# Patient Record
Sex: Female | Born: 1975 | Race: White | Hispanic: No | Smoking: Former smoker
Health system: Southern US, Community
[De-identification: ages and names within clinical notes are randomized; demographics above are authoritative.]

## PROBLEM LIST (undated history)

## (undated) VITALS — BP 92/54 | HR 70 | Temp 98.4°F | Resp 12 | Wt 153.0 lb

## (undated) DIAGNOSIS — F909 Attention-deficit hyperactivity disorder, unspecified type: Secondary | ICD-10-CM

## (undated) DIAGNOSIS — F419 Anxiety disorder, unspecified: Secondary | ICD-10-CM

## (undated) HISTORY — DX: Attention-deficit hyperactivity disorder, unspecified type: F90.9

## (undated) HISTORY — PX: EYE SURGERY: SHX253

## (undated) HISTORY — PX: NO PAST SURGERIES: SHX2092

## (undated) HISTORY — DX: Anxiety disorder, unspecified: F41.9

## (undated) MED ORDER — DEXMETHYLPHENIDATE HCL ER 35 MG OR CP24
EXTENDED_RELEASE_CAPSULE | ORAL | 0 refills | Status: AC
Start: 2020-07-23 — End: ?

## (undated) MED ORDER — AMPHETAMINE-DEXTROAMPHETAMINE 5 MG OR TABS
ORAL_TABLET | ORAL | 0 refills | Status: AC
Start: 2019-05-21 — End: ?

## (undated) MED ORDER — VYVANSE 70 MG OR CAPS
ORAL_CAPSULE | ORAL | 0 refills | Status: AC
Start: 2019-05-21 — End: ?

## (undated) MED ORDER — AMPHETAMINE-DEXTROAMPHETAMINE 5 MG OR TABS
ORAL_TABLET | ORAL | 0 refills | Status: AC
Start: 2020-07-23 — End: ?

## (undated) MED ORDER — PRENATAL RX OR TABS
ORAL_TABLET | ORAL | Status: DC
Start: 2000-06-07 — End: 2001-06-07

## (undated) MED ORDER — CHANTIX STARTING MONTH PAK 0.5 MG X 11 & 1 MG X 42 OR TBPK
ORAL_TABLET | ORAL | Status: AC
Start: 2012-04-08 — End: ?

## (undated) MED ORDER — AMPHETAMINE-DEXTROAMPHETAMINE 5 MG OR TABS
5.0000 mg | ORAL_TABLET | Freq: Every day | ORAL | 0 refills | Status: AC
Start: 2019-11-05 — End: ?

---

## 2000-06-07 ENCOUNTER — Encounter (INDEPENDENT_AMBULATORY_CARE_PROVIDER_SITE_OTHER): Payer: Self-pay | Admitting: Family Medicine

## 2000-06-07 ENCOUNTER — Ambulatory Visit (INDEPENDENT_AMBULATORY_CARE_PROVIDER_SITE_OTHER): Payer: BLUE CROSS/BLUE SHIELD | Admitting: Family Medicine

## 2000-06-07 LAB — CBC, DIFF,PLT
Absolute Eosinophil Count: 0.29 10*3/uL (ref 0–0.50)
Absolute Lymphocyte Count: 1.94 10*3/uL (ref 1.00–4.80)
Basophils: 0.11 10*3/uL (ref 0–0.20)
Hematocrit: 38 % (ref 36–45)
Hemoglobin: 13 g/dL (ref 11.5–15.5)
MCH: 30.9 pg (ref 27.3–33.6)
MCHC: 34.2 g/dL (ref 32.3–35.7)
MCV: 91 fL (ref 81–98)
Monocytes: 0.46 10*3/uL (ref 0–0.80)
Neutrophils: 2.92 10*3/uL (ref 1.80–7.00)
Platelet Count: 220 10*3/uL (ref 150–400)
Platelet Morphology: NORMAL
RBC: 4.19 mil/uL (ref 3.80–5.00)
WBC: 5.72 10*3/uL (ref 4.3–10.0)

## 2000-06-07 LAB — CHOLESTEROL (TOTAL & HDL)
Cholesterol/HDL Ratio: 3
HDL Cholesterol: 51 mg/dL (ref >40)
Total Cholesterol: 152 mg/dL (ref <200)

## 2000-06-07 LAB — PROTHROMBIN & PTT
Partial Thromboplastin Time: 36 s — ABNORMAL HIGH (ref 22–35)
Partial Thromboplastin X Mean: 1.2
Prothrombin INR: 1.2
Prothrombin Time Patient: 14.7 s (ref 10.7–15.6)

## 2000-06-07 NOTE — Progress Notes (Signed)
SUBJECTIVE:  Lisa Flynn is an 25 year old woman who presents for annual gyn exam.     Current concerns: Trying to get pregnant for 6 yrs  frequent bruising on LE's    GYN HISTORY:  Current contraception: Natural family planning  DES exposure: UNKNOWN  History of abnormal Pap smear: NO  Menstrual history: every 24 days  Family history of uterine or ovarian cancer: NO  Regular self breast exam: N/A  History of abnormal mammogram: NO  Family history of breast cancer: NO  History of abnormal lipids: NO  OB HISTORY: Obstetric History   The patient has not been asked about pregnancy.      There is no problem list on file for this patient.    Review of patient's past medical history indicates:       Comment: negative    Review of patient's past surgical history indicates:      Comment: negative    Review of patient's family history indicates:   Cancer NEG    Diabetes NEG    Heart NEG     Hypertension NEG       No current prescriptions on file.        Review of patient's allergies indicates no known allergies.    Social History   Marital Status: Married Spouse Name: Pavel    Years of Education: Number of children: 0     Social History Main Topics   Tobacco Use: Yes Packs/Day: .5 Years:     Alcohol Use: Yes    Comment: twice per week   Drug Use: Not Asked    Sexually Active: Yes Partners with: Female    Other Topics Concern   None on file    Social History Narrative   None on file        Review Of Systems  Ears/Nose/Throat: negative  Respiratory: negative  Cardiovascular: negative  Gastrointestinal: negative  Genitourinary: negative    OBJECTIVE:  Blood pressure 102/58, pulse 60, temperature 98.6, resp. rate 16, height 5\' 5"  (1.638m), weight 125 lbs (56.700 kg), last menstrual period 05/25/2000.  General appearance: healthy, alert, no distress  Skin: Skin color, texture, turgor normal. No rashes or concerning lesions.  Ears: External ears normal. Canals clear. TM's normal.  Nose/Sinuses: Nares normal. Septum midline.  Mucosa normal. No drainage or sinus tenderness.  Oropharynx: Lips, mucosa, and tongue normal. Teeth and gums normal.  Neck: Neck supple. No adenopathy. Thyroid symmetric, normal size, without nodules  Lungs:clear to auscultation  Heart: normal rate, regular rhythm and no murmurs, clicks, or gallops  Breasts: deferred  Abdomen: Abdomen soft, non-tender. BS normal. No masses or organomegaly  Pelvic: External genitalia normal, Vagina normal without discharge, Urethra without abnormality or discharge, cervix normal in appearance, no CMT, no bladder tenderness, uterus normal size, shape, and consistency, no adnexal masses or tenderness  Ext: Normal, without deformities, edema, or skin discoloration, radial and DP pulses 2+ bilaterally    ASSESSMENT:  Satisfactory annual gyn exam    ON:GEXBMWU 25 yr old     PLAN:  Pap smear: YES  Chlamydia screen: YES   Screening for other STD's including HIV: NO  Total nonfasting cholesterol: YES  Mammography: NO  TSH for hypothyroidism: NO  Colon Ca screening over 50y: Hemoccults: NO  Flexible Sigmoidoscopy NO    Immunizations:  Td: NO  Hepatitis B NO  MMR NO    Routine counseling ("yes" indicates topic was discussed):   Contraceptive needs: YES   Mentioned availability of  emergency contraception: NO   STD prevention: NO   MVI with folate if any chance will get pregnant: YES   Importance of regular dental care: NO   Cessation of tobacco use:YES   Low-fat, high-fiber diet: YES   Adequate Ca intake (1200-1500mg /day at this age): YES   Proper exercise: YES   Breast Self Exam teaching: NO   HRT discussion if perimenopausal: NO     Rx:  PNV    Return to clinic in one year for HCM exam.

## 2000-06-08 LAB — CHLAMYDIA BY LCR: Chlamydia Trachomatis DNA: NEGATIVE

## 2000-06-10 LAB — RUBELLA IMMUNE STATUS BY EIA
Rubella Antibody Level: >20 IU
Rubella Immune Status Result: POSITIVE

## 2000-06-13 LAB — CERVICAL CANCER SCREENING: Cytologic Impression: NEGATIVE

## 2001-05-01 ENCOUNTER — Ambulatory Visit (INDEPENDENT_AMBULATORY_CARE_PROVIDER_SITE_OTHER): Payer: PRIVATE HEALTH INSURANCE | Admitting: Family Medicine

## 2001-05-01 MED ORDER — NAPROSYN 500 MG OR TABS
ORAL_TABLET | ORAL | Status: DC
Start: 2001-05-01 — End: 2001-12-30

## 2001-05-01 MED ORDER — FLEXERIL 10 MG OR TABS
ORAL_TABLET | ORAL | Status: DC
Start: 2001-05-01 — End: 2001-12-30

## 2001-05-01 NOTE — Progress Notes (Signed)
SUBJECTIVE:  Three days of headache.  Constant.  From back of head, over the top.  Some posterior neck pain.   began wtih period three days ago.  Does not normally get headaches.      Denies nausea, vomiting, photophobia, parestheisa or weakness.  Does bend her head over a lot at work, working on Scientist, water quality at [employer].      No fever, chills, runn nose, cough, st.  No h/o migraine.  No FH of migraine.    There is no problem list on file for this patient.  Review of patient's past medical history indicates:                                                                    Comment: negative  OBJECTIVE:  Blood pressure 108/58, pulse 88, temperature 96.8, resp. rate 16, weight 129 lbs (58.514 kg), last menstrual period 04/27/2001.  Gen:NAD  HEENT: TMs clear, op clear, nasal mucosa pink and moist, conj. clear, perrl, eomi  neck: supple w/o adenopathy, she dose have some tight muscles posteriorly, FROM, no spinous process tenderness to palpation  card: RRR nomrg  lungs: CTA bilaterally  CN II-XII intact    ASSESSMENT:  Headache muscle tension    PLAN:  Trial of naprosyn, flexeril, stretching  fu one week if not improving.

## 2001-06-07 ENCOUNTER — Encounter (INDEPENDENT_AMBULATORY_CARE_PROVIDER_SITE_OTHER): Payer: Self-pay | Admitting: Family Medicine

## 2001-06-07 ENCOUNTER — Ambulatory Visit (INDEPENDENT_AMBULATORY_CARE_PROVIDER_SITE_OTHER): Payer: PRIVATE HEALTH INSURANCE | Admitting: Family Medicine

## 2001-06-07 MED ORDER — PRENATAL RX OR TABS
ORAL_TABLET | ORAL | Status: DC
Start: 2001-06-07 — End: 2007-06-14

## 2001-06-07 NOTE — Progress Notes (Signed)
SUBJECTIVE:  Lisa Flynn is an 26 year old woman who presents for annual gyn exam.     Current concerns: fertility referral    GYN HISTORY:  Current contraception: not needed  DES exposure: NO  History of abnormal Pap smear: NO  Menstrual history: monthly  Family history of uterine or ovarian cancer: NO  Regular self breast exam: NO  History of abnormal mammogram: NO  Family history of breast cancer: NO  History of abnormal lipids: NO  OB HISTORY: Obstetric History   The patient has not been asked about pregnancy.      There is no problem list on file for this patient.    Review of patient's past medical history indicates:     Comment: negative    Review of patient's past surgical history indicates:     Comment: negative    Review of patient's family history indicates:   Cancer NEG    Diabetes NEG    Heart NEG    Hypertension NEG       Current prescriptions:  NAPROSYN 500 MG OR TABS Take 1 tablet by mouth 2 times per day as needed for pain  FLEXERIL 10 MG OR TABS Take 1 tablet by mouth 3 times per day as needed for muscle pain  PRENATAL RX OR TABS 1 TABLET DAILY        Review of patient's allergies indicates not on file.    Social History   Marital Status: Married Spouse Name: Pavel    Years of Education: Number of children: 0     Social History Main Topics   Tobacco Use: Yes Packs/Day: .5 Years:    Alcohol Use: Yes    Comment: twice per week   Drug Use: Not Asked    Sexually Active: Yes Partners with: Female    Other Topics Concern   None on file    Social History Narrative   None on file        Review Of Systems  Ears/Nose/Throat: epistaxis  Respiratory: negative  Cardiovascular: negative  Gastrointestinal: negative  Genitourinary: negative    OBJECTIVE:  Blood pressure 117/60, pulse 86, temperature 99.8, temperature source Oral, resp. rate 20, weight 127 lbs (57.607 kg), last menstrual period 05/22/2001.  General appearance: healthy, alert, no distress  Skin: Skin color, texture, turgor normal. No rashes or  concerning lesions.  Ears: External ears normal. Canals clear. TM's normal.  Nose/Sinuses: Nares normal. Septum midline. Mucosa normal. No drainage or sinus tenderness.  Oropharynx: Lips, mucosa, and tongue normal. Teeth and gums normal.  Neck: Neck supple. No adenopathy. Thyroid symmetric, normal size, without nodules  Lungs:clear to auscultation  Heart: normal rate, regular rhythm and no murmurs, clicks, or gallops  Breasts: deferred  Abdomen: Abdomen soft, non-tender. BS normal. 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    PLAN:  Pap smear: YES  Chlamydia screen: NO   Screening for other STD's including HIV: NO  Total nonfasting cholesterol: NO  Mammography: NO  TSH for hypothyroidism: NO  Colon Ca screening over 50y: Hemoccults: NO  Flexible Sigmoidoscopy NO    Immunizations:  Td: NO  Hepatitis B NO  MMR NO    Routine counseling ("yes" indicates topic was discussed):   Contraceptive needs: YES   Mentioned availability of emergency contraception: NO   STD prevention: N/A   MVI with folate if any chance will get pregnant: YES   Importance of regular dental care: NO   Cessation of tobacco use:NO   Low-fat, high-fiber diet: YES   Adequate Ca intake (1200-1500mg /day at this age): YES   Proper exercise: YES   Breast Self Exam teaching: NO   HRT discussion if perimenopausal: NO     Rx:  pnv  referral to infertility    Return to clinic in one year for HCM exam.

## 2001-06-12 LAB — CERVICAL CANCER SCREENING: Cytologic Impression: NEGATIVE

## 2001-11-06 ENCOUNTER — Ambulatory Visit (INDEPENDENT_AMBULATORY_CARE_PROVIDER_SITE_OTHER): Payer: PRIVATE HEALTH INSURANCE | Admitting: Family Medicine

## 2001-11-06 VITALS — BP 108/64 | HR 80 | Temp 99.8°F | Resp 16 | Wt 122.0 lb

## 2001-11-06 MED ORDER — PHENERGAN 25 MG OR TABS
ORAL_TABLET | ORAL | Status: DC
Start: 2001-11-06 — End: 2001-12-30

## 2001-11-06 NOTE — Progress Notes (Signed)
SUBJECTIVE:  Patient woke up early this am having to vomit. She has vomited twice so far. Brown material. No frank blood. She feels mildly feverish, nauseated and dizzy. No vertigo. Diarrhea x one. No melena. She denies abdominal pain, st, cough, cp.     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  Review of patient's past surgical history indicates:      Comment: negative  Review of patient's family history indicates:   Cancer NEG    Diabetes NEG    Heart NEG    Hypertension NEG    Stroke Maternal Grandfather    Comment: stroke at 63    Social History   Marital Status: Married Spouse Name: Physiological scientist    Years of Education: Number of children: 0     Social History Main Topics   Tobacco Use: Yes Packs/Day: .5 Years:    Alcohol Use: Yes    Comment: twice per week   Drug Use: Not Asked    Sexually Active: Yes Partners with: Female    Other Topics Concern   None on file    Social History Narrative   None on file      OBJECTIVE:  Blood pressure 108/64, pulse 80, temperature 99.8, resp. rate 16, weight 122 lbs (55.339 kg), last menstrual period 10/30/2001. see extended vitals for orthostatics.  Gen:NAD  HEENT: TMS clear, op clear, conj.clear, op clear  neck: supple w/o adenopathy  card; RRR no mrg  lungs: CTA bilaterally  abd: soft, nontnder, no hsm  skin: no rash  rectal: heme negative    ASSESSMENT:  Probable viral gastroenteritis vs. food poisoning. Brown vomitus concerning for presence of blood but not orthostatic and otherwise hemodynamically stable    PLAN:  Phenergan for nausea  expect resolution of vomiting by tommorrow  call if worsening.  Patient left before we could get a cbc.

## 2001-12-30 ENCOUNTER — Ambulatory Visit (INDEPENDENT_AMBULATORY_CARE_PROVIDER_SITE_OTHER): Payer: PRIVATE HEALTH INSURANCE | Admitting: Family Medicine

## 2001-12-30 LAB — CBC, DIFF
% Basophils: 2 %
% Eosinophils: 3 %
% Lymphocytes: 25 %
% Monocytes: 9 %
% Neutrophils: 61 %
Absolute Eosinophil Count: 0.16 10*3/uL (ref 0–0.50)
Absolute Lymphocyte Count: 1.25 10*3/uL (ref 1.00–4.80)
Basophils: 0.08 10*3/uL (ref 0–0.20)
Hematocrit: 36 % (ref 36–45)
Hemoglobin: 12.1 g/dL (ref 11.5–15.5)
MCH: 31 pg (ref 27.3–33.6)
MCHC: 33.7 g/dL (ref 32.3–35.7)
MCV: 92 fL (ref 81–98)
Monocytes: 0.46 10*3/uL (ref 0–0.80)
Neutrophils: 3.14 10*3/uL (ref 1.80–7.00)
Platelet Count: 205 10*3/uL (ref 150–400)
RBC: 3.91 mil/uL (ref 3.80–5.00)
WBC: 5.09 10*3/uL (ref 4.3–10.0)

## 2001-12-30 LAB — THYROID SCREEN WITH TSH
T Uptake: 44 % (ref 28–54)
Thyroid Stimulating Hormone: 0.41 u[IU]/mL (ref 0.4–5.0)
Thyroxine (T4): 8.6 ug/dL (ref 4.8–10.8)

## 2001-12-30 MED ORDER — NAPROSYN 500 MG OR TABS
ORAL_TABLET | ORAL | Status: DC
Start: 2001-12-30 — End: 2003-01-17

## 2001-12-30 MED ORDER — FLEXERIL 10 MG OR TABS
ORAL_TABLET | ORAL | Status: DC
Start: 2001-12-30 — End: 2002-01-08

## 2001-12-30 MED ORDER — OMEPRAZOLE CPCR 20 MG OR
EXTENDED_RELEASE_CAPSULE | ORAL | Status: DC
Start: 2001-12-30 — End: 2006-02-02

## 2001-12-30 NOTE — Progress Notes (Signed)
Addended by: Jobe Igo B on: 12/30/2001 11:53:37 AM     Modules accepted: Orders    SUBJECTIVE:  Patient c/o the feeling like there is something in her throat x 2 weeks. She has difficulty swallowing liquids. Feels like they are going around a mass at the level of the adams apple. No difficulty with solids. NO vomiting, regurgitation, diarrhea.     No cough, or sob.   She does have a stuffy nose. NO sneezing.  She c/o intermittent heart burn.  She still gets one sided tension headaches few times per week but is still not interested in a daily medication.   She has lost 4 pounds in past three weeks.    She smokes 1 and a half packs in two days  Her mom had a thyroid condition    There is no previous medical history on file.    OBJECTIVE:  Blood pressure 102/60, pulse 60, temperature 97.8, resp. rate 16, weight 122 lbs (55.339 kg), last menstrual period 12/23/2001.  gen:NAD  HEENT: TMs cleawr, nasal mucosa pink and moist and slightly swollen on the left side, conj. clear, perrl, no proptosis, op clear  neck: supple, there is a mild fullness over the thyroid which feels slightly diffusely enlarged, no adenopathy    ASSESSMENT:  Dysphagia for liquids with possible thyroid enlargement. Need to r/o mass. Also cannot r/o GERD given intermittent heart burn symtpoms    PLAN:  Check below labs  schedule [place] thryoid  trial of omeprazole  f/u after scan.

## 2002-01-02 ENCOUNTER — Telehealth (INDEPENDENT_AMBULATORY_CARE_PROVIDER_SITE_OTHER): Payer: Self-pay | Admitting: Family Medicine

## 2002-01-02 NOTE — Telephone Encounter (Signed)
>>   Lisa SONNENBURG Tue Jan 02, 2002 5:19 PM  Informed pt of appt time. Pt is unable to go at that time--rescheduled pt to get U/S on 10.03.03 at 3:15pm.    >> [name] Tue Jan 02, 2002 4:41 PM  Pt states that she is returning Lisa's call and can be reached at [phone] and requests a call back. TSR tried Lisa's ext, no answer.    >> [facility] Tue Jan 02, 2002 2:35 PM  Left message for pt to call clinic. The latest appt I could schedule for her was at 4:00pm with a 3:45 check in time--[facility]--09.29.03    >> KIM F NESBITT Tue Jan 02, 2002 8:49 AM  Vevelyn Pat pt    GENERAL MESSAGE  ------------------  Main reason for the call: Pt states she's returning a call to the clinic regarding an appt for an [place] at [facility]. Pls call pt to advise.  Preferred phone number: [phone]  Call back expected: YES  Okay to leave a VM or msg with someone? YES

## 2002-01-10 ENCOUNTER — Encounter (INDEPENDENT_AMBULATORY_CARE_PROVIDER_SITE_OTHER): Payer: Self-pay | Admitting: Family Medicine

## 2002-01-10 NOTE — Telephone Encounter (Signed)
>>   TINA RUDDELL Wed Jan 10, 2002 11:44 AM  R/C and provided insurance info.      >> SIU LEE Wed Jan 10, 2002 11:12 AM  Left message for Maxine Glenn to call back.    >> Jen[place] Corner Wed Jan 10, 2002 10:50 AM  Vevelyn Pat pt    GENERAL MESSAGE  ------------------  Main reason for the call: Monica from [place] states she does not have pt current insurance information and is requesting a call back with that info.  Preferred phone number: [phone]  Call back expected: YES  Okay to leave a VM or msg with someone? YES

## 2002-01-15 ENCOUNTER — Telehealth (INDEPENDENT_AMBULATORY_CARE_PROVIDER_SITE_OTHER): Payer: Self-pay | Admitting: Family Medicine

## 2002-01-15 NOTE — Telephone Encounter (Signed)
>>   [name] [facility] Mon Jan 15, 2002 3:06 PM  Left detailed message regarding below on pt's cell phone.    >> Oda Kilts ZOX Jan 15, 2002 12:58 PM  Please let patient know that her [place] results are back. Shows just a mildly enlarged thyroid. Please have her schedule a f/u appt.

## 2002-01-22 ENCOUNTER — Ambulatory Visit (INDEPENDENT_AMBULATORY_CARE_PROVIDER_SITE_OTHER): Payer: PRIVATE HEALTH INSURANCE | Admitting: Family Medicine

## 2002-01-22 VITALS — BP 108/70 | HR 76 | Temp 98.8°F | Resp 16 | Wt 122.0 lb

## 2002-01-22 NOTE — Progress Notes (Signed)
Addended by: Jobe Igo B on: 01/23/2002 10:35:05 AM     Modules accepted: Orders    SUBJECTIVE:  Patient returns today for f/u of feeling of a lump in her throat. She states that when she swallows saliva, she feels like it is temporarily stuck and takes a little longer to go down. She does not feel this when drinking liquids or swallowing solids. NO GERD symptoms. NO regurgitation.    No fever, chills, weight loss,palpitations, dizziness    she has not taken the omeprazole    OBJECTIVE:  Blood pressure 108/70, pulse 76, temperature 98.8, resp. rate 16, weight 122 lbs (55.339 kg), last menstrual period 01/15/2002.  Gen:NAD  Thyroid gland still appears diffusely enlarged  [place] confirms    ASSESSMENT:  Goiter but question whether responsible for feeling in throat    PLAN:  Check free t4 and free t3  she will try the prilosec for two weeks and return then to see if there is any subclinical reflux.

## 2002-01-23 LAB — T4, FREE: Thyroxine (Free): 1.2 ng/dL (ref 0.8–1.8)

## 2002-01-25 LAB — T3 (FREE): T3 (Free): 3.6 pg/mL (ref 2.2–4.0)

## 2002-01-26 ENCOUNTER — Telehealth (INDEPENDENT_AMBULATORY_CARE_PROVIDER_SITE_OTHER): Payer: Self-pay | Admitting: Family Medicine

## 2002-02-07 ENCOUNTER — Ambulatory Visit (INDEPENDENT_AMBULATORY_CARE_PROVIDER_SITE_OTHER): Payer: PRIVATE HEALTH INSURANCE | Admitting: Family Medicine

## 2002-02-07 VITALS — BP 114/62 | HR 72 | Temp 98.0°F | Resp 16 | Wt 122.0 lb

## 2002-02-07 LAB — PR U/A AUTO W/MICRO, ONSITE
Bilirubin (Indirect): NEGATIVE
Casts, URN: NEGATIVE /LPF
Crystals, URN: NEGATIVE /LPF
Epithelial Cells, URN: NEGATIVE /LPF
Glucose: NEGATIVE mg/dL
Ketones, URN: NEGATIVE mg/dL
Nitrite, URN: NEGATIVE
Protein: NEGATIVE mg/dL
Specific Gravity, URN: <1.005 (ref 1.005–1.030)
Urobilinogen, URN: NORMAL E.U./dL
pH, Whole Blood: 5 (ref 5.0–8.0)

## 2002-02-07 LAB — PR URINE PREGNANCY TEST HCG, ONSITE: Prelim. Pregnancy (HCG), SRM: NEGATIVE

## 2002-02-07 MED ORDER — PYRIDIUM 200 MG OR TABS
ORAL_TABLET | ORAL | Status: DC
Start: 2002-02-07 — End: 2002-02-12

## 2002-02-07 MED ORDER — MACROBID 100 MG OR CAPS
ORAL_CAPSULE | ORAL | Status: DC
Start: 2002-02-07 — End: 2002-02-13

## 2002-02-07 NOTE — Progress Notes (Signed)
This 26 year old female presents for evaluation of pain and blood in urine at the end of urination. No frqequency. She is late with her period and also says her one boyfriend is "not trust worthy".  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  outpatient prescriptions as of 02/07/02:  NAPROSYN 500 MG OR TABS Take 1 tablet by mouth 2 times per day as needed for pain,,,  OMEPRAZOLE CPCR 20 MG OR Take 1 capsule by mouth every day,,,  PRENATAL RX OR TABS 1 TABLET DAILY,,,        Review of patient's allergies indicates no known allergies.  History sections of chart reviewed and updated today: YES    Recent antibiotics: No   Previous cystitis: No  Previous pyelonephritis: Yes: a few times  Other bladder/kidney problems: No  Diabetes: No  Date of last STD check: Wants testing today       OBJECTIVE:  Appearance:no acute distress  CVAT: No  Abdomen: exam deferred  Pelvic exam: deferred    Labs: Stat UA ordered. Gc/Chlam at patient request.    Patient education: UTI symptoms/etiology/treatment discussed.    Risks and side effects of medication(s) were discussed. Pt advised to read written information provided by the pharmacist:YES

## 2002-02-09 LAB — URINE C/S

## 2002-02-09 LAB — GC&CHLAM NUCLEIC ACID DETECTN

## 2002-02-12 ENCOUNTER — Telehealth (INDEPENDENT_AMBULATORY_CARE_PROVIDER_SITE_OTHER): Payer: PRIVATE HEALTH INSURANCE | Admitting: Family Medicine

## 2002-02-12 LAB — PR U/A MICRO ONLY, ONSITE
Bacteria: NEGATIVE /HPF
Casts, URN: NEGATIVE /LPF
Crystals, URN: NEGATIVE /LPF
Other: NEGATIVE

## 2002-02-12 MED ORDER — PYRIDIUM 200 MG OR TABS
ORAL_TABLET | ORAL | Status: DC
Start: 2002-02-12 — End: 2002-02-14

## 2002-02-12 MED ORDER — CIPROFLOXACIN TABS 500 MG OR
ORAL_TABLET | ORAL | Status: DC
Start: 2002-02-12 — End: 2002-02-25

## 2002-02-12 NOTE — Telephone Encounter (Signed)
>>   Lisa Flynn Mon Feb 12, 2002 1:32 PM  still having full stream dysuria. NO frequency, urgency back pain, nausea, fever. Change to cipro, refill macrobid, cx urine. f/u in 3 days if not improving.    >> [name] [facility] Mon Feb 12, 2002 1:24 PM  Pt complaining of pain with urination. Wanting refill of Pyridium. Per Dr. Vevelyn Pat need ua on pt. Pt left urine.

## 2002-02-14 LAB — URINE C/S

## 2003-01-17 ENCOUNTER — Ambulatory Visit (INDEPENDENT_AMBULATORY_CARE_PROVIDER_SITE_OTHER): Payer: PRIVATE HEALTH INSURANCE | Admitting: Family Practice

## 2003-01-17 VITALS — BP 112/60 | HR 72 | Temp 99.8°F | Resp 16 | Wt 117.0 lb

## 2003-01-17 MED ORDER — NAPROSYN 500 MG OR TABS
ORAL_TABLET | ORAL | Status: DC
Start: 2003-01-17 — End: 2003-06-12

## 2003-01-17 NOTE — Progress Notes (Signed)
Addended by: Eustace Pen on: 01/21/2003 9:00:31 AM     Modules accepted: Orders    Lisa Flynn is a 28 yo female here for evaluation of nose bleeds.     S: Lisa Flynn has a two year hx of frequent nose bleeds. Had been occurring less frequently until this past month when they began to occur nearly daily again. Yesterday it happened four times and today two. Each bleed occurs spontaneously without any known precipitant. Usually bleeds for about 10-20 minutes at a time. She puts pressure on the nares and leans forward to staunch the bleeding. This has always worked eventually. She denies congestion, cough, rhinorrhea, sore throat or ear pain. Has been feeling rather fatigued lately. Denies fever, chills, night sweats, weight loss, nausea.     Requests refill of Naprosyn which she takes for intermittent HA's.     There is no previous medical history on file.  Review of patient's past surgical history indicates:      Comment: negative    Review of patient's family history indicates:   Cancer NEG    Diabetes NEG    Heart NEG    Hypertension NEG    Stroke Maternal Grandfather    Comment: stroke at 62      O: General: Alert, NAD   Blood pressure 112/60, pulse 72, temperature 99.8, temperature source Oral, resp. rate 16, weight 117 lbs (53.071 kg).   HEENT: Pupils equal, reactive to light and accomodation. Sclerae and conjunctivae clear. Fundi benign. External ears and canals clear. TM's normal bilaterally. Nose with slight inflamation of nasal mucosa, no visible blood or crusts seen. Gums and dentition normal. No sinus tenderness. Oropharynx normal. Neck supple without palpable adenopathy. No thyroid enlargemant or discrete nodule.   LUNGS: The lungs are clear to auscultation.   CV: Regular rate and rhythm. S1 and S2 normal, no murmurs, clicks, gallops or rubs.    A: Recurrent epistaxis, no visible source upon exam today.    p: Refer to ENT. Check CBC to r/o anemia. Refilled Naprosyn.

## 2003-01-18 LAB — CBC, DIFF
Absolute Eosinophil Count: 0.06 10*3/uL (ref 0–0.50)
Absolute Lymphocyte Count: 2.88 10*3/uL (ref 1.00–4.80)
Hematocrit: 38 % (ref 36–45)
Hemoglobin: 12.8 g/dL (ref 11.5–15.5)
MCH: 29.9 pg (ref 27.3–33.6)
MCHC: 33.6 g/dL (ref 32.3–35.7)
MCV: 89 fL (ref 81–98)
Monocytes: 0.38 10*3/uL (ref 0–0.80)
Neutrophils: 2.94 10*3/uL (ref 1.80–7.00)
Platelet Count: 210 10*3/uL (ref 150–400)
RBC: 4.28 mil/uL (ref 3.80–5.00)
WBC: 6.26 10*3/uL (ref 4.3–10.0)

## 2003-06-12 ENCOUNTER — Telehealth (INDEPENDENT_AMBULATORY_CARE_PROVIDER_SITE_OTHER): Payer: Self-pay | Admitting: Family Medicine

## 2003-06-12 ENCOUNTER — Ambulatory Visit (INDEPENDENT_AMBULATORY_CARE_PROVIDER_SITE_OTHER): Payer: PRIVATE HEALTH INSURANCE | Admitting: Family Medicine

## 2003-06-12 MED ORDER — FLEXERIL 10 MG OR TABS
ORAL_TABLET | ORAL | Status: AC
Start: 2003-06-12 — End: 2003-06-21

## 2003-06-12 MED ORDER — NAPROSYN 500 MG OR TABS
ORAL_TABLET | ORAL | Status: DC
Start: 2003-06-12 — End: 2006-02-02

## 2003-06-12 NOTE — Progress Notes (Signed)
SUBJECTIVE:  Patient comes in requesting refill of naprosyn and flexeril for tension HAs. She uses the flexeril sparingly. She is stressed at home. Her husband in addicted to methamphetamine and refusing help. She is contemplating divorce.     OBJECTIVE:  BP 114/62   Pulse 74   Resp 16   Wt 120 lbs (54.432 kg)   LMP 05/18/2003  Gen:NAD  Neck: supple      ASSESSMENT:  Tension HA    PLAN:  Ok to continue naprosyn and nightly flexeril  f/u prn or if pattern worsening.

## 2003-06-12 NOTE — Telephone Encounter (Signed)
>>   DANIEL SULLIVAN Wed Jun 12, 2003 4:42 PM  Pt scheduled appt for today.    >> Oda Kilts Wed Jun 12, 2003 2:06 PM  should be seen    >> Marianne Sofia Wed Jun 12, 2003 2:01 PM  Please see below and advise.  Appt needed ?  Thank You.      >> Webb Laws Wed Jun 12, 2003 1:01 PM  Pt of Oda Kilts, MD.    REFILL CALL  PCP: Oda Kilts, MD  Prescribing Provider: Oda Kilts, MD  Medication(s) requested: Naprosyn (500 MG) and muscle relaxer   Dose taking now: see above   Reason for request: patient is out   Written prescription needed? NO  To Pharmacy Name: [facility]  Pharmacy location:[place] - across [address]  Pharmacy phone #:unknown by caller  Preferred phone #: [phone]  Okay leave VM or msg with someone?YES    TSR - Please inform patient that refills typically take 1-2 business days and to call their pharmacy to verify status. y

## 2003-06-28 ENCOUNTER — Ambulatory Visit (INDEPENDENT_AMBULATORY_CARE_PROVIDER_SITE_OTHER): Payer: PRIVATE HEALTH INSURANCE | Admitting: Family Medicine

## 2003-06-28 VITALS — BP 116/66 | HR 74 | Temp 98.8°F | Resp 16 | Wt 120.0 lb

## 2003-06-28 LAB — CBC, DIFF
% Basophils: 2 %
% Eosinophils: 6 %
% Lymphocytes: 22 %
% Monocytes: 7 %
% Neutrophils: 63 %
Absolute Eosinophil Count: 0.33 10*3/uL (ref 0–0.50)
Absolute Lymphocyte Count: 1.26 10*3/uL (ref 1.00–4.80)
Basophils: 0.13 10*3/uL (ref 0–0.20)
Hematocrit: 38 % (ref 36–45)
Hemoglobin: 12.7 g/dL (ref 11.5–15.5)
MCH: 30.2 pg (ref 27.3–33.6)
MCHC: 33.8 g/dL (ref 32.3–35.7)
MCV: 89 fL (ref 81–98)
Monocytes: 0.41 10*3/uL (ref 0–0.80)
Neutrophils: 3.65 10*3/uL (ref 1.80–7.00)
Platelet Count: 204 10*3/uL (ref 150–400)
RBC: 4.22 mil/uL (ref 3.80–5.00)
WBC: 5.78 10*3/uL (ref 4.3–10.0)

## 2003-06-28 MED ORDER — ALLEGRA 180 MG OR TABS
ORAL_TABLET | ORAL | Status: DC
Start: 2003-06-28 — End: 2006-02-02

## 2003-06-28 NOTE — Progress Notes (Signed)
S:Pt reports fatigue and dizziness for last week. Has been sleeping 12 hours/night, wakes up refreshed but is tired again early in the day. Dizziness is occurring multiple times/day with some double vision, this lasts a few seconds each time. With episode yesterday she felt faint, checked her blood sugar with mother's glucometer, value was 68. She has frontal HA X2 days for which pain meds have not helped. Reports decreased concentration, has found herself repeating statements to a co-worker. Has felt unco-ordinated also, with some falls. States mood, appetite good, denies stress at work or home. Reports some nasal congestion. NO fever, chills, SOB, CP, sore throat, itchy eyes,numbness, localized weakness.  O:  BP 116/66   Pulse 74   Temp (Src) 98.8 (Oral)   Resp 16   Wt 120 lbs (54.4kg)   LMP 06/18/2003  Gen: well-developed female, alert, interactive, NAD  HEENT: TMs clear,PERRLA, EOMI, nasal congestion with clear discharge, posterior oropharynx mildly erythematous, no maxillary or ethmoid sinus tenderness to palpation.  Neck: supple, no lymphadenopathy  CV: rrr, no mrg  Pulm: CTA bilaterally  Neuro: speech fluid, thought process logical and linear   CN II-XII grossly intact   sensation: intact to light touch throughout   Strength: 5/5 in all muscle groups   Gait normal, FTN normal  A: 1. viral illness vs anemia vs thyroid abnormality vs depression vs ?   2. allergic rhinitis  P: 1. if viral, this should abate within another week, labs drawn today, see below, return visit for lab results, further evaluation   2. Allegra 180mg  po qd during allergy season    Seen and evaluated with Rennis Harding MSIII

## 2003-06-29 LAB — COMPREHENSIVE METABOLIC PANEL
ALT (GPT): 15 U/L (ref 8–49)
AST (GOT): 20 U/L (ref 10–44)
Albumin: 4.4 g/dL (ref 3.5–5.2)
Alkaline Phosphatase (Total): 51 U/L (ref 25–100)
Anion Gap: 8 (ref 5–15)
Bilirubin (Total): 0.8 mg/dL (ref 0.2–1.3)
Calcium: 9.5 mg/dL (ref 8.9–10.2)
Carbon Dioxide, Total: 28 mEq/L (ref 22–32)
Chloride: 106 mEq/L (ref 98–108)
Creatinine: 0.8 mg/dL (ref 0.3–1.2)
Glucose: 82 mg/dL (ref 62–125)
Potassium: 4.2 mEq/L (ref 3.7–5.2)
Protein (Total): 7.2 g/dL (ref 6.0–8.2)
Sodium: 142 mEq/L (ref 136–145)
Urea Nitrogen: 9 mg/dL (ref 8–21)

## 2003-06-29 LAB — THYROID STIMULATING HORMONE: Thyroid Stimulating Hormone: 0.47 u[IU]/mL (ref 0.4–5.0)

## 2003-07-08 ENCOUNTER — Ambulatory Visit (INDEPENDENT_AMBULATORY_CARE_PROVIDER_SITE_OTHER): Payer: Self-pay | Admitting: Family Medicine

## 2004-06-01 ENCOUNTER — Ambulatory Visit (INDEPENDENT_AMBULATORY_CARE_PROVIDER_SITE_OTHER): Payer: PRIVATE HEALTH INSURANCE | Admitting: Family Medicine

## 2004-06-01 ENCOUNTER — Encounter (INDEPENDENT_AMBULATORY_CARE_PROVIDER_SITE_OTHER): Payer: Self-pay | Admitting: Family Medicine

## 2004-06-01 VITALS — BP 118/78 | HR 84 | Temp 98.2°F | Resp 16 | Ht 64.5 in | Wt 128.0 lb

## 2004-06-01 LAB — PR URINE PREGNANCY TEST HCG, ONSITE: Prelim. Pregnancy (HCG), SRM: NEGATIVE

## 2004-06-01 MED ORDER — DEPO-PROVERA 150 MG/ML IM SUSP
INTRAMUSCULAR | Status: DC
Start: 2004-06-01 — End: 2006-01-05

## 2004-06-01 NOTE — Progress Notes (Signed)
Addended by: Ovidio Hanger on: 06/02/2004 11:44:23 AM     Modules accepted: Orders, Level of Service      SUBJECTIVE:  Lisa Flynn is an 29 year old woman who presents for annual gyn exam.     Current concerns: birth control.    GYN HISTORY:  Current contraception: none  DES exposure: NO  History of abnormal Pap smear: NO  Menstrual history: 24 day cycle, 3 days of bleeding  Family history of uterine or ovarian cancer: NO  Regular self breast exam: N/A  History of abnormal mammogram: NO  Family history of breast cancer: NO  History of abnormal lipids: NO  OB HISTORY: Obstetric History   The patient has not been asked about pregnancy.      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    Review of patient's past surgical history indicates:     Comment: negative    Review of patient's family history indicates:   Cancer No Hx Of    Diabetes No Hx Of    Heart No Hx Of    Hypertension No Hx Of    Stroke Maternal Grandfather    Comment: stroke at 59      Current outpatient prescriptions:   ALLEGRA 180 MG OR TABS, Take 1 tablet by mouth daily for allergies, Disp: 30, Rfl: 3  NAPROSYN 500 MG OR TABS, Take 1 tablet by mouth 2 times per day as needed for pain, Disp: 60, Rfl: 5  OMEPRAZOLE CPCR 20 MG OR, Take 1 capsule by mouth every day, Disp: 30, Rfl: 0  PRENATAL RX OR TABS, 1 TABLET DAILY, Disp: 30, Rfl: 12        Review of patient's allergies indicates:    No Known Allergies.    Social History   Marital Status: Married Spouse Name: Pavel    Years of Education: Number of children: 0     Occupational History  Occupation Curator     Social History Main Topics   Tobacco Use: Yes Packs/Day: .5 Years:    Alcohol Use: Yes    Comment: twice per week   Drug Use: Not on file    Sexual Activity: Yes Partners with: Female    Other Topics Concern   None on file    Social History Narrative   None on file        Review Of Systems  Ears/Nose/Throat:  negative  Respiratory: negative  Cardiovascular: negative  Gastrointestinal: negative  Genitourinary: negative    OBJECTIVE:  Blood pressure 118/78, pulse 84, temperature 98.2, temperature source Oral, resp. rate 16, height 5' 4.5" (1.64 m), weight 128 lbs (58.1 kg), last menstrual period 05/22/2004.  General appearance: healthy, alert, no distress  Skin: Skin color, texture, turgor normal. No rashes or concerning lesions.  Ears: External ears normal. Canals clear. TM's normal.  Nose/Sinuses: Nares normal. Septum midline. Mucosa normal. No drainage or sinus tenderness.  Oropharynx: Lips, mucosa, and tongue normal. Teeth and gums normal., posterior pharynx without erythema or drainage  Neck: Neck supple. No adenopathy. Thyroid symmetric, normal size, without nodules  Lungs:clear to auscultation  Heart: normal rate, regular rhythm and no murmurs, clicks, or gallops  Breasts: deferred  Abdomen: Abdomen soft, non-tender. BS normal. No masses or organomegaly  Pelvic: External genitalia normal, Vagina normal without discharge, Urethra without abnormality or discharge, cervix normal in appearance, no CMT, no bladder tenderness,  uterus normal size, shape, and consistency, no adnexal masses or tenderness  Ext: Normal, without deformities, edema, or skin discoloration, radial and DP pulses 2+ bilaterally    ASSESSMENT:  Satisfactory annual gyn exam    IO:[id number] management    PLAN:  Pap smear: YES  Chlamydia screen: YES   Screening for other STD's including HIV: NO  Total nonfasting cholesterol: YES  Mammography: NO  TSH for hypothyroidism: NO  Colon Ca screening over 50y: Hemoccults: NO  Flexible Sigmoidoscopy NO    Immunizations:  Td: NO  Hepatitis B NO  MMR NO    Routine counseling ("yes" indicates topic was discussed):   Contraceptive needs: YES   Mentioned availability of emergency contraception: N/A   STD prevention: YES   MVI with folate if any chance will get pregnant: N/A   Importance of regular dental care:  NO   Cessation of tobacco use:N/A   Low-fat, high-fiber diet: YES   Adequate Ca intake (1200-1500mg /day at this age): YES   Proper exercise: YES   Breast Self Exam teaching: NO   HRT discussion if perimenopausal: NO     Rx:  depo    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

## 2004-06-02 LAB — BASIC METABOLIC PANEL
Anion Gap: 9 (ref 5–15)
Calcium: 9.6 mg/dL (ref 8.9–10.2)
Carbon Dioxide, Total: 27 mEq/L (ref 22–32)
Chloride: 109 mEq/L — ABNORMAL HIGH (ref 98–108)
Creatinine: 0.9 mg/dL (ref 0.3–1.2)
Glucose: 92 mg/dL (ref 62–125)
Potassium: 4.3 mEq/L (ref 3.7–5.2)
Sodium: 145 mEq/L (ref 136–145)
Urea Nitrogen: 8 mg/dL (ref 8–21)

## 2004-06-02 LAB — CHOLESTEROL (TOTAL & HDL)
Cholesterol/HDL Ratio: 2.1
HDL Cholesterol: 69 mg/dL (ref >40)
Total Cholesterol: 147 mg/dL (ref <200)

## 2004-06-02 LAB — GC&CHLAM NUCLEIC ACID DETECTN

## 2004-06-09 LAB — CERVICAL CANCER SCREENING: Cytologic Impression: NEGATIVE

## 2005-02-01 ENCOUNTER — Ambulatory Visit (INDEPENDENT_AMBULATORY_CARE_PROVIDER_SITE_OTHER): Payer: PRIVATE HEALTH INSURANCE | Admitting: Family Medicine

## 2005-02-01 VITALS — BP 118/68 | HR 76 | Resp 16 | Wt 146.0 lb

## 2005-02-01 LAB — PR URINE PREGNANCY TEST HCG, ONSITE: Prelim. Pregnancy (HCG), SRM: NEGATIVE

## 2005-02-01 NOTE — Progress Notes (Signed)
SUBJECTIVE:  Two weeks late on her period. Usually 24 day cycles. Does get quite a lot of pain with menstruation. Sisters have endometriosis. Trying to conceive. Has been trying for about three months. Her current partner has conceived with his old partner who subsequently had miscarriages. Lisa Flynn has never used birth control consistently. She did take on injectio of depo over a year ago.     She denies breast tenderness, nausea, cramping.    She took one preg test at home which was positive but followed with two others which were negative.    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 Continued efforts to conceive. She has been doing basal body temps and is getting midcycle spikes. Consider fertility w/u in three months if not pregnant.

## 2005-02-02 LAB — THYROID STIMULATING HORMONE: Thyroid Stimulating Hormone: 1.969 u[IU]/mL (ref 0.400–5.00)

## 2005-08-09 ENCOUNTER — Ambulatory Visit (INDEPENDENT_AMBULATORY_CARE_PROVIDER_SITE_OTHER): Payer: PRIVATE HEALTH INSURANCE | Admitting: Physician Assistant

## 2005-08-09 VITALS — BP 114/68 | HR 60 | Temp 97.6°F | Resp 12 | Wt 151.4 lb

## 2005-08-09 MED ORDER — ALPRAZOLAM 0.5 MG OR TABS
ORAL_TABLET | ORAL | Status: DC
Start: 2005-08-09 — End: 2006-02-02

## 2005-08-09 NOTE — Progress Notes (Signed)
SUBJECTIVE:    Lisa Flynn is a 30 year old year old female who presents to the clinic for the following:    1. Concerned for something like panic attack x 2 weeksn that occurs twice a week. Usually when she thinks about something work related. Gets a funny feeling in chest w/ difficulty breathing. Bad one happened while she was driving recently. Denies hx of similar episode. Pt sits down and tries to think about something else and breathes deeply. Pt states she feels like heart is skipping a beat when this happens. Etoh once a week. Pt is a smoker.       No past medical history on file.  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    Medication list is reviewed & updated.      Allergies: none    OBJECTIVE:    VS BP 114/68   Pulse 60   Temp (Src) 97.6 F (36.4 C) (Oral)   Resp 12   Wt 151 lbs 6.4 oz (68.675 kg)   LMP 08/06/2005  General appearance: healthy, alert, no distress, well groomed, neatly dressed. Normal tone, vol., velocity of speech. No tangential thinking.  Skin: Good color  HEENT: Eyes: lids appear normal, no swelling or erythema, PERRLA, EOM's intact and sclerae white. Oropharynx: normal. Neck: supple and no adenopathy  Lungs: clear to auscultation  CVS: regular rate and rhythm, no murmurs, gallops or rubs    ASSESSMENT:  1. Stress react, emotional (primary encounter diagnosis)     PLAN:  1. D/w pt behavioral modification coping mechanisms, ie deep breathing, imaging. Also d/w pt med tx for acute episodes w/ benzodiazepines and how they are not to be taken regularly. Rx for Xanax and explicitly reminded pt that if she begins to need these regularly, ie rf before 2 months, she should rtc and we would consider something more appropriate for long term, ie ssri's or propranolol. The patient indicates understanding of these issues and agrees with the plan.

## 2005-12-01 ENCOUNTER — Encounter (INDEPENDENT_AMBULATORY_CARE_PROVIDER_SITE_OTHER): Payer: Self-pay | Admitting: Internal Medicine

## 2005-12-01 ENCOUNTER — Ambulatory Visit (INDEPENDENT_AMBULATORY_CARE_PROVIDER_SITE_OTHER): Payer: PRIVATE HEALTH INSURANCE | Admitting: Internal Medicine

## 2005-12-01 VITALS — BP 100/60 | HR 84 | Temp 98.8°F | Resp 12 | Wt 143.0 lb

## 2005-12-01 LAB — PR URINE PREGNANCY TEST HCG, ONSITE: Prelim. Pregnancy (HCG), SRM: POSITIVE

## 2005-12-01 NOTE — Progress Notes (Signed)
CC: Positive home pregnancy test  HPI: Lisa Flynn is a 30 year old female here to confirm pregnancy and ask what is next. Has been trying to get pregant for many years.  LMP 10/26/22 and normal periods are every 24 days.  Did home pregnancy test which was positive x 2 on 2 separate days. Here for confirmation.  Having some cramping - not excessive.  Not sure if this is normal.  No spotting. No nausea/vomiting.    Taking no medications except prenatal multivitamin.   No Past medical history.  NKDA    Physical Exam:  BP 100/60  Pulse 84  Temp (Src) 98.8 F (37.1 C) (Oral)  Resp 12  Wt 143 lbs (64.864 kg)  LMP 10/25/2005  General: alert, NAD  HEENT: AT/NC  PERRL  MMM    Neck supple, no LAD  Lungs: clear to auscultation bilaterally  CV: regular rate and rhythm no murmurs rubs or gallops   Abd: soft, ND, +BS.  Mildly tender in suprapubic region. No rebound/guarding.  Ext: no edema    A/P:  1. Postive pregnancy test: Patient advised to schedule first OB appointment.  Stated she wishes to use provider at this clinic - Dr. Farris Has and Dr. Benny Lennert names provided. Advised that first appointment is usually at 10 weeks, she may make sooner appointment if she has other concerns.

## 2005-12-22 ENCOUNTER — Encounter (INDEPENDENT_AMBULATORY_CARE_PROVIDER_SITE_OTHER): Payer: Self-pay | Admitting: Family Medicine

## 2006-01-05 ENCOUNTER — Encounter (INDEPENDENT_AMBULATORY_CARE_PROVIDER_SITE_OTHER): Payer: Self-pay | Admitting: Family Medicine

## 2006-01-05 ENCOUNTER — Ambulatory Visit (INDEPENDENT_AMBULATORY_CARE_PROVIDER_SITE_OTHER): Payer: PRIVATE HEALTH INSURANCE | Admitting: Family Medicine

## 2006-01-05 VITALS — BP 102/58 | HR 84 | Temp 98.8°F | Resp 20 | Wt 152.1 lb

## 2006-01-05 LAB — PR U/A OB (PROT/GLU DIPSTICK), ONSITE
Glucose: NEGATIVE mg/dL
Protein: NEGATIVE mg/dL

## 2006-01-05 MED ORDER — COLACE 100 MG OR CAPS
ORAL_CAPSULE | ORAL | Status: DC
Start: 2006-01-05 — End: 2008-05-15

## 2006-01-05 NOTE — Progress Notes (Signed)
INITIAL OB HISTORY/PE    Have you received prenatal care during this pregnancy at another clinic? NO    Planned pregnancy? YES  Attitude towards pregnancy: happy  relationship of FOB to [place]: significant other of about 2 years, living together.  Very supportive  See OB Smart Forms and Specialty/OB Hx for additional information.    Initially had some depressed mood and nausea, but both have improved signficantly in the last few weeks.    Pertinent ROS:   CONST: Denies, daytime somnolence, unexpected weight loss, unexpected weight gain, fever, chills, .  Does have fatigue   GI: Has had some nausea and shooting abdominal pain with certain foods  GU: Denies, menstrual problems including irregular, heavy or painful menses, prior abnormal pap smear, dysuria, hematuria, urinary incontinence, urinary urgency, vaginal discharge, abnormal vaginal bleeding, pelvic pain     PHYSICAL EXAM:  BP 102/58  Pulse 84  Temp (Src) 98.8 F (37.1 C) (Oral)  Resp 20  Wt 152 lbs 2.0 oz (69.003 kg)  LMP OB (08/01/06)  General: healthy, alert, no distress  Skin: Skin color, texture, turgor normal. No rashes or lesions.  Head: Normocephalic. No masses, lesions, tenderness or abnormalities  Eyes: Lids/periorbital skin normal., Conjunctivae/corneas clear. PERRL, EOM's intact.  Ears: External ears normal. Canals clear. TM's normal.  Nose: normal  Oropharynx: normal  Dentition: normal dentition for age  Neck: Neck supple. No adenopathy. Thyroid symmetric, normal size, without nodules  Lungs: clear to auscultation  Heart: no murmurs, clicks, or gallops and regular rate and rhythm  Breasts: No obvious deformity or mass to inspection, nipples everted bilaterally, no skin lesion or nipple discharge, no mass palpated, no axillary lymphadenopathy, fibrocystic change without dominant masses  Abd: Abdomen soft, non-tender. BS normal. No masses or organomegaly.  FHT 168  Vulva: no visible lesions  Vagina: no abnormal discharge, normal color  Cervix:  nulliparous, PAP obtained (endo and ectocervical sampling), GC & chlamydia sample collected  Uterus-size: enlarged, c/w 10 week size  Adnexa: normal   Bony pelvis adequate? YES  Hemorrhoids? NO  Spine: Back symmetric, no deformity; ROM normal; No CVA tenderness.  Ext: Normal, without deformities, edema, or skin discoloration  Neuro: Cranial nerves 2-12 intact, strength intact all four extremities, sensation intact to soft touch all four extremities, gait normal    ASSESSMENT/PLAN:   Diagnosis   Dx Name Primary Encounter Dx   . SUPERVIS NORMAL 1ST PREG Yes     Discussed Integrated Screening: NO --   Discussed Cystic Fibrosis Screening: no  Discussed Quad Screen: not discussed  Starting Colace for constipation.  Obtaining u/s for confirmation of dates.  Prenatal labs obtained and packed given.  F/u in 4 weeks.   ----------------------------  [id number]

## 2006-01-06 ENCOUNTER — Encounter (INDEPENDENT_AMBULATORY_CARE_PROVIDER_SITE_OTHER): Payer: Self-pay | Admitting: Family Medicine

## 2006-01-06 LAB — GC&CHLAM NUCLEIC ACID DETECTN
Chlam Trachomatis Nucleic Acid: NEGATIVE
N.Gonorrhoeae(GC) Nucleic Acid: NEGATIVE

## 2006-01-06 LAB — HIV 1&2 AB SCREEN

## 2006-01-07 LAB — PRENATAL PANEL _ UWPN CLINICS
Hematocrit: 37 % (ref 36–45)
Hepatitis B Surface Antigen w/Reflex: NEGATIVE
Indir. Antiglobulin Rslt (Sendout): NEGATIVE
RPR: NONREACTIVE
Rubella Antibody Level: >20 IU
Rubella Immune Status Result: POSITIVE

## 2006-01-11 ENCOUNTER — Telehealth (INDEPENDENT_AMBULATORY_CARE_PROVIDER_SITE_OTHER): Payer: Self-pay | Admitting: Internal Medicine

## 2006-01-11 LAB — CERVICAL CANCER SCREENING: Cytologic Impression: NEGATIVE

## 2006-01-11 NOTE — Telephone Encounter (Signed)
Apap up to 4grams in 24 hr period.  Usually 500mg  q 6 hrs prn.  Otc cough preps may be used "sparingly".  Short term periods- 1-2 days okay.

## 2006-01-11 NOTE — Telephone Encounter (Signed)
Please advise on type of medication and how much.

## 2006-01-11 NOTE — Telephone Encounter (Signed)
TRIAGE/ADVICE  -----------  PCP: Marlon Pel, MD  Main reason for calling is: Pt sts she is pregnant and she  has a cold and also has a fever of 99.2.  She'd like to know what kind of medication she can take?  How long has this [phone] lasted: 3 days    Daytime call back number: [phone] After 5: [phone] Saturday: [phone]   Okay to leave detailed VM or msg with someone at any of these numbers: YES

## 2006-01-11 NOTE — Telephone Encounter (Signed)
Pt was informed and will make a f/u appt if she is not feeling any better in two days.  Closing TE

## 2006-02-02 ENCOUNTER — Encounter (INDEPENDENT_AMBULATORY_CARE_PROVIDER_SITE_OTHER): Payer: Self-pay | Admitting: Family Medicine

## 2006-02-02 ENCOUNTER — Ambulatory Visit (INDEPENDENT_AMBULATORY_CARE_PROVIDER_SITE_OTHER): Payer: PRIVATE HEALTH INSURANCE | Admitting: Family Medicine

## 2006-02-02 VITALS — BP 106/58 | HR 80 | Temp 98.6°F | Resp 16 | Wt 152.0 lb

## 2006-02-02 LAB — PR U/A OB (PROT/GLU DIPSTICK), ONSITE
Glucose: NEGATIVE mg/dL
Protein: NEGATIVE mg/dL

## 2006-02-02 NOTE — Progress Notes (Signed)
S:  Lisa Flynn is a 30 year old female G1P0 at  58 4/7 wks who presents for routine OB f/u.  Concerns today include: had spotting after intercourse, had u/s that was normal.  Has known cervical polyp from last exam.  Pt mild abdominal cramping with the spotting, resolved.  No further bleeding except after intercourse,  leak of fluid.      Patient Active Problem List   Diagnoses Code   . 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 SUPERVIS NORMAL 1ST PREG V22.0   . RH POSITIVE-SPECIAL OB CODE 1101       O:  BP 106/58  Pulse 80  Temp (Src) 98.6 F (37 C) (Oral)  Resp 16  Wt 152 lbs (68.947 kg)  LMP OB (07/30/06)  Last 3 Encounter Wt Readings:   Date Wt   02/02/2006 152 lbs (68.947 kg)   01/05/2006 152 lbs 2.0 oz (69.003 kg)   12/01/2005 143 lbs (64.864 kg)       Gen:  Well developed, appearing stated age and in no acute distress  see above    A/P:  1.  Diagnosis   Dx Name Primary Encounter Dx   . SUPERVIS NORMAL 1ST PREG Yes     Spotting only after intercourse without other warning signs with known endocervical polyp, most c/w with polyp.  Advised to f/u if heavy bleeding, bleeding outside of intercourse, pain.  Information on QUAD screen given.  Follow up in 3 weeks.  [id number]

## 2006-02-02 NOTE — Progress Notes (Signed)
SUBJECTIVE:  Lisa Flynn is a 30 year old female who presents for OB visit but with the following concerns:  URI symptoms for 5 days starting with congestion and coryza and progressing to congestion, coryza, wheezing, cough and headache.  Overall symptoms are stable.  Pt denies abdominal pain, nausea, vomiting, fever. Taking food and fluids well.  Tried Robitussin and Tylenol with some relief in symptoms.  No history of chronic sinusitis, allergies or asthma.    OBJECTIVE:  BP 106/58  Pulse 80  Temp (Src) 98.6 F (37 C) (Oral)  Resp 16  Wt 152 lbs (68.947 kg)  LMP OB (07/30/06)  General appearance: healthy, alert, no distress  Skin:  No rash  HEENT:  Eyes: normal, lids appear normal, no swelling or erythema, PERRLA and EOM's intact  Ears: R TM - normal, L TM - normal  Nose: mildly congested  No tenderness to percussion of frontal or maxillary sinuses  Oropharynx: normal  Neck: shotty anterior and posterior nodes bilaterally  Lungs: clear to auscultation  Heart: normal rate, regular rhythm,no murmurs, clicks, or gallops      ASSESSMENT:  . ACUTE NASOPHARYNGITIS   Discussed viral vs. bacterial etiologies.   Symptomatic treatment with fluids, rest, humidification, acetaminophen, decongestants.  Call for consideration of  antibiotics if symptoms persist > 5-7 days, worsen, or new symptoms develop.  If still not improved in 1 week, needs to return to clinic for evaluation.

## 2006-02-23 ENCOUNTER — Ambulatory Visit (INDEPENDENT_AMBULATORY_CARE_PROVIDER_SITE_OTHER): Payer: PRIVATE HEALTH INSURANCE | Admitting: Family Medicine

## 2006-02-23 VITALS — BP 108/54 | HR 76 | Temp 97.8°F | Resp 16 | Wt 156.0 lb

## 2006-02-23 LAB — PR U/A OB (PROT/GLU DIPSTICK), ONSITE
Glucose: NEGATIVE mg/dL
Protein: NEGATIVE mg/dL

## 2006-02-23 NOTE — Progress Notes (Signed)
S:  Lisa Flynn is a 30 year old female G1P0 at  38 4/7wks who presents for routine OB f/u.  Concerns today include: headaches are recuring daily, similar to prior to her pregnancy, using Tylenol  Pt denies abdominal cramping, bleeding, leak of fluid.  Pt reports no FM yet    Patient Active Problem List   Diagnoses Code   . 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 SUPERVIS NORMAL 1ST PREG V22.0   . RH POSITIVE-SPECIAL OB CODE 1101         O:  BP 108/54  Pulse 76  Temp (Src) 97.8 F (36.6 C) (Oral)  Resp 16  Wt 156 lbs (70.761 kg)  LMP OB (07/30/06)  Last 3 Encounter Wt Readings:   Date Wt   02/23/2006 156 lbs (70.761 kg)   02/02/2006 152 lbs (68.947 kg)   01/05/2006 152 lbs 2.0 oz (69.003 kg)         Gen:  Well developed, appearing stated age and in no acute distress  see above    A/P:  1.  Diagnosis   Dx Name Primary Encounter Dx   . SUPERVIS NORMAL 1ST PREG Yes     Doing well, good weight gain.  Will check QUAD screen today and ordered 20 week fetal survey.  Discussed limiting Tylenol to occasional use to prevent rebound HA, trying massage, stretching and heat instead.  F/u if HA change or worsen or any accompanying neurologic symptoms.  Follow up in 4 weeks.

## 2006-02-25 LAB — PRENATAL RISK, QUAD SCREEN
.: 0
Maternal Age At Term (Edd): 30.8 years
PR AFP Mom: 1.26
PR Beta Hcg Mom: 0.66
PR Estriol Mom: 0.84
PR Gestational Age: 18.6 weeks
PR Inhibin Mom: 1.15
PR Maternal Weight: 156 [lb_av]
PR Number Of Fetuses: 1
PR Panel Interpretation: NEGATIVE
PR Risk Of Down Based On Age: 1
PR Risk Of Ontd: 1

## 2006-03-14 ENCOUNTER — Encounter (INDEPENDENT_AMBULATORY_CARE_PROVIDER_SITE_OTHER): Payer: Self-pay | Admitting: Family Medicine

## 2006-03-30 ENCOUNTER — Encounter (INDEPENDENT_AMBULATORY_CARE_PROVIDER_SITE_OTHER): Payer: PRIVATE HEALTH INSURANCE | Admitting: Family Medicine

## 2006-04-04 ENCOUNTER — Ambulatory Visit (INDEPENDENT_AMBULATORY_CARE_PROVIDER_SITE_OTHER): Payer: PRIVATE HEALTH INSURANCE | Admitting: Family Medicine

## 2006-04-04 ENCOUNTER — Encounter (INDEPENDENT_AMBULATORY_CARE_PROVIDER_SITE_OTHER): Payer: Self-pay | Admitting: Family Medicine

## 2006-04-04 VITALS — BP 108/60 | HR 80 | Temp 97.8°F | Resp 12 | Wt 160.4 lb

## 2006-04-04 LAB — PR U/A OB (PROT/GLU DIPSTICK), ONSITE
Glucose: NEGATIVE mg/dL
Protein: NEGATIVE mg/dL

## 2006-04-04 NOTE — Progress Notes (Signed)
S:  Lisa Flynn is a 30 year old female G1P0 at 37 2/7 wks who presents for routine OB f/u.  Concerns today include: none.  Pt denies abdominal cramping, bleeding, leak of fluid.  Pt reports good FM.    Patient Active Problem List   Diagnoses Code   . 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 SUPERVIS NORMAL 1ST PREG V22.0   . RH POSITIVE-SPECIAL OB CODE 1101       O:  BP 108/60  Pulse 80  Temp (Src) 97.8 F (36.6 C) (Oral)  Resp 12  Wt 160 lbs 6.4 oz (72.757 kg)  LMP OB (07/30/06)  Last 3 Encounter Wt Readings:   Date Wt   04/04/2006 160 lbs 6.4 oz (72.757 kg)   02/23/2006 156 lbs (70.761 kg)   02/02/2006 152 lbs (68.947 kg)       Gen:  Well developed, appearing stated age and in no acute distress  see above    A/P:  1.  Diagnosis   Dx Name Primary Encounter Dx   . SUPERVIS NORMAL 1ST PREG Yes     Doing well.  Discussed smoking cessation.  Discussed prenatal classes, pt interested.  Follow up in 4 weeks.  [id number]

## 2006-05-04 ENCOUNTER — Ambulatory Visit (INDEPENDENT_AMBULATORY_CARE_PROVIDER_SITE_OTHER): Payer: PRIVATE HEALTH INSURANCE | Admitting: Family Medicine

## 2006-05-04 VITALS — BP 106/60 | HR 92 | Temp 96.8°F | Resp 16 | Wt 167.1 lb

## 2006-05-04 LAB — HEMATOCRIT: Hematocrit: 34 % — ABNORMAL LOW (ref 36–45)

## 2006-05-04 LAB — PR U/A OB (PROT/GLU DIPSTICK), ONSITE
Glucose: NEGATIVE mg/dL
Protein: NEGATIVE mg/dL

## 2006-05-04 NOTE — Progress Notes (Signed)
S:  Lisa Flynn is a 31 year old female G1P0 at  48 4/7 wks who presents for routine OB f/u.  Concerns today include:none  Pt denies abdominal cramping, bleeding, leak of fluid.  Pt reports good FM.    Patient Active Problem List   Diagnoses Code   . 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 SUPERVIS NORMAL 1ST PREG V22.0   . RH POSITIVE-SPECIAL OB CODE 1101       O:  BP 106/60  Pulse 92  Temp (Src) 96.8 F (36 C) (Oral)  Resp 16  Wt 167 lbs 2.0 oz (75.807 kg)  LMP OB (07/30/06)  Last 3 Encounter Wt Readings:   Date Wt   05/04/2006 167 lbs 2.0 oz (75.807 kg)   04/04/2006 160 lbs 6.4 oz (72.757 kg)   02/23/2006 156 lbs (70.761 kg)     Gen:  Well developed, appearing stated age and in no acute distress  see above    A/P:  1.  Diagnosis   Dx Name Primary Encounter Dx   . SUPERVIS NORMAL 1ST PREG Yes     Doing well, no concerns.  Glucola and Hct today.  Discussed prenatal classes.  Follow up in 4 weeks.  [id number]

## 2006-05-05 LAB — 1H GLUCOSE TOL TEST, NON-PREGNANT: Glucose, BLD: 131 mg/dL

## 2006-05-06 ENCOUNTER — Telehealth (INDEPENDENT_AMBULATORY_CARE_PROVIDER_SITE_OTHER): Payer: Self-pay | Admitting: Family Medicine

## 2006-05-06 NOTE — Telephone Encounter (Signed)
Please inform the patient that 'Your screening test for diabetes in pregnancy came up one point above normal.  I would recommend that you schedule the 4 hour testing to confirm whether or not you have diabetes of pregnancy. Please schedule this when you can arrive fasting (no food in 8-12 hours) and plan to be here for 4 hours. '. If patient has any additional questions regarding this message please make a follow-up appointment to discuss in more detail.  Thanks.

## 2006-05-06 NOTE — Telephone Encounter (Signed)
Left msg to rc.

## 2006-05-07 NOTE — Telephone Encounter (Signed)
Pt informed of Dr.Debelaks below message. Appt scheduld for fasting 4 hour glucose test on 05/13/06.

## 2006-05-13 ENCOUNTER — Other Ambulatory Visit (INDEPENDENT_AMBULATORY_CARE_PROVIDER_SITE_OTHER): Payer: PRIVATE HEALTH INSURANCE

## 2006-05-13 LAB — GLUCOSE TOLERANCE (3 HOUR): Glucose, BLD: 50 mg/dL

## 2006-05-13 LAB — GLUCOSE TOLERANCE (FASTING): Glucose, BLD: 71 mg/dL

## 2006-05-13 LAB — GLUCOSE TOLERANCE (2 HOUR): Glucose, BLD: 123 mg/dL

## 2006-05-13 LAB — GLUCOSE TOLERANCE (1 HOUR): Glucose, BLD: 144 mg/dL

## 2006-06-01 ENCOUNTER — Encounter (INDEPENDENT_AMBULATORY_CARE_PROVIDER_SITE_OTHER): Payer: Self-pay | Admitting: Family Medicine

## 2006-06-01 ENCOUNTER — Ambulatory Visit (INDEPENDENT_AMBULATORY_CARE_PROVIDER_SITE_OTHER): Payer: PRIVATE HEALTH INSURANCE | Admitting: Family Medicine

## 2006-06-01 VITALS — BP 108/60 | HR 100 | Temp 97.0°F | Resp 24 | Wt 173.5 lb

## 2006-06-01 LAB — PR U/A OB (PROT/GLU DIPSTICK), ONSITE: Glucose: NEGATIVE mg/dL

## 2006-06-01 NOTE — Progress Notes (Signed)
S:  Lisa Flynn is a 32 year old female G1P0 at  23 4/7 wks who presents for routine OB f/u.  Concerns today include: none  Pt denies abdominal cramping, bleeding, leak of fluid.  Pt reports good FM.    Patient Active Problem List   Diagnoses Code   . 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 SUPERVIS NORMAL 1ST PREG V22.0   . RH POSITIVE-SPECIAL OB CODE 1101   . TOBACCO USE DISORDER 305.1       O:  BP 108/60  Pulse 100  Temp (Src) 97 F (36.1 C) (Oral)  Resp 24  Wt 173 lbs 8.0 oz (78.699 kg)  LMP OB (07/30/06)  Last 3 Encounter Wt Readings:   Date Wt   06/01/2006 173 lbs 8.0 oz (78.699 kg)   05/04/2006 167 lbs 2.0 oz (75.807 kg)   04/04/2006 160 lbs 6.4 oz (72.757 kg)       Gen:  Well developed, appearing stated age and in no acute distress  see above    A/P:  1.  Diagnosis   Dx Name Primary Encounter Dx   . SUPERVIS NORMAL 1ST PREG Yes     Doing well.  Reviewed signs of PTL or PROM, watch fetal movement.  F/u in 3 weeks, sooner if concerns.  [id number]

## 2006-06-22 ENCOUNTER — Ambulatory Visit (INDEPENDENT_AMBULATORY_CARE_PROVIDER_SITE_OTHER): Payer: PRIVATE HEALTH INSURANCE | Admitting: Family Medicine

## 2006-06-22 ENCOUNTER — Encounter (INDEPENDENT_AMBULATORY_CARE_PROVIDER_SITE_OTHER): Payer: Self-pay | Admitting: Family Medicine

## 2006-06-22 VITALS — BP 102/58 | HR 108 | Temp 99.4°F | Resp 28 | Wt 181.0 lb

## 2006-06-22 LAB — PR U/A OB (PROT/GLU DIPSTICK), ONSITE
Glucose: NEGATIVE mg/dL
Protein: NEGATIVE mg/dL

## 2006-06-22 LAB — PR STREP TEST RAPID, ONSITE: Strep A Bacterial Ag: NEGATIVE

## 2006-06-22 NOTE — Progress Notes (Signed)
S:  Lisa Flynn is a 31 year old female G1P0 at  89 4/7 wks who presents for routine OB f/u.  Concerns today include: sore throat x 1 day, no fever.  Has an occasional "menstrual cramp" in lower abdomen.  Pt denies abdominal cramping, bleeding, leak of fluid.  Pt reports very good FM.  Smoking 4 cigarettes per day.    Patient Active Problem List   Diagnoses Code   . 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 SUPERVIS NORMAL 1ST PREG V22.0   . RH POSITIVE-SPECIAL OB CODE 1101   . TOBACCO USE DISORDER 305.1       O:  BP 102/58  Pulse 108  Temp (Src) 99.4 F (37.4 C) (Oral)  Resp 28  Wt 181 lbs (82.101 kg)  LMP OB (07/30/06)  Last 3 Encounter Wt Readings:   Date Wt   06/22/2006 181 lbs (82.101 kg)   06/01/2006 173 lbs 8.0 oz (78.699 kg)   05/04/2006 167 lbs 2.0 oz (75.807 kg)         Gen:  Well developed, appearing stated age and in no acute distress  see above    Prenatal on 06/22/2006   U/A OB (PROT/GLU DIPSTICK)   Component Value Range   . GLUCOSE NEG  NEG- (mg/dL)   . Protein NEG  NEG-TRACE- (mg/dL)   STREP TEST,RAPID   Component Value Range   . STREP A BACTERIAL AG NEG  -        A/P:  1.  Diagnoses   Dx Name Primary Encounter Dx   . SUPERVIS NORMAL 1ST PREG   . ACUTE PHARYNGITIS     Doing well other than continued tobacco use, no warning signs.  GBS at next visit.  Advised to watch URI symptoms and f/u if not improved in 1 week, sooner if worsening.  Follow up in 1 1/2 weeks.

## 2006-06-25 LAB — R/O BETA STREP CULTURE

## 2006-07-01 ENCOUNTER — Ambulatory Visit (INDEPENDENT_AMBULATORY_CARE_PROVIDER_SITE_OTHER): Payer: PRIVATE HEALTH INSURANCE | Admitting: Family Medicine

## 2006-07-01 VITALS — BP 110/66 | HR 100 | Temp 96.8°F | Resp 20 | Wt 179.2 lb

## 2006-07-01 LAB — PR U/A OB (PROT/GLU DIPSTICK), ONSITE
Glucose: NEGATIVE mg/dL
Protein: NEGATIVE mg/dL

## 2006-07-01 NOTE — Progress Notes (Signed)
S:  Lisa Flynn is a 31 year old female G1P0  at 47 6/7 wks who presents accompanied by her husband for routine OB f/u.  Concerns today include: none  Pt denies abdominal cramping, bleeding, leak of fluid, new HA, vision change, rapid swelling.  Pt reports good FM.    Patient Active Problem List   Diagnoses Code   . 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 SUPERVIS NORMAL 1ST PREG V22.0   . RH POSITIVE-SPECIAL OB CODE 1101   . TOBACCO USE DISORDER 305.1           O:  BP 110/66  Pulse 100  Temp (Src) 96.8 F (36 C) (Oral)  Resp 20  Wt 179 lbs 4.0 oz (81.307 kg)  LMP OB (07/30/06)  Last 3 Encounter Wt Readings:   Date Wt   07/01/2006 179 lbs 4.0 oz (81.307 kg)   06/22/2006 181 lbs (82.101 kg)   06/01/2006 173 lbs 8.0 oz (78.699 kg)         Gen:  Well developed, appearing stated age and in no acute distress  see above    A/P:  1.  Diagnosis   Dx Name Primary Encounter Dx   . SUPERVIS NORMAL 1ST PREG Yes     Doing well.  GBS obtained today.    Follow up in 1 1/2 weeks.  [id number]

## 2006-07-05 ENCOUNTER — Encounter (INDEPENDENT_AMBULATORY_CARE_PROVIDER_SITE_OTHER): Payer: Self-pay | Admitting: Family Medicine

## 2006-07-05 LAB — CULTURE:R/O GP.B STREP:GENITAL

## 2006-07-11 ENCOUNTER — Telehealth (INDEPENDENT_AMBULATORY_CARE_PROVIDER_SITE_OTHER): Payer: Self-pay | Admitting: Internal Medicine

## 2006-07-11 NOTE — Telephone Encounter (Signed)
Received phone call from pt. Stated that baby hasnt moved most of the day, only when she is "pushing" on her stomach, maybe 7 times in the last hour or so.  She is c/o of abdominal cramps, discribed like menstration cramps.  Pt is [redacted]weeks along.  Spoke to Dr. Kyra Searles, was advised to be seen at [facility].  Placed phone call and gave report to [place] at [facility].

## 2006-07-11 NOTE — Telephone Encounter (Signed)
TRIAGE/ADVICE  -----------  PCP: Marlon Pel, MD  Main reason for calling is: Pt states having painful cramps and baby has not moved a lot all today.  How long has this episode lasted: see above  Daytime call back number: [phone] After 5: same Saturday: same   Okay to leave detailed VM or msg with someone at any of these numbers: YES

## 2006-07-11 NOTE — Telephone Encounter (Signed)
Thanks.  Please call on 4/1 to see how things went

## 2006-07-15 ENCOUNTER — Ambulatory Visit (INDEPENDENT_AMBULATORY_CARE_PROVIDER_SITE_OTHER): Payer: PRIVATE HEALTH INSURANCE | Admitting: Family Medicine

## 2006-07-15 VITALS — BP 110/60 | HR 100 | Temp 97.8°F | Resp 16 | Wt 179.4 lb

## 2006-07-15 LAB — PR U/A OB (PROT/GLU DIPSTICK), ONSITE: Glucose: NEGATIVE mg/dL

## 2006-07-15 NOTE — Progress Notes (Signed)
S:  Lisa Flynn is a 31 year old female G1P0 at  84 6/7 wks who presents for routine OB f/u.  Concerns today include: Birth center f/u from Monday for decreased FM.  Had some irregular ctx that resolved then returned yesterday for about 1/2 day.  Has had good fetal movement since Monday.  Pt denies abdominal cramping, bleeding, leak of fluid.      Patient Active Problem List   Diagnoses Code   . 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 SUPERVIS NORMAL 1ST PREG V22.0   . RH POSITIVE-SPECIAL OB CODE 1101   . TOBACCO USE DISORDER 305.1       O:  BP 110/60  Pulse 100  Temp (Src) 97.8 F (36.6 C) (Oral)  Resp 16  Wt 179 lbs 6.0 oz (81.364 kg)  LMP OB (07/30/06)  Last 3 Encounter Wt Readings:   Date Wt   07/15/2006 179 lbs 6.0 oz (81.364 kg)   07/01/2006 179 lbs 4.0 oz (81.307 kg)   06/22/2006 181 lbs (82.101 kg)         Gen:  Well developed, appearing stated age and in no acute distress  see above  SVE FT/30%/-2 soft posterior    A/P:  1.  Diagnosis   Dx Name Primary Encounter Dx   . SUPERVIS NORMAL 1ST PREG Yes     Discussed routine labor management and signs of labor, warning signs.    Follow up in 1 week.  [address]

## 2006-07-22 ENCOUNTER — Ambulatory Visit (INDEPENDENT_AMBULATORY_CARE_PROVIDER_SITE_OTHER): Payer: PRIVATE HEALTH INSURANCE | Admitting: Family Medicine

## 2006-07-22 VITALS — BP 100/58 | HR 88 | Temp 96.8°F | Resp 16 | Wt 181.0 lb

## 2006-07-22 LAB — PR U/A OB (PROT/GLU DIPSTICK), ONSITE
Glucose: NEGATIVE mg/dL
Protein: NEGATIVE mg/dL

## 2006-07-22 NOTE — Progress Notes (Signed)
S:  Lisa Flynn is a 31 year old female G1P0 at 69 6/7 wks who presents for routine OB f/u.  Concerns today include: none.  Smokes about 4 cigarettes per day.  Some non-painful contractions throughout most days.  HA yesterday.  Pt denies abdominal cramping, bleeding, leak of fluid.  Pt reports good FM.    Patient Active Problem List   Diagnoses Code   . 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 SUPERVIS NORMAL 1ST PREG V22.0   . RH POSITIVE-SPECIAL OB CODE 1101   . TOBACCO USE DISORDER 305.1           O:  BP 100/58  Pulse 88  Temp (Src) 96.8 F (36 C) (Oral)  Resp 16  Wt 181 lbs (82.101 kg)  LMP OB (07/30/06)  Last 3 Encounter Wt Readings:   Date Wt   07/22/2006 181 lbs (82.101 kg)   07/15/2006 179 lbs 6.0 oz (81.364 kg)   07/01/2006 179 lbs 4.0 oz (81.307 kg)       Gen:  Well developed, appearing stated age and in no acute distress  see above    A/P:  1.  Diagnosis   Dx Name Primary Encounter Dx   . SUPERVIS NORMAL 1ST PREG Yes     Doing well, reviewed signs of labor, signs of PROM.    Follow up in 10 days

## 2006-07-28 ENCOUNTER — Telehealth (INDEPENDENT_AMBULATORY_CARE_PROVIDER_SITE_OTHER): Payer: Self-pay | Admitting: Internal Medicine

## 2006-07-28 NOTE — Telephone Encounter (Signed)
Received phone call from pt.  Stated she is [redacted]weeks pregnant.  Woke this morning with contractions, about apart, she stated some spotting noted in bathroom.  Now the contractions are apart with back pain, and more spotting, says the flow looks like she is having her period.  Pt stated the baby is still moving-unclear if baby is moving as much. Pt was advised to got to [employer] and Delivery immediately and will notify pcp. Placed phone call to [place]/Lori, and was informed that pt is coming.

## 2006-07-28 NOTE — Telephone Encounter (Signed)
Noted. Agree with advice given.

## 2006-07-29 NOTE — Telephone Encounter (Signed)
Please call to check on pt tomorrow. Thanks.

## 2006-07-29 NOTE — Telephone Encounter (Signed)
Please call to check on pt- did she deliver or what happened at L&D?  I will not be available to do a delivery or see newborn until Monday.

## 2006-07-29 NOTE — Telephone Encounter (Signed)
Spoke to pt, stated she was only dilated at 1cm. They made her walk for , but there was no change.  Sent pt home.  Pt is to return today if pain is unbearable, or water breaks. Pt states she will probably go back to L & D later today.

## 2006-07-29 NOTE — Telephone Encounter (Signed)
Ok will postpone TE to f/u with pt on Saturday.

## 2006-07-30 ENCOUNTER — Inpatient Hospital Stay (HOSPITAL_COMMUNITY): Payer: PRIVATE HEALTH INSURANCE | Admitting: Family Medicine

## 2006-07-30 NOTE — Telephone Encounter (Signed)
Routing TE as FYI to Dr. Debelak.

## 2006-07-30 NOTE — Telephone Encounter (Signed)
Patient admitted to L&D at 0200.  I will deliver, and have patient f/u with Dr. Kyra Searles.  MGT

## 2006-07-31 NOTE — Telephone Encounter (Signed)
Noted! Thank you

## 2006-08-01 ENCOUNTER — Telehealth (INDEPENDENT_AMBULATORY_CARE_PROVIDER_SITE_OTHER): Payer: Self-pay | Admitting: Family Medicine

## 2006-08-01 ENCOUNTER — Encounter (INDEPENDENT_AMBULATORY_CARE_PROVIDER_SITE_OTHER): Payer: PRIVATE HEALTH INSURANCE | Admitting: Family Medicine

## 2006-08-01 NOTE — Telephone Encounter (Signed)
Patient delivered 07/30/06 at about 7PM.  She ran temp of 40 degrees in late labour, so she was treated for chorioamniotis and baby was admitted to [facility].  Both did well post-partum, and will be discharged this afternoon, assuming no growth in baby's cultures.  MGT

## 2006-08-08 ENCOUNTER — Telehealth (INDEPENDENT_AMBULATORY_CARE_PROVIDER_SITE_OTHER): Payer: Self-pay | Admitting: Family Medicine

## 2006-08-08 MED ORDER — PERCOCET 5-325 MG OR TABS
ORAL_TABLET | ORAL | Status: AC
Start: 2006-08-08 — End: 2006-08-17

## 2006-08-08 NOTE — Telephone Encounter (Signed)
Pt still having abdominal pain s/p delivery.  Requesting more percocet.  Rx'd below, but advised we would not prescribe further.

## 2006-08-08 NOTE — Telephone Encounter (Signed)
Please inform the patient that 'I have not had a chance to fill out your forms yet.  We typically require at least 1 week to fill out forms or an office visit.  I will do them this morning and these should be available for pick up this afternoon (4/29) after 1PM'. If patient has any additional questions regarding this message please make a follow-up appointment to discuss in more detail.  Thanks.

## 2006-08-08 NOTE — Telephone Encounter (Signed)
See additional TE next day.

## 2006-08-09 NOTE — Telephone Encounter (Signed)
Pt was informed of msg below. Also advised pt that provider would be back in clinic on Wednesday 4/30-08- please let us know where the form was placed. Thanks!

## 2006-08-09 NOTE — Telephone Encounter (Signed)
Form completed and placed at front desk for pick up

## 2006-08-09 NOTE — Telephone Encounter (Signed)
Pt notified and had no further questions. Closing TE.

## 2006-10-05 ENCOUNTER — Ambulatory Visit (INDEPENDENT_AMBULATORY_CARE_PROVIDER_SITE_OTHER): Payer: PRIVATE HEALTH INSURANCE | Admitting: Internal Medicine

## 2006-10-05 ENCOUNTER — Encounter (INDEPENDENT_AMBULATORY_CARE_PROVIDER_SITE_OTHER): Payer: Self-pay | Admitting: Internal Medicine

## 2006-10-05 NOTE — Progress Notes (Signed)
Lisa Flynn is a 31 year old female who is 9 weeks postpartum.    Concerns today include the following:  See separate note.    Obstetric History     G0   P0   T0   P0    TAB0   SAB0   E0   M0   L0        Patient Active Problem List   Diagnoses Date Noted   . TOBACCO USE DISORDER [305.1] 05/04/2006     Priority: High   . SUPERVIS NORMAL 1ST PREG [V22.0] 01/06/2006     Priority: High     GBS negative.  blood type A+, HBsAg neg, Rubella I, HIV neg, Hct 37 at 12 wks, GC/CT neg, Quad neg, pap nl  1st trimester u/s 1 week older than expected, pt sure of LMP, keeping LMP dating  Girl by u/s   . RH POSITIVE-SPECIAL OB CODE [1101] 01/06/2006     Priority: High   . ALOPECIA AREATA [704.01] 06/07/2001     Priority: High         Intrapartum/ Postpartum course   (Note: If using OB Episode, see Outcome Information SmartForm)      Date of delivery: 07/30/2006      location: [facility]           Physician: Lysbeth Penner                                             Mode of delivery/ complications: vaginal delivery w/o complications      Procedures: none      Child: singleton female        Name: [name]      Birth wt: 8 pounds 3 ounces      Neonatal complications: maternal fever and neontal fever, treated with antibiotics      Feeding: both breast and bottle - Similac with iron      Difficulties with breastfeeding? YES - will not latch      Other concerns re. child? NO      Help in caring for baby? YES    Social      Relationship of father of baby to mother: spouse in home      Father of baby supportive?: YES      Number of other children in home: none      Mother (patient) working outside of home?: NO      Anticipated date of return to work: n/a        Physical/ Psych Problems      Fever?: NO      Abdominal pain?: NO      Breasts: YES: lump. Tried massaging it when makes it hurt.          Discharge?: NO      Perineal discomfort?: NO      Menses?: YES: had one cycle so far         Depression?: NO      Adjustment  problems?: NO    Contraception      Has resumed intercourse?:YES                Painful?: NO      Preferred contraceptive method: none      Currently using: none         OBJECTIVE:  BP 98/60  Pulse 72  Temp (  Src) 98.4 F (36.9 C) (Oral)  Resp 12  Wt 148 lbs 8.0 oz (67.359 kg)  LMP 09/25/2006   Gen: healthy, alert, no distress  HEENT: negative  Breasts: No obvious deformity or mass to inspection, nipples everted bilaterally, no skin lesion or nipple discharge, no axillary lymphadenopathy, positive findings: mass in right breast in upper outer quadrant, mobile about 2.5 cm in diameter, mildly tender  Lungs: clear to auscultation   Cor: normal rate, regular rhythm and no murmurs, clicks, or gallops  Abd: soft, non-tender. BS normal. No masses or organomegaly  PELVIC:   vulva: no visible lesions  perineum: Normal   rectum: deferred  vagina: no abnormal discharge  cervix: parous, no abnormal discharge or bleeding, normal appearance grossly  bimanual exam: no adnexal masses or tenderness, no cervical motion tenderness, no uterine tenderness, normal involution of uterus postpartum  ------------------------------  ON-SITE LABS:   none    ASSESSMENT/PLAN:  31 year old, 9 weeks postpartum, doing well - see separate note for discussion of other complaints and assessment    Menaal was counseled regarding return to normal activities,   including sexual activity.

## 2006-10-06 LAB — PR RADEX SPINE LUMBOSACRAL 2/3 VIEWS

## 2006-10-09 NOTE — Progress Notes (Signed)
CC: back pain at epidural site, lump in breast  HPI: Lisa Flynn is a 31 year old female who is 9 week postpartum complains of two complaints:    1. Pain in back at epidural site.  This bothers her at upper lumbar spine area. Worse with bending over, certain movements. No fevers, no aching. Not keeping her up at night. No numbness, tingling or weakness of extremities.     2. Lump in right breast. She is pumping for her child, not actually breast feeding (baby will not latch). Pumps about every 2 to 3 hours. Not particularly painful. No overlying skin changes.    No past medical history on file.  Patient Active Problem List   Diagnoses Code   . 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 SUPERVIS NORMAL 1ST PREG V22.0   . RH POSITIVE-SPECIAL OB CODE 1101   . TOBACCO USE DISORDER 305.1       Physical Exam  BP 98/60  Pulse 72  Temp (Src) 98.4 F (36.9 C) (Oral)  Resp 12  Wt 148 lbs 8.0 oz (67.359 kg)  LMP 09/25/2006  General: alert, NAD  Back: Mild point tenderness at around L3.  Has full range of motion  Neuro: strength 5/5 in lower extremities, DTRs 2+ gait normal  Breast exam: there is a well circumscribed about 2.5 cm mass in right breast in upper outer quadrant. Mildly tender to palpation. Mobile.    Xray spine: no abnormalities seen    A/P  1. Back pain: suspect bruise of bone from epidural.  No signs of infection, no neurological signs to suggest epidural hematoma or abscess.  Will continue to monitor but if not improving or developing new symptoms would consider MRI of spine  2. Breast mass: suspect blocked milk duct or cyst:  Advised to massage during pumping, monitor for now.  May resolve after menstrual cycle which would support benign cyst. If not resolving will obtain ultrasound.

## 2006-10-15 ENCOUNTER — Ambulatory Visit (INDEPENDENT_AMBULATORY_CARE_PROVIDER_SITE_OTHER): Payer: PRIVATE HEALTH INSURANCE | Admitting: Family Medicine

## 2006-10-15 VITALS — BP 100/60 | HR 82 | Temp 97.4°F | Resp 20 | Wt 154.0 lb

## 2006-10-15 LAB — PR U/A AUTO DIPSTICK ONLY, ONSITE
Bilirubin (Indirect): NEGATIVE
Glucose: NEGATIVE mg/dL
Ketones, URN: NEGATIVE mg/dL
Leukocytes: NEGATIVE
Nitrite, URN: NEGATIVE
Occult Blood, URN: NEGATIVE
Protein: NEGATIVE mg/dL
Specific Gravity, URN: 1.015 (ref 1.005–1.030)
Urobilinogen, URN: NORMAL E.U./dL
pH, Whole Blood: 6.5 (ref 5.0–8.0)

## 2006-10-15 MED ORDER — VALTREX 1 G OR TABS
ORAL_TABLET | ORAL | Status: DC
Start: 2006-10-15 — End: 2007-06-14

## 2006-10-15 MED ORDER — CIPROFLOXACIN HCL 500 MG OR TABS
ORAL_TABLET | ORAL | Status: DC
Start: 2006-10-15 — End: 2007-06-14

## 2006-10-15 NOTE — Progress Notes (Addendum)
Addended by: Luanna Cole on: 10/15/2006 1:58:15 PM     Modules accepted: Orders

## 2006-10-15 NOTE — Progress Notes (Signed)
Lisa Flynn is a 31 year old female with history of severe dysuria, "like boiling water", onset within the past 24 hours.  She had something like this back in 10/03. Urinary cultures and screening for sexually transmitted infection were subsequently negative.  She has no known history of HSV, but last time she had this she had onset of bilateral inquinal lymph node swelling that started shortly after urinary symptoms began.  She has not been tested for HSV before.  She is not having those symptoms at present.    OBJECTIVE:  GEN: well nourished, well developed, no distress  VS: All recorded vital signs are stable as noted    UA - Negative     ASSESSMENT & PLAN: dysuria of uncertain nature. Suspect acute urethral syndrome.  Labs and Rx as per orders pending results of todays labs.  The patient indicates understanding of these issues and agrees with the plan.

## 2006-10-16 LAB — GC&CHLAM NUCLEIC ACID DETECTN
Chlam Trachomatis Nucleic Acid: NEGATIVE
N.Gonorrhoeae(GC) Nucleic Acid: NEGATIVE

## 2006-10-17 LAB — URINE C/S
Colony Count: <100
Culture: NO GROWTH

## 2006-10-18 LAB — HERPES TYPE 1 & 2 SEROLOGY: HSV Western Blot Result: POSITIVE

## 2006-10-28 ENCOUNTER — Other Ambulatory Visit (INDEPENDENT_AMBULATORY_CARE_PROVIDER_SITE_OTHER): Payer: Self-pay | Admitting: Family Practice

## 2006-10-28 MED ORDER — NYSTATIN 100000 UNIT/GM EX CREA
TOPICAL_CREAM | CUTANEOUS | Status: DC
Start: 2006-10-28 — End: 2007-06-14

## 2007-06-05 ENCOUNTER — Telehealth (INDEPENDENT_AMBULATORY_CARE_PROVIDER_SITE_OTHER): Payer: Self-pay | Admitting: Internal Medicine

## 2007-06-05 NOTE — Telephone Encounter (Signed)
Pt calling for first OB appointment. She took home pregnancy test and feels she is due somewhere around Sept 27th. Pt is willing to deliver at the [facility] and wants Dr. Kyra Searles as her OB.  Scheduled her for next Wed

## 2007-06-05 NOTE — Telephone Encounter (Signed)
Requests appointment with Dr. Kyra Searles  PCP: Marlon Pel, MD  Requested Clinic:[place]  Reason for Visit: New OB appt  Preferred Date: Any day Mon-Thurs  Preferred Time :After 2 or 3pm  Caller's Name:Courney M Sestak   Daytime call back number: [phone] After 5: same Saturday: same   Okay to leave detailed VM or msg with someone at any of these numbers: YES

## 2007-06-10 NOTE — Telephone Encounter (Signed)
Sounds good. Thanks 

## 2007-06-14 ENCOUNTER — Encounter (INDEPENDENT_AMBULATORY_CARE_PROVIDER_SITE_OTHER): Payer: Self-pay | Admitting: Family Medicine

## 2007-06-14 ENCOUNTER — Ambulatory Visit (INDEPENDENT_AMBULATORY_CARE_PROVIDER_SITE_OTHER): Payer: PRIVATE HEALTH INSURANCE | Admitting: Family Medicine

## 2007-06-14 VITALS — BP 102/68 | HR 76 | Temp 98.8°F | Resp 16 | Wt 158.0 lb

## 2007-06-14 LAB — PR U/A OB (PROT/GLU DIPSTICK), ONSITE: Glucose: NEGATIVE mg/dL

## 2007-06-14 MED ORDER — PRENATAL 19 OR CHEW
CHEWABLE_TABLET | ORAL | Status: DC
Start: 2007-06-14 — End: 2009-02-27

## 2007-06-14 NOTE — Progress Notes (Signed)
INITIAL OB HISTORY/PE    Have you received prenatal care during this pregnancy at another clinic? NO    Planned pregnancy? YES  Attitude towards pregnancy: happy  relationship of FOB to [place]: spouse, living together  See OB Smart Forms and Specialty/OB Hx for additional information.    Pertinent ROS:   CONST: Denies, fatigue, unexpected weight loss, unexpected weight gain  GI: nausea  GU: Denies, prior abnormal pap smear, vaginal discharge, abnormal vaginal bleeding, pelvic pain     PHYSICAL EXAM:  General: healthy, alert, no distress  Skin: Skin color, texture, turgor normal. No rashes or lesions.  Head: Normocephalic. No masses, lesions, tenderness or abnormalities  Eyes: Lids/periorbital skin normal., Conjunctivae/corneas clear. PERRL, EOM's intact.  Ears: External ears normal. Canals clear. TM's normal.  Nose: normal  Oropharynx: normal  Dentition: normal dentition for age  Neck: Neck supple. No adenopathy. Thyroid symmetric, normal size, without nodules  Lungs: clear to auscultation  Heart: no murmurs, clicks, or gallops and regular rate and rhythm  Breasts: No obvious deformity or mass to inspection, nipples everted bilaterally, no skin lesion or nipple discharge, no mass palpated, no axillary lymphadenopathy, positive findings: approx 2 cm rubbery mass at 9 oclock right breast similar to density at upper outer quadrant  Abd: Abdomen soft, non-tender. BS normal. No masses or organomegaly  Vulva: no visible lesions, normal appearing BUS glands  Vagina: no abnormal discharge, normal color  Cervix: parous  Uterus-size: enlarged, c/w 10 week size  Adnexa: normal   Bony pelvis adequate? YES- proven to 8 lb  Hemorrhoids? NO  Spine: Back symmetric, no deformity; ROM normal  Ext: Normal, without deformities, edema, or skin discoloration    ASSESSMENT/PLAN:   V22.1 SUPERVIS OTHER NORMAL PREG  (primary encounter diagnosis)  Plan: U/A OB (PROT/GLU DIPSTICK), PRENATAL 19 OR         CHEW, URINE C&S, PRENATAL PANEL 1_  [facility]         CLINICS, PAP SMEAR (CYTOPATH, GYN)-[facility], HIV 1&2        AB SCREEN, VENIPUNCTURE FEE, SPECIMEN HANDLING         FEE DR->LAB, GC&CHLAM NUCLEIC ACID DETECTN,         SONO PREG FIRST TRIMESTER    611.72 LUMP OR MASS IN BREAST:  Suspect normal prominent breast tissue, larger due to pregnancy.  ordered US EXAM, BREAST(S).  F/u based on results.      Discussed Integrated Screening: NO --   Discussed Cystic Fibrosis Screening: N  Discussed Quad Screen:  NO   ----------------------------  E&M 57846

## 2007-06-15 ENCOUNTER — Encounter (INDEPENDENT_AMBULATORY_CARE_PROVIDER_SITE_OTHER): Payer: Self-pay | Admitting: Family Medicine

## 2007-06-15 LAB — GC&CHLAM NUCLEIC ACID DETECTN
Chlam Trachomatis Nucleic Acid: NEGATIVE
N.Gonorrhoeae(GC) Nucleic Acid: NEGATIVE

## 2007-06-15 LAB — HIV 1&2 AB SCREEN

## 2007-06-16 LAB — PRENATAL PANEL 1_ UWPN CLINICS
Hematocrit: 40 % (ref 36–45)
Hepatitis B Surface Antigen w/Reflex: NONREACTIVE
Indir. Antiglobulin Rslt (Sendout): NEGATIVE
RPR: NONREACTIVE
Rubella Antibody Level: >20 IU
Rubella Immune Status Result: POSITIVE

## 2007-06-16 LAB — URINE C/S: Colony Count: 1300

## 2007-06-19 ENCOUNTER — Other Ambulatory Visit (HOSPITAL_BASED_OUTPATIENT_CLINIC_OR_DEPARTMENT_OTHER): Payer: PRIVATE HEALTH INSURANCE

## 2007-06-19 ENCOUNTER — Encounter (HOSPITAL_BASED_OUTPATIENT_CLINIC_OR_DEPARTMENT_OTHER): Payer: PRIVATE HEALTH INSURANCE

## 2007-06-19 LAB — US EXAM, BREAST(S)

## 2007-06-20 ENCOUNTER — Encounter (HOSPITAL_BASED_OUTPATIENT_CLINIC_OR_DEPARTMENT_OTHER): Payer: Self-pay

## 2007-06-20 ENCOUNTER — Encounter (INDEPENDENT_AMBULATORY_CARE_PROVIDER_SITE_OTHER): Payer: Self-pay | Admitting: Family Medicine

## 2007-06-20 ENCOUNTER — Telehealth (INDEPENDENT_AMBULATORY_CARE_PROVIDER_SITE_OTHER): Payer: Self-pay | Admitting: Family Medicine

## 2007-06-20 NOTE — Telephone Encounter (Signed)
Joni Reining is calling from [facility].  Pt is currently having her 1st trimester [place] and Joni Reining wants to know if she can do a NT or integrated screen as pt is far enough along (11 weeks and 6 days by [place]).  Joni Reining states she is trying to save pt a second trip to Radiology as this is a common request for added pictures.      [place] Day, Dr Kyra Searles is not in clinic today, but I will ask the covering provider.  Joni Reining asks that we generate a new referral if we are agreeable.  I asked if I could give a verbal but she said no, they need a new referral to result.  Joni Reining is can be reached at [phone], pt is waiting.

## 2007-06-20 NOTE — Telephone Encounter (Signed)
Per Joni Reining needed to add NT protocol to orders. Reordered as below.

## 2007-06-20 NOTE — Telephone Encounter (Signed)
Noted. Closing TE.

## 2007-06-20 NOTE — Telephone Encounter (Signed)
Phone call to Radiology I let them know that we have generated a new referral. They will let Joni Reining know.  FYI to Dr Kyra Searles

## 2007-06-21 LAB — CERVICAL CANCER SCREENING: Cytologic Impression: NEGATIVE

## 2007-06-22 LAB — PR OB US NUCHAL MEAS, 1 GEST

## 2007-06-22 LAB — PR SONO PREG FIRST TRIMESTER

## 2007-07-12 ENCOUNTER — Encounter (INDEPENDENT_AMBULATORY_CARE_PROVIDER_SITE_OTHER): Payer: Self-pay | Admitting: Family Medicine

## 2007-07-12 ENCOUNTER — Ambulatory Visit (INDEPENDENT_AMBULATORY_CARE_PROVIDER_SITE_OTHER): Payer: PRIVATE HEALTH INSURANCE | Admitting: Family Medicine

## 2007-07-12 VITALS — BP 106/56 | HR 84 | Temp 96.8°F | Resp 24 | Wt 161.0 lb

## 2007-07-12 LAB — PR U/A OB (PROT/GLU DIPSTICK), ONSITE
Glucose: NEGATIVE mg/dL
Protein: NEGATIVE mg/dL

## 2007-07-12 MED ORDER — VICODIN 5-500 MG OR TABS
ORAL_TABLET | ORAL | Status: DC
Start: 2007-07-12 — End: 2007-07-21

## 2007-07-12 MED ORDER — PHENERGAN 25 MG OR TABS
ORAL_TABLET | ORAL | Status: AC
Start: 2007-07-12 — End: 2007-07-21

## 2007-07-12 NOTE — Progress Notes (Signed)
S:  Lisa Flynn is a 32 year old female G2P1 at  50 3/7wks who presents for routine OB f/u.  Concerns today include: severe headache over the last 24 hours, "dull, very painful", localized to right side, similar to locations as in the past but more severe.  No accompanying neurologic symptoms.  Caffiene and 500-750 mg doses of Tylenol have not helped.  Has been cutting back on tobacco use, down to 3 cigarettes per day.  Pt denies abdominal cramping, bleeding, leak of fluid.  Pt reports she is not sure if she had FM.    Encounter Diagnoses   Diagnoses Code   . 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 RH POSITIVE-SPECIAL OB CODE 1101   . TOBACCO USE DISORDER 305.1   . SUPERVIS OTHER NORMAL PREG V22.1         O:  BP 106/56  Pulse 84  Temp(Src) 96.8 F (36 C) (Oral)  Resp 24  Wt 161 lb (73.029 kg)  LMP OB (01/07/08)  Last 3 Encounter Wt Readings:   Date Wt   07/12/2007 161 lb (73.029 kg)   06/14/2007 158 lb (71.668 kg)   10/15/2006 154 lb (69.854 kg)       Gen:  Well developed, appearing stated age and appears uncomfortable, tearful.    see above    A/P:  1.    Encounter Diagnoses   Name Primary?   Marland Kitchen HEADACHE: worsening of typical HA for pt, possibly triggered by nicotine w/drawl.  Recommended trial of Vicodin.  If not improved, would try Phenergan, but discussed that it is class C in pregnancy despite studies that indicate it is low risk.  If still not improved within 24-48 hours, recommend a repeat OV.      . SUPERVIS OTHER NORMAL PREG:  No warning signs, no signs of preeclampsia.  Will f/u in 3 weeks so that she can get QUAD screen.        [address]

## 2007-08-02 ENCOUNTER — Encounter (INDEPENDENT_AMBULATORY_CARE_PROVIDER_SITE_OTHER): Payer: Self-pay | Admitting: Family Medicine

## 2007-08-02 ENCOUNTER — Ambulatory Visit (INDEPENDENT_AMBULATORY_CARE_PROVIDER_SITE_OTHER): Payer: PRIVATE HEALTH INSURANCE | Admitting: Family Medicine

## 2007-08-02 VITALS — BP 102/56 | HR 88 | Temp 98.8°F | Resp 16 | Wt 159.0 lb

## 2007-08-02 LAB — PR U/A OB (PROT/GLU DIPSTICK), ONSITE
Glucose: NEGATIVE mg/dL
Protein: NEGATIVE mg/dL

## 2007-08-02 NOTE — Progress Notes (Signed)
S:  Lisa Flynn is a 32 year old female G2P1 at  70 3/7 wks who presents for routine OB f/u.  Concerns today include: none. Headache has resolved.  Overall feeling well.  Some tailbone pain.  Pt denies abdominal cramping, bleeding, leak of fluid.      Patient Active Problem List   Diagnoses Code   . 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 RH POSITIVE-SPECIAL OB CODE 1101   . TOBACCO USE DISORDER 305.1   . SUPERVIS OTHER NORMAL PREG V22.1         O:  BP 102/56  Pulse 88  Temp(Src) 98.8 F (37.1 C) (Oral)  Resp 16  Wt 159 lb (72.122 kg)  LMP OB (01/07/08)  Last 3 Encounter Wt Readings:   Date Wt   08/02/2007 159 lb (72.122 kg)   07/12/2007 161 lb (73.029 kg)   06/14/2007 158 lb (71.668 kg)       Gen:  Well developed, appearing stated age and in no acute distress  see above    A/P:  1.  Patient Active Problem List   Name Primary?   . SUPERVIS OTHER NORMAL PREG Yes     Doing well.  Need to watch weight.  Counseled on QUAD screen, obtaining today.  Ordered 20 wk u/s.  Follow up in 4 weeks.  16109

## 2007-08-03 LAB — PRENATAL RISK, QUAD SCREEN
.: 0
Maternal Age At Term (Edd): 32.2 years
PR AFP Mom: 1.18
PR Beta Hcg Mom: 0.79
PR Estriol Mom: 0.72
PR Gestational Age: 18 weeks
PR Inhibin Mom: 0.73
PR Lmp Date: 12212008
PR Maternal Weight: 159 [lb_av]
PR Number Of Fetuses: 1
PR Panel Interpretation: NEGATIVE
PR Risk Of Down Based On Age: 1
PR Risk Of Down Syndrome: 1
PR Risk Of Ontd: 1
PR Ultrasound Crl: 54 mm
PR Ultrasound Date: 3102009

## 2007-08-25 ENCOUNTER — Encounter (INDEPENDENT_AMBULATORY_CARE_PROVIDER_SITE_OTHER): Payer: Self-pay | Admitting: Family Medicine

## 2007-08-25 ENCOUNTER — Encounter (HOSPITAL_BASED_OUTPATIENT_CLINIC_OR_DEPARTMENT_OTHER): Payer: PRIVATE HEALTH INSURANCE

## 2007-08-25 LAB — PR SONO PREG 2ND/3RD TRIMESTER

## 2007-08-30 ENCOUNTER — Ambulatory Visit (INDEPENDENT_AMBULATORY_CARE_PROVIDER_SITE_OTHER): Payer: PRIVATE HEALTH INSURANCE | Admitting: Family Medicine

## 2007-08-30 ENCOUNTER — Encounter (INDEPENDENT_AMBULATORY_CARE_PROVIDER_SITE_OTHER): Payer: Self-pay | Admitting: Family Medicine

## 2007-08-30 VITALS — BP 100/60 | HR 76 | Temp 97.8°F | Resp 24 | Wt 166.0 lb

## 2007-08-30 LAB — PR U/A OB (PROT/GLU DIPSTICK), ONSITE: Glucose: NEGATIVE mg/dL

## 2007-08-30 NOTE — Progress Notes (Signed)
S:  Lisa Flynn is a 32 year old female G2P1 at  64 3/7 wks who presents for routine OB f/u.  Concerns today include: none.  Return of occasional HA last week.  Still smoking several cigarettes per day.  Pt denies abdominal cramping, bleeding, leak of fluid.  Pt reports good FM.    Patient Active Problem List   Diagnoses Code   . 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 RH POSITIVE-SPECIAL OB CODE 1101   . TOBACCO USE DISORDER 305.1   . SUPERVIS OTHER NORMAL PREG-BOY V22.1       O:  BP 100/60  Pulse 76  Temp(Src) 97.8 F (36.6 C) (Oral)  Resp 24  Wt 166 lb (75.297 kg)  LMP OB (01/07/08)  Last 3 Encounter Wt Readings:   Date Wt   08/30/2007 166 lb (75.297 kg)   08/02/2007 159 lb (72.122 kg)   07/12/2007 161 lb (73.029 kg)       Gen:  Well developed, appearing stated age and in no acute distress  see above    A/P:  1.  Patient Active Problem List   Name Primary?   . SUPERVIS OTHER NORMAL PREG Yes     Doing well.  Reviewed u/s report.  Follow up in 4 weeks for Glucola.    [id number]

## 2007-09-01 ENCOUNTER — Telehealth (INDEPENDENT_AMBULATORY_CARE_PROVIDER_SITE_OTHER): Payer: Self-pay | Admitting: Family Medicine

## 2007-09-01 MED ORDER — VICODIN 5-500 MG OR TABS
ORAL_TABLET | ORAL | Status: DC
Start: 2007-09-01 — End: 2007-11-01

## 2007-09-01 NOTE — Telephone Encounter (Signed)
Refilled

## 2007-09-01 NOTE — Telephone Encounter (Signed)
Fax from pharmacy for Vicodin. This is historical medication for pt. Pt last seen on 08/30/07. Would you like to fill for pt or have ov fu?

## 2007-09-20 ENCOUNTER — Encounter (INDEPENDENT_AMBULATORY_CARE_PROVIDER_SITE_OTHER): Payer: Self-pay | Admitting: Family Medicine

## 2007-09-20 NOTE — Telephone Encounter (Signed)
Copied below message into pt's chart.  Closing.

## 2007-09-20 NOTE — Telephone Encounter (Signed)
From: Harold Barban   To: Daryel November Debelak   Sent: Wed Sep 20, 2007 11:34 AM   Subject: Lisa Flynn is sick    Hi Danielle, my daughter Lisa Flynn have a fever of 101.4. It started on Monday with fever 99.8, yesterday was 100.3 and today its 101.4. I give her Infants Tylenol but it does not bring the temperature dowm. She is also having a little diarea, not much but every time she passes gas a little comes out. She eats ok, drinks a lot of water and during day she acts normal and she cries a lot during the evening and night. What else do i need to do? At what point do I need to bring her in? I have appt with the nurse on Friday for her shot, do I need it to convert it to a Doc's appt? I really don't want her go get the shot untill her high temperature goes down.    Hattye

## 2007-09-27 ENCOUNTER — Encounter (INDEPENDENT_AMBULATORY_CARE_PROVIDER_SITE_OTHER): Payer: Self-pay | Admitting: Family Medicine

## 2007-09-27 ENCOUNTER — Ambulatory Visit (INDEPENDENT_AMBULATORY_CARE_PROVIDER_SITE_OTHER): Payer: PRIVATE HEALTH INSURANCE | Admitting: Family Medicine

## 2007-09-27 VITALS — BP 110/60 | HR 80 | Temp 97.8°F | Resp 20 | Wt 167.2 lb

## 2007-09-27 LAB — PR U/A OB (PROT/GLU DIPSTICK), ONSITE: Glucose: NEGATIVE mg/dL

## 2007-09-27 LAB — HEMATOCRIT: Hematocrit: 35 % — ABNORMAL LOW (ref 36–45)

## 2007-09-27 NOTE — Progress Notes (Signed)
S:  Lisa Flynn is a 32 year old female G2P1 at 20 3/7 wks who presents for routine OB f/u.  Concerns today include: none.  Has been having stable headaches, worse than with last pregnancy, no other neurologic symptoms.    Pt denies abdominal cramping, bleeding, leak of fluid.  Pt reports very good FM.    Patient Active Problem List   Diagnoses Code   . 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 RH POSITIVE-SPECIAL OB CODE 1101   . TOBACCO USE DISORDER 305.1   . SUPERVIS OTHER NORMAL PREG-BOY V22.1       O:  BP 110/60  Pulse 80  Temp(Src) 97.8 F (36.6 C) (Oral)  Resp 20  Wt 167 lb 4 oz (75.864 kg)  LMP OB (01/07/08)  Last 3 Encounter Wt Readings:   Date Wt   09/27/2007 167 lb 4 oz (75.864 kg)   08/30/2007 166 lb (75.297 kg)   08/02/2007 159 lb (72.122 kg)       Gen:  Well developed, appearing stated age and in no acute distress  see above    A/P:  1.  Patient Active Problem List   Name Primary?   . SUPERVIS OTHER NORMAL PREG-BOY Yes     Doing well.   Stable headaches and low back pain. Good FM.  Glucola and Hct obtained today.    Follow up in 4 weeks.  16109

## 2007-09-28 LAB — 1H GLUCOSE TOL TEST, NON-PREGNANT: Glucose, BLD: 132 mg/dL

## 2007-09-29 ENCOUNTER — Encounter (INDEPENDENT_AMBULATORY_CARE_PROVIDER_SITE_OTHER): Payer: Self-pay | Admitting: Family Medicine

## 2007-10-06 ENCOUNTER — Other Ambulatory Visit (INDEPENDENT_AMBULATORY_CARE_PROVIDER_SITE_OTHER): Payer: PRIVATE HEALTH INSURANCE

## 2007-10-07 LAB — GLUCOSE TOLERANCE (1 HOUR): Glucose, BLD: 179 mg/dL

## 2007-10-07 LAB — GLUCOSE TOLERANCE (2 HOUR): Glucose, BLD: 148 mg/dL

## 2007-10-07 LAB — GLUCOSE TOLERANCE (FASTING): Glucose, BLD: 72 mg/dL

## 2007-10-07 LAB — GLUCOSE TOLERANCE (3 HOUR): Glucose, BLD: 73 mg/dL

## 2007-10-08 ENCOUNTER — Encounter (INDEPENDENT_AMBULATORY_CARE_PROVIDER_SITE_OTHER): Payer: Self-pay | Admitting: Family Medicine

## 2007-10-25 ENCOUNTER — Ambulatory Visit (INDEPENDENT_AMBULATORY_CARE_PROVIDER_SITE_OTHER): Payer: PRIVATE HEALTH INSURANCE | Admitting: Family Medicine

## 2007-10-25 ENCOUNTER — Encounter (INDEPENDENT_AMBULATORY_CARE_PROVIDER_SITE_OTHER): Payer: Self-pay | Admitting: Family Medicine

## 2007-10-25 VITALS — BP 104/58 | HR 80 | Temp 97.4°F | Resp 16 | Wt 171.0 lb

## 2007-10-25 LAB — PR U/A OB (PROT/GLU DIPSTICK), ONSITE
Glucose: NEGATIVE mg/dL
Protein: NEGATIVE mg/dL

## 2007-10-25 NOTE — Progress Notes (Signed)
S:  Lisa Flynn is a 32 year old female G2P1 at  59 3/7 wks who presents for routine OB f/u.  Concerns today include: none  Pt denies abdominal cramping, bleeding, leak of fluid.  Pt reports good FM.    Patient Active Problem List   Diagnoses Code   . 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 RH POSITIVE-SPECIAL OB CODE 1101   . TOBACCO USE DISORDER 305.1   . SUPERVIS OTHER NORMAL PREG-BOY V22.1       O:  BP 104/58  Pulse 80  Temp(Src) 97.4 F (36.3 C) (Oral)  Resp 16  Wt 171 lb (77.565 kg)  LMP OB (01/07/08)  Last 3 Encounter Wt Readings:   Date Wt   10/25/2007 171 lb (77.565 kg)   09/27/2007 167 lb 4 oz (75.864 kg)   08/30/2007 166 lb (75.297 kg)       Gen:  Well developed, appearing stated age and in no acute distress  see above    A/P:  1.  Patient Active Problem List   Name Primary?   . SUPERVIS OTHER NORMAL PREG-BOY Yes     Doing well. Continued to advise to quit smoking.  Reviewed growth and healthy diet and exercise.  Follow up in 3 weeks.   16109

## 2007-11-01 ENCOUNTER — Other Ambulatory Visit (INDEPENDENT_AMBULATORY_CARE_PROVIDER_SITE_OTHER): Payer: Self-pay | Admitting: Family Medicine

## 2007-11-01 MED ORDER — VICODIN 5-500 MG OR TABS
ORAL_TABLET | ORAL | Status: DC
Start: 2007-11-01 — End: 2007-11-30

## 2007-11-01 NOTE — Telephone Encounter (Signed)
Re ordered   Please call in

## 2007-11-01 NOTE — Telephone Encounter (Signed)
See ecare message: Refill Vicodin  Medication Refill Documentation    Protocol used: Adult:  CONTROLLED SUBSTANCES - Do not refill:  Send to provider as Rx Auth.    Date last filled: 09/01/07     Date last seen for this issue:  07/12/07 DX Headache    Was appt recommended:  NO    Follow up appointment scheduled if needed?   NO    Labs/levels done as indicated per protocol:  NO    Rx transmittal preference if approved: Fax to pharmacy     Was Rx approved (to last until next appointment): NO    Pt notification: pending provider response    See pt ecare message. Sending to provider to review.

## 2007-11-01 NOTE — Telephone Encounter (Signed)
Called in, replied to pt.

## 2007-11-15 ENCOUNTER — Encounter (INDEPENDENT_AMBULATORY_CARE_PROVIDER_SITE_OTHER): Payer: Self-pay | Admitting: Family Medicine

## 2007-11-15 ENCOUNTER — Ambulatory Visit (INDEPENDENT_AMBULATORY_CARE_PROVIDER_SITE_OTHER): Payer: PRIVATE HEALTH INSURANCE | Admitting: Family Medicine

## 2007-11-15 VITALS — BP 112/64 | HR 92 | Temp 98.2°F | Resp 22 | Wt 173.4 lb

## 2007-11-15 LAB — PR U/A OB (PROT/GLU DIPSTICK), ONSITE: Glucose: NEGATIVE mg/dL

## 2007-11-15 NOTE — Progress Notes (Signed)
S:  Lisa Flynn is a 32 year old female G2P1 at  44 2/7 wks who presents for routine OB f/u.  Concerns today include:   1.  Requests STD screening.  Had a small sore inside vagina about 2 weeks ago that has subsequently resolved.  She is monogomous and feels her husband is as well, but has an ex-husband who was unfaithful and makes her worried.  Pt has no abdominal cramping, bleeding, leak of fluid.  Pt reports good FM.    Patient Active Problem List   Diagnoses Code   . 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 RH POSITIVE-SPECIAL OB CODE 1101   . TOBACCO USE DISORDER 305.1   . SUPERVIS OTHER NORMAL PREG-BOY V22.1       O:  BP 112/64  Pulse 92  Temp(Src) 98.2 F (36.8 C) (Oral)  Resp 22  Wt 173 lb 6.4 oz (78.654 kg)  LMP OB (01/07/08)  Last 3 Encounter Wt Readings:   Date Wt   11/15/2007 173 lb 6.4 oz (78.654 kg)   10/25/2007 171 lb (77.565 kg)   09/27/2007 167 lb 4 oz (75.864 kg)       Gen:  Well developed, appearing stated age and in no acute distress  see above    A/P:  1.  Patient Active Problem List   Name Primary?   . SUPERVIS OTHER NORMAL PREG-BOY Yes     Doing well.  Will check u/s for growth due to tobacco use.  Checking HSV although it does not sound like HSV, advised that it can take 6 weeks to get antibodies positive on testing.  Follow up in 2 weeks.  [id number]

## 2007-11-16 NOTE — Progress Notes (Signed)
Addended by: Eustace Pen on: 11/16/2007 12:08 PM     Modules accepted: Orders

## 2007-11-17 LAB — HERPES TYPE 1 & 2 SEROLOGY: HSV Western Blot Result: POSITIVE

## 2007-11-19 ENCOUNTER — Encounter (INDEPENDENT_AMBULATORY_CARE_PROVIDER_SITE_OTHER): Payer: Self-pay | Admitting: Family Medicine

## 2007-11-20 ENCOUNTER — Encounter (HOSPITAL_BASED_OUTPATIENT_CLINIC_OR_DEPARTMENT_OTHER): Payer: PRIVATE HEALTH INSURANCE

## 2007-11-20 ENCOUNTER — Encounter (INDEPENDENT_AMBULATORY_CARE_PROVIDER_SITE_OTHER): Payer: Self-pay | Admitting: Family Medicine

## 2007-11-21 LAB — PR SONO PREG 2ND/3RD TRIMESTER

## 2007-11-30 ENCOUNTER — Other Ambulatory Visit (INDEPENDENT_AMBULATORY_CARE_PROVIDER_SITE_OTHER): Payer: Self-pay | Admitting: Family Medicine

## 2007-11-30 MED ORDER — VICODIN 5-500 MG OR TABS
ORAL_TABLET | ORAL | Status: DC
Start: 2007-11-30 — End: 2007-12-20

## 2007-11-30 NOTE — Telephone Encounter (Signed)
Rx refilled. ClosingTE

## 2007-11-30 NOTE — Telephone Encounter (Signed)
I will refill for small supply but should f/u with Dr. Kyra Searles for any further refills.

## 2007-11-30 NOTE — Telephone Encounter (Signed)
Medication Refill Documentation Vicodin    Protocol used: Adult:  CONTROLLED SUBSTANCES - Do not refill:  Send to provider as Rx Auth.    Date last filled: 11/01/07 # 28    Date last seen for this issue:  09/27/07    Was appt recommended:  YES    Follow up appointment scheduled if needed?   YES    Labs/levels done as indicated per protocol:  N/A    Rx transmittal preference if approved: Electronically submit to pharmacy      Was Rx approved (to last until next appointment): NO    Pt notification: pharmacy request, patient not notified and pending provider response    Would you like to refill or have pt follow up?

## 2007-12-04 ENCOUNTER — Ambulatory Visit (INDEPENDENT_AMBULATORY_CARE_PROVIDER_SITE_OTHER): Payer: PRIVATE HEALTH INSURANCE | Admitting: Family Medicine

## 2007-12-04 ENCOUNTER — Encounter (INDEPENDENT_AMBULATORY_CARE_PROVIDER_SITE_OTHER): Payer: Self-pay | Admitting: Family Medicine

## 2007-12-04 VITALS — BP 104/58 | HR 80 | Temp 97.8°F | Resp 16 | Wt 171.0 lb

## 2007-12-04 LAB — PR U/A OB (PROT/GLU DIPSTICK), ONSITE
Glucose: NEGATIVE mg/dL
Protein: NEGATIVE mg/dL

## 2007-12-04 NOTE — Progress Notes (Signed)
S:  Lisa Flynn is a 32 year old female G2P1 at  51 1/7 wks who presents for routine OB f/u.  Concerns today include: increased nausea over the last few weeks.  GERD has worsened, Rolaids helps somewhat, has not tried any other medications. Headaches continue behind right eye, no changes, worse with her nausea.   Pt denies abdominal cramping, bleeding, leak of fluid.  Pt reports good FM.    Patient Active Problem List   Diagnoses Code   . 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 RH POSITIVE-SPECIAL OB CODE 1101   . TOBACCO USE DISORDER 305.1   . SUPERVIS OTHER NORMAL PREG-BOY V22.1       O:  BP 104/58  Pulse 80  Temp(Src) 97.8 F (36.6 C) (Oral)  Resp 16  Wt 171 lb (77.565 kg)  LMP OB (01/07/08)  Last 3 Encounter Wt Readings:   Date Wt   12/04/2007 171 lb (77.565 kg)   11/15/2007 173 lb 6.4 oz (78.654 kg)   10/25/2007 171 lb (77.565 kg)       Gen:  Well developed, appearing stated age and in no acute distress  see above    A/P:  1.  Patient Active Problem List   Name Primary?   . SUPERVIS OTHER NORMAL PREG-BOY Yes     Doing well.   Recommended a trial of ranitidine for her nausea.  F/u in 1 1/2 weeks, GBS at that time.  Discussed appropriate diet and weight gain, f/u at next visit.   Follow up in 1-2 weeks.  [id number]

## 2007-12-13 ENCOUNTER — Ambulatory Visit (INDEPENDENT_AMBULATORY_CARE_PROVIDER_SITE_OTHER): Payer: PRIVATE HEALTH INSURANCE | Admitting: Family Medicine

## 2007-12-13 ENCOUNTER — Encounter (INDEPENDENT_AMBULATORY_CARE_PROVIDER_SITE_OTHER): Payer: Self-pay | Admitting: Family Medicine

## 2007-12-13 VITALS — BP 116/60 | HR 88 | Temp 98.8°F | Resp 24 | Wt 172.0 lb

## 2007-12-13 LAB — PR U/A OB (PROT/GLU DIPSTICK), ONSITE: Glucose: NEGATIVE mg/dL

## 2007-12-13 NOTE — Progress Notes (Signed)
S:  Lisa Flynn is a 32 year old female G2P1 at  45 3/7 wks who presents for routine OB f/u.  Concerns today include:  None.  Some irregular but occasionally painful contractions, up to 1 hour at a time.     Pt denies abdominal cramping, bleeding, leak of fluid.  Pt reports very good FM.    Patient Active Problem List   Diagnoses Code   . 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 RH POSITIVE-SPECIAL OB CODE 1101   . TOBACCO USE DISORDER 305.1   . SUPERVIS OTHER NORMAL PREG-BOY V22.1   . HEADACHE 784.0         O:  BP 116/60  Pulse 88  Temp(Src) 98.8 F (37.1 C) (Oral)  Resp 24  Wt 172 lb (78.019 kg)  LMP OB (01/07/08)  Last 3 Encounter Wt Readings:   Date Wt   12/13/2007 172 lb (78.019 kg)   12/04/2007 171 lb (77.565 kg)   11/15/2007 173 lb 6.4 oz (78.654 kg)       Gen:  Well developed, appearing stated age and in no acute distress  see above    A/P:  1.  Patient Active Problem List   Name Primary?   . SUPERVIS OTHER NORMAL PREG-BOY Yes     GBS obtained today.  Pregnancy progressing well.  Reviewed signs of labor and gave labor numbers.     Follow up in 1 week.  [id number]

## 2007-12-14 ENCOUNTER — Telehealth (INDEPENDENT_AMBULATORY_CARE_PROVIDER_SITE_OTHER): Payer: Self-pay | Admitting: Family Medicine

## 2007-12-14 NOTE — Telephone Encounter (Signed)
It is in my office.  Please find out if she needs this before next Wednesday.

## 2007-12-14 NOTE — Telephone Encounter (Signed)
I called the patient, relayed message below, patient understands. Closing TE

## 2007-12-14 NOTE — Telephone Encounter (Signed)
GENERAL MESSAGE  Forward call to: Michelene Gardener, MD  Main reason for the call: Pt states she handed Michelene Gardener, MD FMLA paperwork and forgot to ask if it was finished at yesterday's appointment.  Call back expected: YES  Okay to leave a VM or msg with anyone at that number? YES

## 2007-12-14 NOTE — Telephone Encounter (Signed)
Dr Kyra Searles:  Do you have this paperwork ?

## 2007-12-14 NOTE — Telephone Encounter (Signed)
Patient states she would like this as soon as possible and definitely before next Wednesday.  Give to another provider?

## 2007-12-14 NOTE — Telephone Encounter (Signed)
I will be in an fill it out tomorrow morning.  Please let her know that it will be available for pick up tomorrow afternoon.

## 2007-12-15 ENCOUNTER — Telehealth (INDEPENDENT_AMBULATORY_CARE_PROVIDER_SITE_OTHER): Payer: Self-pay | Admitting: Family Medicine

## 2007-12-15 LAB — CULTURE:R/O GP.B STREP:GENITAL

## 2007-12-15 NOTE — Telephone Encounter (Addendum)
Addendum: Pt stopped by the clinic to pick up form. It wasn't at the front desk, checked Pod 2, Dr. Benny Lennert box, and her office. No documentation that it was in fact finished or put at any particular place. Advsd that I can forward along the message and will have the clinic give pt a call back regarding this matter. Does anyone know where this form is or if it's been completed?

## 2007-12-19 NOTE — Telephone Encounter (Addendum)
Apparently the form was put in the outgoing mail and it came back to [place] today. Called pt and she will pick up tomorrow. Closing TE.

## 2007-12-20 ENCOUNTER — Ambulatory Visit (INDEPENDENT_AMBULATORY_CARE_PROVIDER_SITE_OTHER): Payer: PRIVATE HEALTH INSURANCE | Admitting: Family Medicine

## 2007-12-20 ENCOUNTER — Encounter (INDEPENDENT_AMBULATORY_CARE_PROVIDER_SITE_OTHER): Payer: Self-pay | Admitting: Family Medicine

## 2007-12-20 VITALS — BP 104/50 | HR 80 | Temp 98.8°F | Resp 16 | Ht 65.0 in | Wt 174.4 lb

## 2007-12-20 LAB — PR U/A OB (PROT/GLU DIPSTICK), ONSITE: Glucose: NEGATIVE mg/dL

## 2007-12-20 MED ORDER — VICODIN 5-500 MG OR TABS
ORAL_TABLET | ORAL | Status: DC
Start: 2007-12-20 — End: 2008-02-05

## 2007-12-20 NOTE — Progress Notes (Signed)
S:  Lisa Flynn is a 32 year old female G2P1 at  38 3/7 wks who presents for routine OB f/u.  Concerns today include: headache persists, having one currently.  No new symptoms.  Some low back cramping.  No regular contractions.  Pt denies abdominal cramping, bleeding, leak of fluid.  Pt reports good FM.    Patient Active Problem List   Diagnoses Code   . 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 RH POSITIVE-SPECIAL OB CODE 1101   . TOBACCO USE DISORDER 305.1   . SUPERVIS OTHER NORMAL PREG-BOY V22.1   . HEADACHE 784.0         O:  LMP OB (01/07/08)  Last 3 Encounter Wt Readings:   Date Wt   12/13/2007 172 lb (78.019 kg)   12/04/2007 171 lb (77.565 kg)   11/15/2007 173 lb 6.4 oz (78.654 kg)       Gen: well developed, appears uncomfortable    see above    A/P:  1.  Patient Active Problem List   Name Primary?   Marland Kitchen HEADACHE    . SUPERVIS OTHER NORMAL PREG-BOY Yes     Recommended trying a small amount of caffiene at the onset of her headaches, refilled Vicodin as below.  F/u at next visit, sooner if new or worsening symptoms.   Follow up in 1 week.  [id number]

## 2007-12-27 ENCOUNTER — Ambulatory Visit (INDEPENDENT_AMBULATORY_CARE_PROVIDER_SITE_OTHER): Payer: PRIVATE HEALTH INSURANCE | Admitting: Family Medicine

## 2007-12-27 ENCOUNTER — Encounter (INDEPENDENT_AMBULATORY_CARE_PROVIDER_SITE_OTHER): Payer: Self-pay | Admitting: Family Medicine

## 2007-12-27 VITALS — BP 104/60 | HR 88 | Temp 99.2°F | Resp 24 | Wt 174.0 lb

## 2007-12-27 LAB — PR U/A OB (PROT/GLU DIPSTICK), ONSITE: Glucose: NEGATIVE mg/dL

## 2007-12-27 NOTE — Progress Notes (Signed)
S:  Lisa Flynn is a 32 year old female G2P1 at  66 3/7 wks who presents for routine OB f/u.  Concerns today include: none.  Some painful contractions off and on over the last 3 days.  Headache is stable.  Some swelling in legs primarily.  Pt denies abdominal cramping, bleeding, leak of fluid.  Pt reports good FM.    Patient Active Problem List   Diagnoses Code   . 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 RH POSITIVE-SPECIAL OB CODE 1101   . TOBACCO USE DISORDER 305.1   . SUPERVIS OTHER NORMAL PREG-BOY V22.1   . HEADACHE 784.0       O:  BP 104/60  Pulse 88  Temp(Src) 99.2 F (37.3 C) (Oral)  Resp 24  Wt 174 lb (78.926 kg)  LMP OB (01/07/08)  Last 3 Encounter Wt Readings:   Date Wt   12/27/2007 174 lb (78.926 kg)   12/20/2007 174 lb 6.4 oz (79.107 kg)   12/13/2007 172 lb (78.019 kg)       Gen:  Well developed, appearing stated age and in no acute distress  see above    A/P:  1.  Patient Active Problem List   Name Primary?   . SUPERVIS OTHER NORMAL PREG-BOY Yes     Doing well.  Discussed signs of labor and what to do if thinking she is labor, pt is to call here of L&D.    Follow up in 1 weeks.  [id number]

## 2007-12-28 ENCOUNTER — Ambulatory Visit (INDEPENDENT_AMBULATORY_CARE_PROVIDER_SITE_OTHER): Payer: PRIVATE HEALTH INSURANCE | Admitting: Family Medicine

## 2008-01-03 ENCOUNTER — Encounter (HOSPITAL_COMMUNITY): Payer: PRIVATE HEALTH INSURANCE

## 2008-01-03 ENCOUNTER — Encounter (INDEPENDENT_AMBULATORY_CARE_PROVIDER_SITE_OTHER): Payer: PRIVATE HEALTH INSURANCE | Admitting: Family Medicine

## 2008-01-03 ENCOUNTER — Encounter (HOSPITAL_BASED_OUTPATIENT_CLINIC_OR_DEPARTMENT_OTHER): Payer: PRIVATE HEALTH INSURANCE

## 2008-01-03 ENCOUNTER — Telehealth (INDEPENDENT_AMBULATORY_CARE_PROVIDER_SITE_OTHER): Payer: Self-pay | Admitting: Family Medicine

## 2008-01-03 NOTE — Telephone Encounter (Signed)
NEWBORN NOTIFICATION    Baby's PCP: Michelene Gardener, MD   Hospital Name & ph#:[facility]  Room #:8  Mother's Name: KATRINA DADDONA   Date of Birth: [birth year]/23  Time of Birth:10:53 am  Birth Weight:hasn't been weighed yet  Concerns about the baby?: NO  Is a call back to the parents or hospital needed? NO

## 2008-01-03 NOTE — Telephone Encounter (Signed)
FYI to Dr. Debelak

## 2008-01-03 NOTE — Telephone Encounter (Signed)
Noted. Thanks.

## 2008-01-15 ENCOUNTER — Encounter (INDEPENDENT_AMBULATORY_CARE_PROVIDER_SITE_OTHER): Payer: Self-pay | Admitting: Family Medicine

## 2008-01-15 ENCOUNTER — Ambulatory Visit (INDEPENDENT_AMBULATORY_CARE_PROVIDER_SITE_OTHER): Payer: PRIVATE HEALTH INSURANCE | Admitting: Family Medicine

## 2008-01-15 LAB — PR U/A AUTO W/MICRO, ONSITE
Bilirubin (Indirect): NEGATIVE
Casts, URN: NEGATIVE /LPF
Crystals, URN: NEGATIVE /LPF
Glucose: NEGATIVE mg/dL
Nitrite, URN: NEGATIVE
Protein: NEGATIVE mg/dL
RBC, URN: NEGATIVE /HPF (ref ?–2)
Specific Gravity, URN: 1.01 (ref 1.005–1.030)
Urobilinogen, URN: NORMAL E.U./dL
WBC, URN: NEGATIVE /HPF (ref ?–5)
pH, Whole Blood: 6 (ref 5.0–8.0)

## 2008-01-15 MED ORDER — KEFLEX 500 MG OR CAPS
ORAL_CAPSULE | ORAL | Status: AC
Start: 2008-01-15 — End: 2008-01-25

## 2008-01-15 NOTE — Progress Notes (Signed)
SUBJECTIVE: Lisa Flynn is a 32 year old female 12 days post partum who complains of dysuria urinary frequency x 3 days, without flank pain, fever, chills.  Having larger clots over the last few days, but now at light period level.       OBJECTIVE:   BP 92/54  Pulse 70  Temp(Src) 98.4 F (36.9 C) (Oral)  Resp 12  Wt 153 lb (69.4 kg)  LMP Nursing  Appears well, in no apparent distress.    The abdomen is soft without tenderness, guarding, mass, rebound or organomegaly.    Office Visit on 01/15/2008   U/A AUTO W/MICRO   Component Value Range   . Color, URN YELLOW  -    . Clarity, URN CLEAR  -    . GLUCOSE NEG  NEG- (mg/dL)   . BILIRUBIN (INDIRECT) NEG  NEG-    . ketone TRACE (*) NEG- (mg/dL)   . SPECIFIC GRAVITY 1.010  1.005-1.030    . OCCULT BLOOD, URINE TRACE (*) NEG-    . PH 6.0  5.0-8.0    . Protein NEG  NEG-TRACE- (mg/dL)   . urobil NORMAL  NORMAL- (E.U./dL)   . nitrite NEG  NEG-    . leukocytes 1+ (*) NEG-    . RBC, URINE NEG  NEG(0-2)- (/HPF)   . WBC, URINE NEG  NEG(0-5)- (/HPF)   . EPITHELIAL CELLS, URINE 1+  - (/LPF)   . BACTERIA FEW  - (/HPF)   . CASTS, URINE NEG  - (/LPF)   . CRYSTALS, URINE NEG  - (/LPF)   . OTHER    -          ASSESSMENT: UTI uncomplicated without evidence of pyelonephritis    PLAN: sending for culture for confirmation.  Treatment per orders - also push fluids, may use Pyridium OTC prn. Call or return to clinic prn if these symptoms worsen or fail to improve as anticipated.    Flu shot given today.

## 2008-01-16 NOTE — Progress Notes (Signed)
Addended by: Dicie Beam C on: 01/16/2008  9:40 AM     Modules accepted: Orders

## 2008-01-16 NOTE — Progress Notes (Addendum)
Addended by: LEE, SIU C on: 01/16/2008      Modules accepted: Level of Service

## 2008-01-17 ENCOUNTER — Encounter (INDEPENDENT_AMBULATORY_CARE_PROVIDER_SITE_OTHER): Payer: Self-pay | Admitting: Family Medicine

## 2008-01-17 LAB — URINE C/S: Colony Count: 1500

## 2008-02-05 ENCOUNTER — Ambulatory Visit (INDEPENDENT_AMBULATORY_CARE_PROVIDER_SITE_OTHER): Payer: PRIVATE HEALTH INSURANCE | Admitting: Family Medicine

## 2008-02-05 ENCOUNTER — Encounter (INDEPENDENT_AMBULATORY_CARE_PROVIDER_SITE_OTHER): Payer: Self-pay | Admitting: Family Medicine

## 2008-02-05 VITALS — BP 110/70 | HR 80 | Temp 97.0°F | Resp 12 | Wt 145.0 lb

## 2008-02-05 MED ORDER — NYSTATIN 100000 UNIT/GM EX OINT
TOPICAL_OINTMENT | CUTANEOUS | Status: DC
Start: 2008-02-05 — End: 2008-08-16

## 2008-02-05 MED ORDER — VICODIN 5-500 MG OR TABS
ORAL_TABLET | ORAL | Status: DC
Start: 2008-02-05 — End: 2008-02-16

## 2008-02-05 NOTE — Progress Notes (Signed)
SUBJECTIVE:   Lisa Flynn is a 32 year old female who complains of low back pain for 2 week.  Describes as dull pain in lower back, positional with bending or lifting, without radiation down the legs. Precipitating factors: lifting her young children.   Prior history of back problems: recurrent self limited episodes of low back pain in the past, previously treated with rest. There is no numbness in the legs.  Tried Tylenol and 600 mg ibuprofen, helps only temporarily.  IceHot, massage, helps temporarily.    Also concerned that she might have a yeast infection of her nipples. Has noticed painful breastfeeding, cracking and moisture of nipples about 1 week ago.  Has stopped breastfeeding and is only pumping, wearing nipple shields to help air out the area and sterilizing her pump supplies but symptoms are not improving.  Her son may be developing white plaques in his mouth as well but is feeding well.     OBJECTIVE:  BP 110/70  Pulse 80  Temp(Src) 97 F (36.1 C) (Oral)  Resp 12  Wt 145 lb (65.772 kg)  LMP Nursing   Patient appears to be in mild to moderate pain, antalgic gait noted. Lumbosacral spine area reveals no local tenderness or mass.  Painful and reduced LS ROM noted. Straight leg raise is negative at 90 degrees on both sides. DTR's, motor strength and sensation normal  Right nipple moist and erythematous with superficial skin cracking, no discharge.      ASSESSMENT:   Lumbar strain  Cutaneous candidiasis      PLAN:  For acute pain, rest, intermittent application of heat (do not sleep on heating pad), analgesics and muscle relaxants are recommended. Discussed longer term treatment plan of prn NSAID's and back care exercise program, given in writing. Proper lifting with avoidance of heavy lifting discussed. Consider Physical Therapy and XRay studies if not improving. Call or return to clinic prn if these symptoms worsen or fail to improve as anticipated.  Rx'd Nystatin ointment as below and  recommended treating her son for thrush, keeping nipples dry (air dry if possible) sterilizing bottles and pump supplies.  F/u if not improving within 1 week, sooner if worsening.

## 2008-02-16 ENCOUNTER — Other Ambulatory Visit (INDEPENDENT_AMBULATORY_CARE_PROVIDER_SITE_OTHER): Payer: Self-pay | Admitting: Family Medicine

## 2008-02-16 NOTE — Telephone Encounter (Signed)
From: Lisa Flynn   To: Lisa Flynn   Sent: Fri Feb 16, 2008 3:56 PM   Subject: Medication Renewal Request    Original authorizing provider: Michelene Gardener, MD    Lisa Flynn would like a refill of the following medications:  VICODIN 5-500 MG OR TABS Michelene Gardener, MD]    Preferred pharmacy: [facility]    Comment:      Medication renewals requested in this message routed to other providers:

## 2008-02-17 MED ORDER — VICODIN 5-500 MG OR TABS
ORAL_TABLET | ORAL | Status: DC
Start: 2008-02-16 — End: 2008-02-19

## 2008-02-17 NOTE — Telephone Encounter (Signed)
See documentation in [facility]Care.

## 2008-02-17 NOTE — Telephone Encounter (Signed)
Phone call from patient I let her know message from [place] , call to [facility] I gave them verbal on medication closing Te

## 2008-02-17 NOTE — Telephone Encounter (Signed)
Left pt a message to call the clinic.

## 2008-02-17 NOTE — Telephone Encounter (Signed)
See refill request.

## 2008-02-19 ENCOUNTER — Encounter (INDEPENDENT_AMBULATORY_CARE_PROVIDER_SITE_OTHER): Payer: Self-pay | Admitting: Family Medicine

## 2008-02-19 ENCOUNTER — Ambulatory Visit (INDEPENDENT_AMBULATORY_CARE_PROVIDER_SITE_OTHER): Payer: PRIVATE HEALTH INSURANCE | Admitting: Family Medicine

## 2008-02-19 MED ORDER — VICODIN 5-500 MG OR TABS
ORAL_TABLET | ORAL | Status: DC
Start: 2008-02-19 — End: 2008-02-27

## 2008-02-19 NOTE — Progress Notes (Signed)
Lisa Flynn is a 32 year old female who is 6 weeks postpartum.    Concerns today include the following:   1. Continued low back pain despite 800 ibuprofen and Vicodin every 4 hours, overall feels symptoms are worsening and impacting her function.  No new symptoms. No radicular symptoms.     Obstetric History     G1   P1   T1   P0    TAB0   SAB0   E0   M0   L1          Patient Active Problem List   Diagnoses Date Noted   HEADACHE [784.0] 12/04/2007   Priority: High      TOBACCO USE DISORDER [305.1] 05/04/2006   Priority: High      ALOPECIA AREATA [704.01] 06/07/2001   Priority: High            Intrapartum/ Postpartum course   (Note: If using OB Episode, see Outcome Information SmartForm)      Date of delivery: 01/03/08    location: [facility]           Physician: [facility] resident                                        Mode of delivery/ complications: NSVD, no complications      Procedures: none      Child: singleton female        Name: Lisa Flynn      Birth wt: 7 pounds 1 ounces      Neonatal complications: none      Feeding: breast- pumps breastmilk and feeds with bottle      Difficulties with breastfeeding? NO      Other concerns re. child? NO      Help in caring for baby? YES    Social      Relationship of father of baby to mother: spouse in home      Father of baby supportive?: YES      Number of other children in home: 1 ages: [place]      Mother (patient) working outside of home?: NO      Anticipated date of return to work: n/a        Physical/ Psych Problems      Fever?: NO      Abdominal pain?: NO      Breasts: YES: nipple pain, improving slightly with Nystatin and hydrocortisone         Discharge?: NO      Perineal discomfort?: NO      Menses?: NO      Depression?: NO      Adjustment problems?: NO    Contraception      Has resumed intercourse?:YES                Painful?: YES initially      Preferred contraceptive method: IUD      Currently using: condoms         OBJECTIVE:  BP 118/64  Pulse 64  Temp(Src)  97.8 F (36.6 C) (Oral)  Resp 16  Wt 151 lb (68.493 kg)  LMP Nursing  Gen: healthy, alert, no distress, alert and oriented x 3  HEENT: negative  Breasts: No obvious deformity or mass to inspection, nipples everted bilaterally, no nipple cracking, some scant white discharge at nipple, lactating, stable cystic mass at 9 oclock right  breast, no axillary lymphadenopathy  Lungs: clear to auscultation   Cor: normal rate, regular rhythm and no murmurs, clicks, or gallops  Abd: soft, non-tender. BS normal. No masses or organomegaly  PELVIC:   vulva: no visible lesions, normal appearing BUS glands  perineum: normal   rectum: deferred  vagina: no abnormal discharge, normal color  cervix: PAP obtained (endo and ectocervical sampling)  bimanual exam: normal involution of uterus postpartum  ------------------------------  ON-SITE LABS:   none    ASSESSMENT/PLAN:  V24.2 ROUT POSTPART FOLLOW-UP  (primary encounter diagnosis):  Doing well.  [place] was counseled regarding return to normal activities, including sexual activity.   Handout on Paraguard given, pt will schedule in the near future.  Plan: SPECIMEN HANDLING FEE DR->LAB, PAP SMEAR         ([place], GYN)-[facility]    724.2 LOW BACK PAIN(LUMBAGO):  Worsening despite home PT and NSAIDs, recommended a trial of PHYSICAL THERAPY, refilled VICODIN 5-500 MG OR TABS and f/u if not improved within 2-3 weeks, sooner if worsening or new symptoms.

## 2008-02-20 LAB — GC&CHLAM NUCLEIC ACID DETECTN
Chlam Trachomatis Nucleic Acid: NEGATIVE
N.Gonorrhoeae(GC) Nucleic Acid: NEGATIVE

## 2008-02-23 ENCOUNTER — Encounter (INDEPENDENT_AMBULATORY_CARE_PROVIDER_SITE_OTHER): Payer: Self-pay

## 2008-02-23 ENCOUNTER — Ambulatory Visit (INDEPENDENT_AMBULATORY_CARE_PROVIDER_SITE_OTHER): Payer: PRIVATE HEALTH INSURANCE

## 2008-02-23 LAB — PR URINE PREGNANCY TEST HCG, ONSITE: Prelim. Pregnancy (HCG), SRM: NEGATIVE

## 2008-02-23 NOTE — Progress Notes (Signed)
PROCEDURE NOTE  INTRAUTERINE DEVICE INSERTION  PARAGARD T380A  Lot number: [id number]  Exp 1/15    Indication:  Desire for contraception  Allergic to iodine: NO    Review of contraindications:    Multiple sexual partners: NO    History of pelvic inflammatory disease: NO    History of ectopic pregnancy: NO    Known allergy to copper: NO    History of Wilson's disease: NO    Known immunodeficiency state: NO    Known uterine malformations: NO    Known uterine or cervical malignancy: NO    Unresolved abnormal uterine bleeding: NO    Nulliparity: NO (Not universally considered a contraindication to IUD use).    A separate visit for counseling and discussion of this   procedure did take place.  Cervical culture for gonorrhea and chlamydia  was done 02/19/08 and result was negative. A urine pregnancy test was done today and result was negative.  The date on which the patient's last normal menstrual period began was prior to delivery 7 weeks ago, Nursing.Marland Kitchen  Antibiotic prophylaxis against endocarditis was not indicated.     In compliance with federal regulations, the patient was given the   informational brochure that accompanies the IUD.  The risks and   benefits of use of the IUD were discussed with the patient and   written informed consent was obtained.  Risks include:     1. The remote chance of contraceptive failure   2. Increased risk of ectopic pregnancy if pregnancy occurs   3. Risk of PID if exposed to sexually transmitted diseases   4. Risk of heavier periods and dysmenorrhea   5. Risk of embedment of the IUD into the endometrium and consequent   difficulty in removal   6. Risk of uterine perforation during placement       The patient was placed in the dorsal lithotomy position.  Bimanual   exam showed the uterus to be in the neutral   position.  A Graves' speculum inserted into the vagina.  The cervix   was prepped with povidone iodine and a sharp tenaculum applied to   the anterior lip of the cervix.  A sterile  uterine sound   was used to sound the uterus to a depth of 6cm.  A   PARAGARD T380A intrauterine device was inserted using the   prepackaged inserter tube and rod to the depth to which the uterus   was sounded.  The IUD threads were trimmed to a length of 3cm from the cervical os.     The patient tolerated the procedure well and without evident   complications. I performed the entire procedure: YES    Postprocedure education was performed, including   the following points:    1. It is necessary to check manually for presence of the IUD   strings with each menstrual period.  2. Late menses may indicate pregnancy; prompt followup should occur   for any late menses.  3. Seek immediate care for signs, including pelvic pain, vaginal   discharge or excessive or intermenstrual bleeding, and fever.   4. Heavy cramping may indicate expulsion and should also prompt a   visit.  5. The IUD should be removed no more than 10 years after insertion.  6. Despite the lack of need to return for contraceptive needs, it   is still necessary to return for annual check-ups, pap smears,    etc.    Patient instructed to return  for follow-up after next menses (4-6 weeks)  AVS printed and given to patient: YES

## 2008-02-23 NOTE — Patient Instructions (Signed)
Intrauterine Device (IUD)     What is an intrauterine device (IUD)?     The intrauterine device (IUD) is a birth control device placed into a woman's uterus by a medical professional. It is usually made of plastic or metal with a string attached. Some IUDs contain copper or the female hormone progesterone. The IUD prevents pregnancy by changing the physical environment of the reproductive tract. These changes appear to prevent the egg from being fertilized.    There are two brands of IUD available in the [place]:  ParaGard, which uses a copper wire wrapped around the plastic device – can remain in place up to 10 years.   Mirena, which contains the hormone progesterone – can remain in place up to 5 years.    IUDs are effective immediately after insertion. The IUD is a method of birth control only and does not offer any protection from STDs. IUDs are recommended for women who are in a long-term mutually monogamous relationship.    Your health care provider will ask if you have had any of the following problems:   an infection in any of your reproductive organs (ovaries, uterus, fallopian tubes)   a pregnancy in your fallopian tube (ectopic pregnancy).     How is it used?     Your health care provider will insert the IUD into the uterus through the cervix (opening of the uterus). The IUD is usually inserted at the end of a menstrual period, when the cervix is slightly open and you are least likely to be pregnant. You may feel some cramping pain when the IUD is being inserted. You may be given a local anesthetic or pain medicine to help control discomfort during insertion.     Your health care provider may examine you after your next menstrual period to be sure the IUD is staying in the right place. During the first few months after insertion of an IUD, check often for the attached string to be sure that the IUD is still in the uterus. You should also check for the string after every menstrual period. You can do  this by putting a finger inside the vagina and feeling for the string near the cervix. As long as you can feel the string, the IUD is in position and it is unlikely that you will become pregnant. If you feel the hard plastic of the IUD, it is no longer in the correct place and you will have to see your health care provider to change it.     The IUD could come out accidentally in the first few months, possibly without being noticed. Always check the IUD before you have intercourse, or consider using a backup method of birth control during the first few months, just to be safe.     You should not use an IUD if:   You have cancer in the uterus or cervix.   You have vaginal bleeding of an unknown cause.   You may be pregnant.   You have pelvic inflammatory disease.   You should not use a copper IUD if you are allergic to copper or metals.       What are the potential benefits?     The benefits of an IUD are:   It is 99% effective as a method of preventing pregnancy.   Lovemaking does not need to be interrupted by the insertion of a birth control device or spermicide.   Replacement is required only every 1 to   10 years, depending on the type.   May offer some protection from endometrial cancer (Mirena)    What are the potential risks?     A number of problems could occur while you are using an IUD, some of which can be severe. These problems are listed below (the first two are the most common):   increased menstrual bleeding and cramps, mostly during the first few months of use (Paragard)  lighter menses, or occasionally amenorrhea (absence of menstruation) (Mirena)  anemia may develop (Paragard)  spotting between menstrual periods   irritation of your partner's penis (rare)  unnoticed accidental expulsion of the IUD, which may result in unexpected pregnancy   embedding of the IUD in the uterine wall or perforation of the uterus at insertion, with possible damage to other organs as well as internal bleeding   potential  problems if pregnancy occurs with an IUD in place, including an increased risk of ectopic (tubal) pregnancy, risk of miscarriage, plus risks of infection in the uterus and preterm birth of the baby.     There has been no evidence of birth defects resulting from the use of an IUD.     Alternative contraceptive options:     Alternatives to using an IUD are:   Abstinence  Hormonal methods of birth control including combined oral contraceptives, Nuvaring, Depo Provera, progestin-only pills and the birth control patch  Barrier methods of birth control, including the diaphragm and condoms/spermicides  Permanent sterilization – tubal ligation, vasectomy  Natural family planning     Instructions regarding the intrauterine device (IUD):     Although it is very rare, the IUD has in some instances been known to be "expelled" from the uterus (that is, fall out). For this reason, it is necessary to check for presence of the IUD strings after each menstrual period by inserting your fingers into the vagina and feeling that the strings are still present. If you cannot feel them, please call our office. In addition, severe cramping pains in the pelvic area can also be a sign of expulsion of the IUD. If you experience severe pelvic pain, please call our office.    The IUD is a very effective form of birth control but no form of birth control is perfect. If a menstrual period does not arrive on time, it may indicate that you are pregnant. If this occurs, please call our office.    Pelvic infections may progress more rapidly in women who have an IUD in place. Please call us promptly for any of the following symptoms of infection: pain in the pelvis or lower abdomen, vaginal discharge, very heavy periods, bleeding in between periods, or fever.     The PARAGARD T380A IUD is designed to last 10 years and then should be removed.    Please be sure to see your medical provider at least annually for check-ups and pap smears    When should I  call my health care provider?     Call if you:   Cannot find the IUD string.   Have vaginal discharge with a bad odor.   Have severe, unexpected pain in your lower abdomen, especially if it happens when you have intercourse.   Have a fever with no apparent cause.   Think you might be pregnant with the IUD still inside the uterus.   Want to have the IUD removed.      KEEP THESE INSTRUCTIONS FOR FUTURE REFERENCE    PLEASE FEEL FREE TO   CALL YOUR MEDICAL PROVIDER IF YOU HAVE QUESTIONS   OR CONCERNS

## 2008-02-26 ENCOUNTER — Other Ambulatory Visit (INDEPENDENT_AMBULATORY_CARE_PROVIDER_SITE_OTHER): Payer: Self-pay | Admitting: Family Medicine

## 2008-02-27 MED ORDER — VICODIN 5-500 MG OR TABS
ORAL_TABLET | ORAL | Status: DC
Start: 2008-02-27 — End: 2008-03-06

## 2008-02-27 NOTE — Telephone Encounter (Signed)
Medication Refill Documentation    Name of Medication: Vicodin    Prescribing provider:  Maryclare Labrador, MD    Protocol used (by medication type, not necessarily patient diagnosis): Adult:  CONTROLLED SUBSTANCES - Do not refill:  Send to provider as Rx Auth.    Date last filled: 02/19/08    Date last seen for this issue:  02/19/08    Date last seen in clinic: 02/23/08    Was appt recommended:  YES    If not otherwise specified, must be seen once a year.    Next scheduled appointment date:  03/25/2008    If not scheduled, please contact patient to schedule, or note effort to do so:      Labs/levels done as indicated per protocol: N/A    Rx transmittal preference if approved: Electronically submit to pharmacy      Was Rx approved (to last until next appointment): NO    Refill until next visit due.  If mail order, give a minimum of 90 days.    Pt notification: pending provider response    Additional information for or from provider?  No:

## 2008-03-05 ENCOUNTER — Other Ambulatory Visit (INDEPENDENT_AMBULATORY_CARE_PROVIDER_SITE_OTHER): Payer: Self-pay | Admitting: Family Medicine

## 2008-03-06 ENCOUNTER — Encounter (INDEPENDENT_AMBULATORY_CARE_PROVIDER_SITE_OTHER): Payer: Self-pay | Admitting: Family Medicine

## 2008-03-06 LAB — CERVICAL CANCER SCREENING: Cytologic Impression: NEGATIVE

## 2008-03-06 MED ORDER — VICODIN 5-500 MG OR TABS
ORAL_TABLET | ORAL | Status: DC
Start: 2008-03-06 — End: 2008-03-18

## 2008-03-06 NOTE — Telephone Encounter (Signed)
Medication Refill Documentation    Name of Medication: Vicodin     Prescribing provider:  Maryclare Labrador, MD    Protocol used (by medication type, not necessarily patient diagnosis): Adult:  CONTROLLED SUBSTANCES - Do not refill:  Send to provider as Rx Auth.    Date last filled:02/27/07    Date last seen for this issue:  02/05/08    Date last seen in clinic: 02/23/08  Was appt recommended:  YES    If not otherwise specified, must be seen once a year.    Next scheduled appointment date:  03/25/2008    If not scheduled, please contact patient to schedule, or note effort to do so:      Labs/levels done as indicated per protocol: N/A    Rx transmittal preference if approved: Electronically submit to pharmacy      Was Rx approved (to last until next appointment): NO    Refill until next visit due.  If mail order, give a minimum of 90 days.    Pt notification: pending provider response    Additional information for or from provider?

## 2008-03-06 NOTE — Telephone Encounter (Signed)
Refilled, please call in.

## 2008-03-06 NOTE — Telephone Encounter (Signed)
Phone call to pharmacy.  Rx called in.  Closing TE

## 2008-03-18 ENCOUNTER — Ambulatory Visit (INDEPENDENT_AMBULATORY_CARE_PROVIDER_SITE_OTHER): Payer: PRIVATE HEALTH INSURANCE | Admitting: Family Medicine

## 2008-03-18 ENCOUNTER — Encounter (INDEPENDENT_AMBULATORY_CARE_PROVIDER_SITE_OTHER): Payer: Self-pay | Admitting: Family Medicine

## 2008-03-18 VITALS — BP 120/76 | HR 60 | Temp 97.8°F | Resp 12 | Wt 144.0 lb

## 2008-03-18 MED ORDER — DICLOFENAC SODIUM 50 MG OR TBEC
DELAYED_RELEASE_TABLET | ORAL | Status: DC
Start: 2008-03-18 — End: 2008-08-16

## 2008-03-18 MED ORDER — VICODIN 5-500 MG OR TABS
ORAL_TABLET | ORAL | Status: DC
Start: 2008-03-18 — End: 2008-05-15

## 2008-03-18 NOTE — Progress Notes (Signed)
4:04 PM  Lisa Flynn is a 32 year old year old female who presents to clinic today for follow up of right low back pain.  See last note for details.  Pt has been attending PT and feels her symptoms are slowly improving.  Continues to have low back pain, right > left, feels tight muscles in the area and was told by PT that she needs to strengthen her core.  She is using 2400 mg ibuprofen daily and approximately 3 Vicodin per day.  Has been without medication for 2 days and symptom interfering with her function.   No new symptoms. No radicular symptoms.     (reviewed today):  Patient Active Problem List   Diagnoses Code   . 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 TOBACCO USE DISORDER 305.1   . HEADACHE 784.0          Current outpatient prescriptions:  NYSTATIN 100000 UNIT/GM EX OINT, apply to nipples twice daily  until rash resolves, Disp: 30 g, Rfl: 1;  PRENATAL 19 OR CHEW, 1 tablet by mouth daily, Disp: 100, Rfl: 3  VICODIN 5-500 MG OR TABS, Take 1 to 2  tablets every 4 to 6 hours as needed for pain up to 8 tablets per day, Disp: 60, Rfl: 0    O: VS BP 120/76  Pulse 60  Temp(Src) 97.8 F (36.6 C) (Oral)  Resp 12  Wt 144 lb (65.318 kg)  LMP Nursing  General appearance: healthy, alert, no distress.  Moves easily around exam room.  Lumbosacral spine area reveals mild right muscle tenderness. Painful and reduced LS ROM noted. Straight leg raise is negative at 90 degrees on both sides. DTR's, motor strength and sensation normal, i      A/P:  724.2 LOW BACK PAIN(LUMBAGO)  (primary encounter diagnosis):  C/w musculoskeletal origin.  Discussed risk of abuse and dependence with Vicodin.  Will try to decrease narcotic use by starting  DICLOFENAC SODIUM 50 MG OR TBEC daily (d/c ibuprofen use) and using VICODIN 5-500 MG OR TABS up to 3x daily.  Continue with PT and home exercises.  F/u if worsening or new symptoms, otherwise f/u prior to needing next refill.

## 2008-03-25 ENCOUNTER — Encounter (INDEPENDENT_AMBULATORY_CARE_PROVIDER_SITE_OTHER): Payer: Self-pay | Admitting: Family Medicine

## 2008-03-25 ENCOUNTER — Ambulatory Visit (INDEPENDENT_AMBULATORY_CARE_PROVIDER_SITE_OTHER): Payer: PRIVATE HEALTH INSURANCE | Admitting: Family Medicine

## 2008-03-25 VITALS — BP 96/64 | HR 76 | Temp 97.8°F | Resp 16 | Wt 141.0 lb

## 2008-03-25 NOTE — Progress Notes (Signed)
2:51 PM  Olivea is a 32 year old year old female  who presents to clinic today for IUD check.  She tolerated procedure well, mild cramping initially with 4 days of bleeding, no menses since but she is breastfeeding exclusively.  She can feel the strings but her partner does not feel during sexual intercourse.      Her back pain is improving.    (reviewed today):  Patient Active Problem List   Diagnoses Code   . 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 TOBACCO USE DISORDER 305.1   . HEADACHE 784.0         O: VS BP 96/64  Pulse 76  Temp(Src) 97.8 F (36.6 C) (Oral)  Resp 16  Wt 141 lb (63.957 kg)  LMP Nursing  General appearance: healthy, alert, no distress  GENITOURINARY:   *Ext. gen./vagina: external genitalia normal in appearance, no vaginal discharge seen, no cystocele or rectocele   *Urethra: normal   *Bladder: Normal   *Cervix: Normal in appearance, no cervical motion tenderness, IUD strings present and approx 4 cm long      A/P:  V25.42 IUD SURVEILLANCE  (primary encounter diagnosis):  Doing well, discussed routinely monitoring for strings monthly or after menses or pain.  Pt agrees. F/u prn.

## 2008-05-13 ENCOUNTER — Ambulatory Visit (INDEPENDENT_AMBULATORY_CARE_PROVIDER_SITE_OTHER): Payer: PRIVATE HEALTH INSURANCE | Admitting: Family Medicine

## 2008-05-13 LAB — CBC, DIFF
% Basophils: 0 % (ref 0–1)
% Eosinophils: 5 % (ref 0–7)
% Immature Granulocytes: 0 % (ref 0–1)
% Lymphocytes: 29 % (ref 19–53)
% Monocytes: 6 % (ref 5–13)
% Neutrophils: 60 % (ref 34–71)
Absolute Eosinophil Count: 0.43 10*3/uL (ref 0.00–0.50)
Absolute Lymphocyte Count: 2.69 10*3/uL (ref 1.00–4.80)
Basophils: 0.04 10*3/uL (ref 0.00–0.20)
Hematocrit: 38 % (ref 36–45)
Hemoglobin: 12.8 g/dL (ref 11.5–15.5)
Immature Granulocytes: 0.02 10*3/uL (ref 0.00–0.05)
MCH: 31.4 pg (ref 27.3–33.6)
MCHC: 33.4 g/dL (ref 32.2–36.5)
MCV: 94 fL (ref 81–98)
Monocytes: 0.52 10*3/uL (ref 0.00–0.80)
Neutrophils: 5.49 10*3/uL (ref 1.80–7.00)
Platelet Count: 264 10*3/uL (ref 150–400)
RBC: 4.08 mil/uL (ref 3.80–5.00)
RDW-CV: 13.6 % (ref 11.6–14.4)
WBC: 9.19 10*3/uL (ref 4.3–10.0)

## 2008-05-13 NOTE — Progress Notes (Signed)
Pt with depressed mood x 2 weeks.  Requesting visit.  Will obtain labs today and f/u visit on Wednesday.

## 2008-05-14 LAB — THYROID STIMULATING HORMONE: Thyroid Stimulating Hormone: 2.231 u[IU]/mL (ref 0.400–5.00)

## 2008-05-14 LAB — FOLATE & VITAMIN B12
Folate, SRM: >20 ng/mL (ref >5.2)
Folate, SRM: NORMAL
Vitamin B12 (Cobalamin): 632 pg/mL (ref 180–914)
Vitamin B12 (Cobalamin): NORMAL

## 2008-05-15 ENCOUNTER — Encounter (INDEPENDENT_AMBULATORY_CARE_PROVIDER_SITE_OTHER): Payer: Self-pay | Admitting: Family Medicine

## 2008-05-15 ENCOUNTER — Ambulatory Visit (INDEPENDENT_AMBULATORY_CARE_PROVIDER_SITE_OTHER): Payer: PRIVATE HEALTH INSURANCE | Admitting: Family Medicine

## 2008-05-15 VITALS — BP 102/58 | HR 76 | Temp 97.0°F | Resp 12 | Wt 135.0 lb

## 2008-05-15 LAB — VITAMIN D (25 HYDROXY)
Vit D (25_Hydroxy) Total: 22.2 ng/mL (ref 20.1–50.0)
Vit D (25_Hydroxy) Total: HIGH
Vitamin D2 (25_Hydroxy): <1 ng/mL
Vitamin D3 (25_Hydroxy): 22.2 ng/mL

## 2008-05-15 MED ORDER — ZOLOFT 25 MG OR TABS
ORAL_TABLET | ORAL | Status: DC
Start: 2008-05-15 — End: 2008-08-16

## 2008-05-15 NOTE — Progress Notes (Signed)
4:30 PM  Lisa Flynn is a 33 year old year old female  who presents to clinic today for discussion of low mood.  Pt describes feeling exhausted by the afternoon, daily sadness over the last few weeks with increased tearfulness, low motivation and lack of enjoyment in activities.  Symptoms started about the time that she was asked to come back to work, which she plans to do in about 4 weeks, with her mother taking care of her children.  She gets about 8 hours of sleep, interputted by 2 feedings.  Falls asleep easily.   Denies SI.  Had similar symptoms after the birth of her first child.  She has never been on antidepressants in the past.  Pt feels well supported by her family.    Pt has a 93 mo old and a 33 yo.   Has made some efforts on her own via looking on the internet for self-help ideas, has helped someone.      PHQ-9 score 9    (reviewed today):  Patient Active Problem List   Diagnoses Code   . 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 TOBACCO USE DISORDER 305.1   . HEADACHE 784.0   . PRESENCE OF IUD V45.51          Current outpatient prescriptions: DICLOFENAC SODIUM 50 MG OR TBEC, 1 TABLET 2 TIMES DAILY, Disp: 60, Rfl: 1;  NYSTATIN 100000 UNIT/GM EX OINT, apply to nipples twice daily  until rash resolves, Disp: 30 g, Rfl: 1;  PRENATAL 19 OR CHEW, 1 tablet by mouth daily, Disp: 100, Rfl: 3;       O: VS BP 102/58  Pulse 76  Temp(Src) 97 F (36.1 C) (Oral)  Resp 12  Wt 135 lb (61.236 kg)  LMP Nursing  casually dressed  Mood:  depressed with full range of affect  Speech was clear,coherent,fluent.    There was no evidence of thought disorder and there was no evidence of cognitive impairment.    The patient  was oriented x 3 and exhibited good judgement and good insight.     Suicidal ideation was denied      In Person Advice on 05/13/08   THYROID STIMULATING HORMONE   Component Value Range   . THYROID STIMULATING HORMONE 2.231  0.400 - 5.00 (uIU/mL)   CBC, DIFF   Component Value Range   . WBC 9.19  4.3 - 10.0 (THOU/uL)   . RBC  4.08  3.80 - 5.00 (mil/uL)   . HEMOGLOBIN 12.8  11.5 - 15.5 (g/dL)   . HEMATOCRIT 38  36 - 45 (%)   . MCV 94  81 - 98 (fL)   . MCH 31.4  27.3 - 33.6 (pg)   . MCHC 33.4  32.2 - 36.5 (g/dL)   . PLATELET COUNT 264  150 - 400 (THOU/uL)   . RDW-CV 13.6  11.6 - 14.4 (%)   . % Total Neut. 60  34 - 71 (%)   . % Lymphocytes 29  19 - 53 (%)   . % Monocytes 6  5 - 13 (%)   . % Eosinophils 5  0 - 7 (%)   . % BASOPHILS 0  0 - 1 (%)   . % Immature Granulocytes 0  0 - 1 (%)   . NEUTROPHILS 5.49  1.80 - 7.00 (THOU/uL)   . LYMPHOCYTES 2.69  1.00 - 4.80 (THOU/uL)   . MONOCYTES 0.52  0.00 - 0.80 (THOU/uL)   . EOSINOPHILS 0.43  0.00 -  0.50 (THOU/uL)   . BASOPHILS 0.04  0.00 - 0.20 (THOU/uL)   . Immature Granulocytes 0.02  0.00 - 0.05 (THOU/uL)   VITAMIN D (25 HYDROXY)   Component Value Range   . Vitamin D2 (25_Hydroxy) <1.0  (ng/mL)   . Vitamin D3 (25_Hydroxy) 22.2  (ng/mL)   . Vit D (25_Hydroxy) Total, Calc 22.2  20.1 - 50.0 (ng/mL)   . Vit D (25_Hydroxy) Total, Calc       . Vit D (25_Hydroxy) Total, Calc Severe deficiency: <8.0 ng/mL     . Vit D (25_Hydroxy) Total, Calc Deficient:         8.0-20.0 ng/mL     . Vit D (25_Hydroxy) Total, Calc Normal:            20.1-50.0 ng/mL     . Vit D (25_Hydroxy) Total, Calc High, indicates supplementation: 50.1-80.0 ng/mL     . Vit D (25_Hydroxy) Total, Calc Possible toxicity:  >80.0 ng/mL     . Vit D (25_Hydroxy) Total, Calc       . Vit D (25_Hydroxy) Total, Calc For more information please see:     Marland Kitchen Vit D (25_Hydroxy) Total, Calc        Value: http://[email or web address].[email or web address].[email or web address]   FOLATE & VITAMIN B12   Component Value Range   . FOLATE >20.0   > >5.2 (ng/mL)   . FOLATE Normal: >5.2 ng/mL     . FOLATE Acute Folate Deficiency: <3.0 ng/mL     . VITAMIN B12 (COBALAMIN) 632  180 - 914 (pg/mL)   . VITAMIN B12 (COBALAMIN)        Normal:  180-914 pg/mL     . VITAMIN B12 (COBALAMIN) Indeterminate:  145-179 pg/mL     . VITAMIN B12 (COBALAMIN)     Deficient:  <145 pg/mL              A/P:  296.30 DEPRESSIVE DISORDER RECURRENT,NOS  (primary encounter diagnosis):  Reviewed labs.   Pt with dysthymia, likely triggered by caring for her young children and stress of needing to return to work, frequent disruption of sleep.  Discussed good self care and trying to get regular exercise, have her husband help so she can get 1-2 days per week of 7-8 hours sleep.  I've explained to her that drugs of the SSRI class can have side effects such as weight gain, sexual dysfunction, insomnia, headache, nausea. These medications are generally effective at alleviating symptoms of anxiety and/or depression. Let me know if significant side effects do occur.  F/u within 3 weeks if deciding to take the medication, sooner if worsening or new symptoms.    Plan: ZOLOFT 25 MG OR TABS

## 2008-07-08 ENCOUNTER — Telehealth (INDEPENDENT_AMBULATORY_CARE_PROVIDER_SITE_OTHER): Payer: Self-pay | Admitting: Family Medicine

## 2008-07-08 NOTE — Telephone Encounter (Signed)
Form on Dr Benny Lennert desk.

## 2008-07-08 NOTE — Telephone Encounter (Signed)
Pt faxed over a [employer] Work Evaluation for Dr. Kyra Searles to fill out. Form will be in the form's box.

## 2008-07-08 NOTE — Telephone Encounter (Signed)
Form filled out and put in fax box.

## 2008-08-15 ENCOUNTER — Other Ambulatory Visit (INDEPENDENT_AMBULATORY_CARE_PROVIDER_SITE_OTHER): Payer: Self-pay | Admitting: Family Medicine

## 2008-08-15 ENCOUNTER — Other Ambulatory Visit: Payer: Self-pay

## 2008-08-15 NOTE — Telephone Encounter (Signed)
Patient scheduled appointment for tomorrow with Dr Kyra Searles and will follow-up on all issues at that time.

## 2008-08-16 ENCOUNTER — Encounter (INDEPENDENT_AMBULATORY_CARE_PROVIDER_SITE_OTHER): Payer: Self-pay | Admitting: Family Medicine

## 2008-08-16 ENCOUNTER — Ambulatory Visit (INDEPENDENT_AMBULATORY_CARE_PROVIDER_SITE_OTHER): Payer: PRIVATE HEALTH INSURANCE | Admitting: Family Medicine

## 2008-08-16 VITALS — BP 104/62 | HR 76 | Temp 98.2°F | Resp 12 | Wt 123.0 lb

## 2008-08-16 MED ORDER — DICLOFENAC SODIUM 50 MG OR TBEC
DELAYED_RELEASE_TABLET | ORAL | Status: DC
Start: 2008-08-16 — End: 2009-02-27

## 2008-08-16 MED ORDER — VICODIN 5-500 MG OR TABS
ORAL_TABLET | ORAL | Status: DC
Start: 2008-08-16 — End: 2009-02-27

## 2008-08-16 NOTE — Progress Notes (Signed)
2:11 PM  Lisa Flynn is a 33 year old year old female who presents to clinic today for the following:    1.  Lower back pain:  Chronic.  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  Describes aching pain in mid and upper lower back, worse with lifting or twisting, may radiate slight to sides.  No radicular symptoms.  Has lost control of urine 2x at home, she attributed to her vaginal deliveries. She has noticed numbness and heaviness in upper anterior bilateral legs when she stands after sitting sometimes, no clear trigger.  Symptoms severe enough that she needs to hold on to something to get up.  No recent injury, but had lower back injury 8 years ago related to heavy lifting.  Improved with changing her chair at work,       (reviewed today):  Patient Active Problem List   Diagnoses Code   . 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 TOBACCO USE DISORDER 305.1   . HEADACHE 784.0   . PRESENCE OF IUD V45.51          Current outpatient prescriptions: Diclofenac Sodium 50 MG OR TBEC, 1 TABLET 2 TIMES DAILY, Disp: 60, Rfl: 1;  HYDROCODONE-APAP (VICODIN) 5-500 MG OR TABS, Take 1 to 2  tablets every 4 to 6 hours as needed for pain up to 8 tablets per day, Disp: 60, Rfl: 0;  PRENATAL 19 OR CHEW, 1 tablet by mouth daily, Disp: 100, Rfl: 3    O: VS BP 104/62  Pulse 76  Temp(Src) 98.2 F (36.8 C) (Oral)  Resp 12  Wt 123 lb (55.792 kg)  LMP 07/25/2008  General appearance: healthy, alert, no distress  Lumbosacral spine area reveals local tenderness approx L1.  Painful and reduced LS ROM noted. Straight leg raise is negative at 90 degrees on both sides. DTR's, motor strength and sensation normal    Lumbar xray: normal, IUD in expected position.      A/P:  724.5 BACKACHE NOS  (primary encounter diagnosis):  Suspect disc herniation, but no clear radicular symptoms.  As pt appeared to do well with rest and home exercises, will start pt on NSAIDs and further her home exercises (PT not covered by her insurance) and rx'd VICODIN  5-500 to use for more severe pain.  F/u if not improving within 2-3 weeks, sooner if worsening, would consider pursuing MRI at that time.  Plan: X-RAY LUMBAR SPINE 2 VW

## 2008-08-19 LAB — PR RADEX SPINE LUMBOSACRAL 2/3 VIEWS

## 2008-10-21 ENCOUNTER — Encounter (INDEPENDENT_AMBULATORY_CARE_PROVIDER_SITE_OTHER): Payer: Self-pay | Admitting: Family Medicine

## 2008-10-22 NOTE — Telephone Encounter (Signed)
ecared the clinic numbers.

## 2008-10-22 NOTE — Telephone Encounter (Signed)
Can we email the contact information for the [place] clinics to this pt?  Thanks.

## 2008-10-25 ENCOUNTER — Ambulatory Visit (INDEPENDENT_AMBULATORY_CARE_PROVIDER_SITE_OTHER): Payer: PRIVATE HEALTH INSURANCE | Admitting: Nurse Practitioner

## 2008-10-25 ENCOUNTER — Encounter (INDEPENDENT_AMBULATORY_CARE_PROVIDER_SITE_OTHER): Payer: Self-pay | Admitting: Nurse Practitioner

## 2008-10-25 ENCOUNTER — Ambulatory Visit (INDEPENDENT_AMBULATORY_CARE_PROVIDER_SITE_OTHER): Payer: Self-pay | Admitting: Family Medicine

## 2008-10-25 VITALS — BP 98/60 | HR 85 | Temp 99.0°F | Resp 12 | Wt 117.7 lb

## 2008-10-25 MED ORDER — RITALIN 10 MG OR TABS
ORAL_TABLET | ORAL | Status: DC
Start: 2008-10-25 — End: 2008-10-25

## 2008-10-25 MED ORDER — RITALIN 10 MG OR TABS
ORAL_TABLET | ORAL | Status: DC
Start: 2008-10-25 — End: 2008-11-15

## 2008-10-25 NOTE — Progress Notes (Signed)
Lisa Flynn is a 33 year old female who presents for evaluation of possible   ADHD.      Who requested this evaluation? patient    Why is evaluation being requested? Written up at work for the 2nd time, for not paying attention in meetings and not completing tasks    Evaluated for this problem before? NO    Any prior educational evaluation? NO    History:  Birth History:     Complications of pregnancy: NO       Maternal medications or alcohol/tobacco/drug use during pregnancy? NO     Complications of labor and delivery: NO     Complications in neonatal period: NO      Developmental History:  Normal development     Current functioning: normal    School Performance History: teacher helped her diploma in high school in [place] but here in the [place] failed     Any school evaluation done to date: NO    Family History of attention problems: YES: older brother's two kids have ADD                    of developmental problems: NO                 of seizure disorders: NO                 of psychiatric problems: NO                 other significant family hx: NO           Past Medical History: History of frequent otitis? NO                        Other chronic or significant medical issues?                              YES: back pain         Social history: Who lives at home? Husband and two children    Who are friends? One best friend    Recent move, change in work: NO    Recent change in family structure: NO    Recent significant life events: NO    Any history of physical or sexual abuse: NO    ROS:     Ability to attend to details? NO-start but then get side tracked, will go back in a couple days and eventually complete       Problems with careless mistakes? YES:          Ability to sustain attention to school/work? No    Ability to sustain attention to activities? YES    Ability to sustain attention to self care activities?   NO-forget to brush hair      Forgetfulness? YES: keys, cell phone         Ability to complete  activities? YES but will take time      Organization skills? NO-      Lose things? YES:          Distractibility? YES:          Good listener? NO-      Ability to follow instructions? YES      Fidgeting/excess physical activity? YES: especially in church , space out and day dream about stuff      Leaves seat in work? YES:  Talkative? YES: only with people I know         Preferred play activities/activity level in play? Fishing      Ability to wait in lines, take turns playing games? YES      Ability to take turns in conversations? YES      Answers before question completed? YES: most of the time         Staring spells? YES: in church       Settings in which symptoms experienced? at home, work, church, friends    When were symptoms first noted? High school    How does this impact:  school work? Had to have close help to complete                         home life?  Husband used to get upset with her for not paying bills but he is used to it                         relationships with peers? ok                           Work? Written up twice     Patient Active Problem List   Diagnoses Code   . 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 TOBACCO USE DISORDER 305.1   . HEADACHE 784.0   . PRESENCE OF IUD V45.51   . CONTROLLED SUBSTANCES PLAN ON FILE 2015   . 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  Past Surgical History   Procedure Date   .       negative       Family History   Problem Relation   . Cancer No Hx Of   . Heart disease No Hx Of   . Hypertension No Hx Of   . Stroke Maternal Grandfather     stroke at 58   . Diabetes Mother   . Diabetes Paternal Grandfather   . ADHD Child     nephew   . ADHD Child     nephew       History   Social History   . Marital Status: Married     Spouse Name: Pavel     Number of Children: 0   . 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 Tobacco Use: Yes -- 15 years      pt smokes about 7 cig per day.   . Alcohol Use:  No          . Drug Use: Not on file   . Sexually Active: Yes -- Female partner(s)     Birth Control/ Protection: IUD   Other Topics Concern   . Not on file   Social History Narrative   . No narrative on file         PE:   BP 98/60  Pulse 85  Temp(Src) 99 F (37.2 C) (Oral)  Resp 12  Wt 117 lb 11.2 oz (53.388 kg)  SpO2 97%  CONSTITUTIONAL/GENERAL:  Appearance:  Well developed, appearing stated age and in no acute distress,  Facial features:     External ears and nose are normal in appearance without significant scars or asymmetry., Weight appears normal for height, Gait and station:  Normal..         Evaluation of forms: adult self report  Inattention = 32/36  Hyperactivity = 22/36    IMPRESSION:  Spent 45 w/ pt w/ >50% in counseling, discussing progress, consultation regarding HPI, exam, review of differential diagnoses, rationale for decision-making and use of medications including answering all questions and concerns    314.00 ATTN DEFICIT NONHYPERACT  (primary encounter diagnosis)  2015 CONTROLLED SUBSTANCES PLAN ON FILE  Comment: discussed diagnosis, treatment rational, medication use and SE. Handout from epocrates given.   Plan: RITALIN 10 MG OR TABS            Medication trial started: YES: ritalin, see orders    Return for follow-up in 3-4 weeks.    Dx, Tx, risks and alternatives discussed with patient and questions answered. Return to clinic if symptoms increase, persist, or worsen.

## 2008-10-25 NOTE — Progress Notes (Signed)
Adult Self-Report Scale Symptom Checklist - description of how pt felt or conducted self during previous 6 months, completed by patient at today's visit:     Part A - inattentiveness  * Makes careless mistakes on boring or difficult project?  4  * Has difficulty keeping attention when doing boring or repetitive work? 4  * Has difficulty concentrating on what people say to you, even when speaking directly to you? 3  * How often have trouble wrapping up the final details of a project, once the challenging parts have been done? 4  * Difficulty getting things in order when you have to do a task that requires organization? 3  * With a task that requires a lot of thought, how often do you avoid or delay getting started? 3  * How often do you misplace or have difficulty finding things at home or at work? 4  * How often are your distracted by activity or noise around you? 4  * How often do you have problem remembering appointments or obligations? 3    Part A Total:  32   [very often = 4, often = 3, sometimes = 2, etc]    Part B - hyperactivity    * Fidget or squirm with hands or feet when have to sit down for a long time? 3  * How often leave seat in meetings or other situations in which you are expected to remain seated?  2  * How often do you feel restless or fidgety?did not answer  * How often do you have difficulty unwinding and relaxing when you have time to yourself? 3  * How often do you feel overlay active and compelled to do things, like you were driven by a motor? 2  * How often do you find yourself talking too much when you are in  social situations? 1  * How often do you find yourself finishing the sentences of the people you are talking to, before they can finish them themselves? 4  * How often do you have difficulty waiting your turn in situations when turn taking is required? 3  * How often do you interrupt other when they are busy? 4     Part B total:22

## 2008-10-25 NOTE — Patient Instructions (Signed)
CONTROLLED SUBSTANCES USE PLAN-[facility]    Your Provider has prescribed you a medication which has been designated by the [employer] as a controlled substance. In the interest of safe and proper use of this medication, the providers at this clinic have adopted the following guidelines for new prescriptions and refills of controlled substances:    WHAT WE EXPECT FROM YOU:  1) Each prescription contains specific instructions as to how to use the medication, as well as the quantity (number) to be dispensed. We refer to this as the "schedule" for the medication. We expect you to follow these instructions closely, and not to use the medication other than according to the instructions, unless you have first discussed changes with your provider    2) We require that you use one pharmacy only for filling and refilling of these prescriptions. This information has been recorded in your medical record.  If you should need to change pharmacies, please let us know in advance.     3) We expect that you will not receive prescriptions for any otherADHD/ADD medicines from other providers in this clinic or any other clinic or hospital or emergency room, unless you notify your provider first. This is considered duplication of prescriptions, which can result in potentially serious complications.    4) In accordance with state law, sharing of prescription drugs with others is forbidden.      5) Refills will be considered on a case by case basis.  We request the courtesy of at least 48-hour advance notice for refills.    **Refill prescriptions must be picked up at the clinic, and will not be called or faxed the pharmacy, unless you have made a specific arrangement for this with the prescribing provider  **Refill requests will not be approved on Friday afternoons or weekends.    **Refills, if approved, will be based on the most recent schedule for that particular  medication  **Requests for early refills due to unusual circumstances (for example, loss or theft of pill bottle or prescription) will not be approved.  It is very important that you take great care in the storing and  transporting these medications.           You cannot use or abuse illegal substances while your ADD/ADHD is being managed by the physician.    ** If we learn that you have violated one or more of the above conditions, we will consider it a       breach of this agreement.  This will result in termination of prescription privileges, and may result      in termination of care.    WHAT YOU CAN EXPECT FROM YOUR PROVIDER:   It is responsibility of your provider to discuss with you the therapeutic goals and anticipated benefits of the prescription medication, as well as the potential risks and side effects.  To assist [place] in this educational process, please feel free to ask questions and share any concerns that you may have about your prescription(s).       Signed on 10/25/2008    Harold Barban  Signature:__________________________________    Medical provider: Laury Axon, ARNP  Signature:__________________________________

## 2008-10-31 ENCOUNTER — Encounter (INDEPENDENT_AMBULATORY_CARE_PROVIDER_SITE_OTHER): Payer: Self-pay | Admitting: Family Medicine

## 2008-10-31 NOTE — Telephone Encounter (Signed)
Read note and advise

## 2008-11-15 ENCOUNTER — Ambulatory Visit (INDEPENDENT_AMBULATORY_CARE_PROVIDER_SITE_OTHER): Payer: PRIVATE HEALTH INSURANCE | Admitting: Nurse Practitioner

## 2008-11-15 ENCOUNTER — Encounter (INDEPENDENT_AMBULATORY_CARE_PROVIDER_SITE_OTHER): Payer: Self-pay | Admitting: Nurse Practitioner

## 2008-11-15 VITALS — HR 68 | Temp 98.7°F | Wt 114.0 lb

## 2008-11-15 MED ORDER — RITALIN 10 MG OR TABS
ORAL_TABLET | ORAL | Status: DC
Start: 2008-11-15 — End: 2009-01-03

## 2008-11-15 NOTE — Progress Notes (Signed)
Lisa Flynn is a 33 year old female who comes in with today for a chief concern of ADD follow-up. Feeling 80 % better from last appointment.  Current Medication(s): ritalin in the am and again around 6 pm  Overall Lisa Flynn is doing better  Sleep: good, going to bed 11 wake in the am 0730. No trouble falling asleep or staying asleep   Diet and weight: stable  work: boss is happy with her, ready to go and more focused in the am  Tics:urinate more often, not sure if related to medication.    Patient Active Problem List   Diagnoses Code   . 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 TOBACCO USE DISORDER 305.1   . HEADACHE 784.0   . PRESENCE OF IUD V45.51   . CONTROLLED SUBSTANCES PLAN ON FILE 2015   . 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  Past Surgical History   Procedure Date   .       negative       Family History   Problem Relation   . Cancer No Hx Of   . Heart disease No Hx Of   . Hypertension No Hx Of   . Stroke Maternal Grandfather     stroke at 25   . Diabetes Mother   . Diabetes Paternal Grandfather   . ADHD Child     nephew   . ADHD Child     nephew         History   Social History   . Marital Status: Married     Spouse Name: Lisa Flynn     Number of Children: 0   . 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 Tobacco Use: Yes -- 15 years      pt smokes about 7 cig per day.   . Alcohol Use: No          . Drug Use: Not on file   . Sexually Active: Yes -- Female partner(s)     Birth Control/ Protection: IUD   Other Topics Concern   . Not on file   Social History Narrative   . No narrative on file         O:  Pulse 68  Temp 98.7 F (37.1 C)  Wt 114 lb (51.71 kg)  LMP 11/13/2008  CONSTITUTIONAL/GENERAL:  Appearance:  Well developed, appearing stated age and in no acute distress,  Facial features:     External ears and nose are normal in appearance without significant scars or asymmetry., Weight appears normal for height, Gait and station:   Normal..  Lungs: Bilat equal breath sounds with no rales, rhonchi or wheeze.  CVS exam: S1, S2 normal, no murmur, click, rub or gallop, regular rate and rhythm.      A/P:   Spent 15 w/ pt w/ >50% in counseling, discussing progress, consultation regarding HPI, exam, review of differential diagnoses, rationale for decision-making and use of medications including answering all questions and concerns    314.00 ATTN DEFICIT NONHYPERACT  (primary encounter diagnosis)  2015 CONTROLLED SUBSTANCES PLAN ON FILE  Comment: dose increase, discussed rational and possible side effects  Plan: RITALIN 10 MG OR TABS        RTC 3-4 weeks      Dx, Tx, risks and alternatives discussed with patient and questions answered. Return to clinic if  symptoms increase, persist, or worsen.

## 2008-12-13 ENCOUNTER — Encounter (INDEPENDENT_AMBULATORY_CARE_PROVIDER_SITE_OTHER): Payer: Self-pay | Admitting: Nurse Practitioner

## 2008-12-13 ENCOUNTER — Ambulatory Visit (INDEPENDENT_AMBULATORY_CARE_PROVIDER_SITE_OTHER): Payer: PRIVATE HEALTH INSURANCE | Admitting: Nurse Practitioner

## 2008-12-13 VITALS — BP 124/76 | HR 82 | Temp 99.1°F | Wt 117.0 lb

## 2008-12-13 MED ORDER — RITALIN 20 MG OR TABS
ORAL_TABLET | ORAL | Status: DC
Start: 2008-12-22 — End: 2009-01-03

## 2008-12-13 NOTE — Patient Instructions (Signed)
9/4-9/10/10 take Ritalin 15mg  (1 1/2 tablets) in the am and at lunch, 10mg  in the evening    12/21/08 start 20mg  in the am and at lunch, 10mg  (1/2 tablet) in the evening

## 2008-12-13 NOTE — Progress Notes (Signed)
Lisa Flynn is a 33 year old female who comes in for a chief concern of ADD follow-up.   Current Medication(s): feeling like it is not working, 3 weeks after last appt medication seems not to be working. The last 2 weeks feels like it is not working. Feeling like she is back to her old self.   Overall Lisa Flynn is doing ok, feels in the past she requires more medication than most people. 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  Sleep: good  Diet and weight: good  Work: feeling back to "[employer]". People are saying she is interrupting again and her "to do" list is getting longer.  Tics/ Side effects: none    She has 30 tablets left of 10 mg ritalin.    Patient Active Problem List   Diagnoses Code   . 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 TOBACCO USE DISORDER 305.1   . HEADACHE 784.0   . PRESENCE OF IUD V45.51   . CONTROLLED SUBSTANCES PLAN ON FILE 2015   . 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  Past Surgical History   Procedure Date   .       negative       Family History   Problem Relation   . Cancer No Hx Of   . Heart disease No Hx Of   . Hypertension No Hx Of   . Stroke Maternal Grandfather     stroke at 69   . Diabetes Mother   . Diabetes Paternal Grandfather   . ADHD Child     nephew   . ADHD Child     nephew       History   Social History   . Marital Status: Married     Spouse Name: Lisa Flynn     Number of Children: 0   . 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 Tobacco Use: Yes -- 15 years      pt smokes about 7 cig per day.   . Alcohol Use: No          . Drug Use: Not on file   . Sexually Active: Yes -- Female partner(s)     Birth Control/ Protection: IUD   Other Topics Concern   . Not on file   Social History Narrative   . No narrative on file         O:  BP 124/76  Pulse 82  Temp 99.1 F (37.3 C)  Wt 117 lb  (53.071 kg)  LMP 11/13/2008  CONSTITUTIONAL/GENERAL:  Appearance:  Well developed, appearing stated age and in no acute distress,  Facial features:     External ears and nose are normal in appearance without significant scars or asymmetry., Weight appears normal for height, Gait and station:  Normal..  Lungs: Bilat equal breath sounds with no rales, rhonchi or wheeze.  CVS exam: S1, S2 normal, no murmur, click, rub or gallop, regular rate and rhythm.      A/P:   Spent 25 w/ pt w/ >50% in counseling, discussing progress, consultation regarding HPI, exam, review of differential diagnoses, rationale for decision-making and use of medications including answering all questions and concerns  314.00 ATTN DEFICIT NONHYPERACT  (primary encounter diagnosis)  2015 CONTROLLED SUBSTANCES PLAN ON FILE  Comment: dose increase, see pt instructions. Discussed medication use and SE. She is to let me know if she has any problems before her next follow up appt in 3-4 weeks. If medication not helping at follow up we will make a medication change. She is agreeable to this plan  Plan: RITALIN 20 MG OR TABS                    Dx, Tx, risks and alternatives discussed with patient and questions answered. Return to clinic if symptoms increase, persist, or worsen.

## 2009-01-03 ENCOUNTER — Ambulatory Visit (INDEPENDENT_AMBULATORY_CARE_PROVIDER_SITE_OTHER): Payer: PRIVATE HEALTH INSURANCE | Admitting: Nurse Practitioner

## 2009-01-03 ENCOUNTER — Encounter (INDEPENDENT_AMBULATORY_CARE_PROVIDER_SITE_OTHER): Payer: Self-pay | Admitting: Nurse Practitioner

## 2009-01-03 VITALS — BP 116/68 | HR 80 | Temp 98.7°F | Wt 116.0 lb

## 2009-01-03 MED ORDER — RITALIN 20 MG OR TABS
ORAL_TABLET | ORAL | Status: DC
Start: 2009-01-21 — End: 2009-04-18

## 2009-01-03 MED ORDER — RITALIN 20 MG OR TABS
ORAL_TABLET | ORAL | Status: DC
Start: 2009-02-21 — End: 2009-04-18

## 2009-01-03 NOTE — Progress Notes (Signed)
Lisa Flynn is a 33 year old female who comes in today for a chief concern of ADD follow-up.   Current Medication(s): ritalin 20mg  in am and 20mg  in the early afternoon and 10mg  before 6pm  Overall Lisa Flynn is doing good. Feeling like symptoms dramatically improved from last Monday to this Wednesday, 100% improvement.  Sleep: good  Diet and weight: stable  Work: very concentrated, hasn't been late, even with earlier shift at work, caught up with work. Lisa Flynn sees improvement but still disappointed, but getting better.  Tics/side effects: some nervousness before speeches    Patient Active Problem List   Diagnoses Code   . 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 TOBACCO USE DISORDER 305.1   . HEADACHE 784.0   . PRESENCE OF IUD V45.51   . CONTROLLED SUBSTANCES PLAN ON FILE 2015   . 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  Past Surgical History   Procedure Date   .       negative       Family History   Problem Relation   . Cancer No Hx Of   . Heart disease No Hx Of   . Hypertension No Hx Of   . Stroke Maternal Grandfather     stroke at 33   . Diabetes Mother   . Diabetes Paternal Grandfather   . ADHD Child     nephew   . ADHD Child     nephew       History   Social History   . Marital Status: Married     Spouse Name: Pavel     Number of Children: 0   . 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 Tobacco Use: Yes -- 15 years      pt smokes about 7 cig per day.   . Alcohol Use: No          . Drug Use: Not on file   . Sexually Active: Yes -- Female partner(s)     Birth Control/ Protection: IUD   Other Topics Concern   . Not on file   Social History Narrative   . No narrative on file         O:  BP 116/68  Pulse 80  Temp 98.7 F (37.1 C)  Wt 116 lb (52.617 kg)  LMP 12/14/2008  CONSTITUTIONAL/GENERAL:  Appearance:  Well developed, appearing stated age and in no acute distress,  Facial features:     External ears and nose are normal in appearance without  significant scars or asymmetry., Weight appears normal for height, Gait and station:  Normal..  Lungs: Bilat equal breath sounds with no rales, rhonchi or wheeze.  CVS exam: S1, S2 normal, no murmur, click, rub or gallop, regular rate and rhythm.      A/P:   314.00 ATTN DEFICIT NONHYPERACT  (primary encounter diagnosis)  2015 CONTROLLED SUBSTANCES PLAN ON FILE  Comment: she is happy with current regime, continue current doses. RTC 2 months  Plan: RITALIN 20 MG OR TABS              Dx, Tx, risks and alternatives discussed with patient and questions answered. Return to clinic if symptoms increase, persist, or worsen.

## 2009-02-27 ENCOUNTER — Encounter (INDEPENDENT_AMBULATORY_CARE_PROVIDER_SITE_OTHER): Payer: Self-pay | Admitting: Nurse Practitioner

## 2009-02-27 ENCOUNTER — Ambulatory Visit (INDEPENDENT_AMBULATORY_CARE_PROVIDER_SITE_OTHER): Payer: PRIVATE HEALTH INSURANCE | Admitting: Nurse Practitioner

## 2009-02-27 VITALS — BP 138/80 | HR 108 | Temp 98.2°F | Resp 12 | Wt 114.0 lb

## 2009-02-27 MED ORDER — CONCERTA 18 MG OR TBCR
EXTENDED_RELEASE_TABLET | ORAL | Status: DC
Start: 2009-02-27 — End: 2009-04-18

## 2009-02-27 NOTE — Progress Notes (Signed)
Lisa Flynn is a 33 year old female who comes in today for a chief concern of ADD  follow-up. Feeling the medicaiton has stopped working, first the afternoon dose stopped working, 3 weeks ago and then 2 weeks ago the am dose stopped working.   Current Medication(s): ritalin three times a day  Overall Namiko is doing now noticing house is not clean, not doing well with job and back to play a game she used to play before starting medication.   Sleep: ok  Diet and weight: stable  Work: as above  Tics/side effects:once in a blue moon feeling fast heart rate.   Coffee in the am one 16 oz cup. On the way here she had coffee and tobacco.    Patient Active Problem List   Diagnoses Code   . 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 TOBACCO USE DISORDER 305.1   . HEADACHE 784.0   . PRESENCE OF IUD V45.51   . CONTROLLED SUBSTANCES PLAN ON FILE 2015   . 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 Marital Status: Married     Spouse Name: Pavel     Number of Children: 0   . 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 Tobacco Use: Yes -- 15 years      pt smokes about 7 cig per day.   . Alcohol Use: No          . Drug Use: Not on file   . Sexually Active: Yes -- Female partner(s)     Birth Control/ Protection: IUD   Other Topics Concern   . Not on file   Social History Narrative   . No narrative on file         O:  BP 138/80  Pulse 108  Temp 98.2 F (36.8 C)  Resp 12  Wt 114 lb (51.71 kg)  LMP 01/27/2009  CONSTITUTIONAL/GENERAL:  Appearance:  Well developed, appearing stated age and in no acute distress,  Facial features:     External ears and nose are normal in appearance without significant scars or asymmetry., Weight appears normal for height, Gait and station:  Normal..  Lungs: Bilat equal breath sounds with no rales, rhonchi or wheeze.  CVS exam: S1, S2 normal, no murmur, click, rub or gallop, tachy rate and rhythm.      A/P:    Spent 25 w/ pt w/ >50% in  counseling, discussing progress, consultation regarding HPI, exam, review of differential diagnoses, rationale for decision-making and use of medications including answering all questions and concerns    314.00 ATTN DEFICIT NONHYPERACT  (primary encounter diagnosis)  Comment: stop ritalin. Start concerta, discussed rational for changes, medication use and SE. Handout from epocrates reviewed and given. RTC 3 weeks, sooner if needed  Plan: CONCERTA 18 MG OR TBCR                  Dx, Tx, risks and alternatives discussed with patient and questions answered. Return to clinic if symptoms increase, persist, or worsen.

## 2009-02-28 ENCOUNTER — Ambulatory Visit (INDEPENDENT_AMBULATORY_CARE_PROVIDER_SITE_OTHER): Payer: PRIVATE HEALTH INSURANCE | Admitting: Nurse Practitioner

## 2009-03-21 ENCOUNTER — Encounter (INDEPENDENT_AMBULATORY_CARE_PROVIDER_SITE_OTHER): Payer: Self-pay | Admitting: Nurse Practitioner

## 2009-03-21 ENCOUNTER — Ambulatory Visit (INDEPENDENT_AMBULATORY_CARE_PROVIDER_SITE_OTHER): Payer: PRIVATE HEALTH INSURANCE | Admitting: Nurse Practitioner

## 2009-03-21 VITALS — BP 112/56 | HR 88 | Temp 98.6°F | Resp 12 | Wt 116.0 lb

## 2009-03-21 MED ORDER — CONCERTA 27 MG OR TBCR
EXTENDED_RELEASE_TABLET | ORAL | Status: DC
Start: 2009-03-21 — End: 2009-05-19

## 2009-03-21 MED ORDER — RITALIN 10 MG OR TABS
ORAL_TABLET | ORAL | Status: DC
Start: 2009-03-21 — End: 2009-05-19

## 2009-03-21 NOTE — Progress Notes (Signed)
Lisa Flynn is a 33 year old female who comes in today for a chief concern of ADD  follow-up.   Current Medication(s): concerta, she is not feeling any difference. A couple hours after lunch feeling a buzzy symptom in her brain. She felt this way before taking ritalin. She is not able to stay awake past 9pm.   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  History   Social History   . Marital Status: Married     Spouse Name: Pavel     Number of Children: 0   . 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 Tobacco Use: Yes -- 15 years      pt smokes about 7 cig per day.   . Alcohol Use: No          . Drug Use: Not on file   . Sexually Active: Yes -- Female partner(s)     Birth Control/ Protection: IUD   Other Topics Concern   . Not on file   Social History Narrative   . No narrative on file         O:  BP 112/56  Pulse 88  Temp(Src) 98.6 F (37 C) (Temporal)  Resp 12  Wt 116 lb (52.617 kg)  SpO2 99%  CONSTITUTIONAL/GENERAL:  Appearance:  Well developed, appearing stated age and in no acute distress,  Facial features:     External ears and nose are normal in appearance without significant scars or asymmetry., Weight appears normal for height, Gait and station:  Normal..  Lungs: Bilat equal breath sounds with no rales, rhonchi or wheeze.  CVS exam: S1, S2 normal, no murmur, click, rub or gallop, regular rate and  rhythm.      A/P:   314.00 ATTN DEFICIT NONHYPERACT  (primary encounter diagnosis)  Comment: dose increase of concerta and add ritalin in the afternoon. Plan to follow up in 3-4 weeks  Plan: CONCERTA 27 MG OR TBCR, RITALIN 10 MG OR TABS                Dx, Tx, risks and alternatives discussed with patient and questions answered. Return to clinic if symptoms increase, persist, or worsen.

## 2009-04-18 ENCOUNTER — Encounter (INDEPENDENT_AMBULATORY_CARE_PROVIDER_SITE_OTHER): Payer: Self-pay | Admitting: Nurse Practitioner

## 2009-04-18 ENCOUNTER — Ambulatory Visit (INDEPENDENT_AMBULATORY_CARE_PROVIDER_SITE_OTHER): Payer: PRIVATE HEALTH INSURANCE | Admitting: Nurse Practitioner

## 2009-04-18 VITALS — BP 132/64 | HR 84 | Temp 98.5°F | Wt 113.0 lb

## 2009-04-18 MED ORDER — METHYLPHENIDATE HCL ER (OSM) 36 MG OR TBCR
EXTENDED_RELEASE_TABLET | ORAL | Status: DC
Start: 2009-04-21 — End: 2009-05-19

## 2009-04-18 NOTE — Progress Notes (Signed)
Lisa Flynn is a 34 year old female who comes in today for a chief concern of ADD/ADHD follow-up.     Patient feels that she had a good month. She states that she is not 100% productive yet, but has noticed about 40% improvement since last month.  She is now able do projects that take less than 20 min without distraction.     Buzzing in her head is almost gone. Occasionally notices it before afternoon dose of ritalin.      Current Medication(s): Concerta 27mg  and Ritalin 10mg  in the afternoon/ early evening.   Overall Lisa Flynn is doing well.   Sleep: sleep is much improved. Going to bed a normal time which is 11pm. Feeling awake enough in the evening but no problems falling asleep when it comes to bedtime.    Diet and weight: appetite and weight stable.   Tics:Denies  Other SE: denies    Quit smoking 7 years ago cold [place] and is planning again. On last pack of cigarettes then quitting. Husband smokes outside. She does not want to try to quit with him at this point because they seem to bring each other down. She hopes to be a good influence on him and he will choose to quit on his own.     O:  BP 132/64  Pulse 84  Temp 98.5 F (36.9 C)  Wt 113 lb (51.256 kg)  LMP 03/31/2009  CONSTITUTIONAL/GENERAL:  Appearance:  Well developed, appearing stated age and in no acute distress, Weight appears normal for height, Gait and station:  Normal..  RESPIRATORY:  Inspection:  normal respiratory effort and chest wall movement with respiration, Auscultation:  Clear lung fields without crackles, wheezing or expiratory prolongation.  CARDIAC:  Auscultation:  Regular rate and rhythm.  S1 and S2 are normal.  No murmurs, gallops or rubs.    A:  314.00 ATTN DEFICIT NONHYPERACT  (primary encounter diagnosis)  Comment: patient doing well with room for improvement. Increase dose of Concerta. Continue with ritalin in afternoon.   Plan:   1. Begin METHYLPHENIDATE HCL 36 MG OR TBCR daily.   2. Continue Ritalin 10mg  in the afternoon.  Patient still has enough pills left from her previous 20mg  rx. She is cutting the pills in half.  3. Follow up in 3-4 weeks, sooner as needed.     Praised patient for decision to quit smoking. Will follow up at the next visit.     Over 50% of today's visit was spent in coordination of care and counseling: discussing the potential diagnoses, the prognoses, and the treatment plan, including future management options if initial plan not fully addressing symptoms. All questions and concerns addressed. Total time spent with patient = 20 min.

## 2009-05-02 NOTE — Progress Notes (Signed)
Girl by u/s

## 2009-05-19 ENCOUNTER — Ambulatory Visit (INDEPENDENT_AMBULATORY_CARE_PROVIDER_SITE_OTHER): Payer: PRIVATE HEALTH INSURANCE | Admitting: Nurse Practitioner

## 2009-05-19 ENCOUNTER — Encounter (INDEPENDENT_AMBULATORY_CARE_PROVIDER_SITE_OTHER): Payer: Self-pay | Admitting: Nurse Practitioner

## 2009-05-19 VITALS — BP 112/70 | HR 94 | Temp 99.1°F | Wt 109.0 lb

## 2009-05-19 MED ORDER — METHYLPHENIDATE HCL 10 MG OR TABS
ORAL_TABLET | ORAL | Status: DC
Start: 2009-05-19 — End: 2009-07-15

## 2009-05-19 MED ORDER — METHYLPHENIDATE HCL ER (OSM) 36 MG OR TBCR
EXTENDED_RELEASE_TABLET | ORAL | Status: DC
Start: 2009-05-19 — End: 2010-01-08

## 2009-05-19 NOTE — Progress Notes (Signed)
Lisa Flynn is a 34 year old female who comes in today for a chief concern of ADD/ADHD follow-up.     At last visit we increased the dose of Concerta. Since then she has not noticed much difference in symptoms.  She does not feel that she is worsening, but is having minimal to any improvement. She has been feeling that her mind is a little foggy for the last two weeks.    She is not experiencing the "buzzing" feeling that she had prior unless she forgets to take her afternoon dose of Ritalin.       Current Medication(s): Concerta and Ritalin in the afternoon.   Overall [name] is doing well, with room for improvement.   Sleep: sleep is good, no concerns.   Diet and weight: appetite has been good, elliptical for exercise, and did not quit smoking as planned at last visit. She has talked to husband and they are planning on doing it together this month.   Work: okay, still struggling with concentration and task completion.   Tics/SE:  "fogging" in the brain.     OBJECTIVE:  BP 112/70  Pulse 94  Temp 99.1 F (37.3 C)  Wt 109 lb (49.442 kg)  SpO2 98%  CONSTITUTIONAL/GENERAL:  Appearance:  Well developed, appearing stated age and in no acute distress,  Weight appears normal for height, Gait and station:  Normal..  CARDIAC: Auscultation:  Regular rate and rhythym.  S1 and S2 are normal.  No murmurs, gallops or rubs.    A:   314.00 ATTN DEFICIT NONHYPERACT  (primary encounter diagnosis)  Comment: Continue current dose for one more month. Discussed treatment options with patient who would like to try this dose for one more month. I am hesitant to increase dose as it may increase her "fogginess" but want to give this medication a solid trial.   Plan:   1. Continue METHYLPHENIDATE HCL 36 MG OR TBCR and afternoon dose of METHYLPHENIDATE HCL 10 MG OR TABS  2. Follow up in 3-4 weeks. If not improving at that time, consider trial of Adderall or other medication.     Encouraged patient with plan to quit smoking and  praised for addition of exercise into regimen.     Over 50% of today's visit was spent in coordination of care and counseling: discussing the potential diagnoses, the prognoses, and the treatment plan, including future management options if initial plan not fully addressing symptoms. All questions and concerns addressed. Total time spent with patient = 15 min.

## 2009-06-13 ENCOUNTER — Ambulatory Visit (INDEPENDENT_AMBULATORY_CARE_PROVIDER_SITE_OTHER): Payer: PRIVATE HEALTH INSURANCE | Admitting: Nurse Practitioner

## 2009-06-19 ENCOUNTER — Encounter (INDEPENDENT_AMBULATORY_CARE_PROVIDER_SITE_OTHER): Payer: Self-pay | Admitting: Nurse Practitioner

## 2009-06-19 ENCOUNTER — Ambulatory Visit (INDEPENDENT_AMBULATORY_CARE_PROVIDER_SITE_OTHER): Payer: PRIVATE HEALTH INSURANCE | Admitting: Nurse Practitioner

## 2009-06-19 VITALS — BP 108/68 | HR 98 | Temp 98.4°F | Wt 105.0 lb

## 2009-06-19 MED ORDER — AMPHETAMINE-DEXTROAMPHETAMINE 15 MG OR TABS
ORAL_TABLET | ORAL | Status: DC
Start: 2009-06-19 — End: 2009-08-12
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
## 2009-07-10 ENCOUNTER — Ambulatory Visit (INDEPENDENT_AMBULATORY_CARE_PROVIDER_SITE_OTHER): Payer: PRIVATE HEALTH INSURANCE | Admitting: Nurse Practitioner

## 2009-07-15 ENCOUNTER — Encounter (INDEPENDENT_AMBULATORY_CARE_PROVIDER_SITE_OTHER): Payer: Self-pay | Admitting: Nurse Practitioner

## 2009-07-15 ENCOUNTER — Ambulatory Visit (INDEPENDENT_AMBULATORY_CARE_PROVIDER_SITE_OTHER): Payer: PRIVATE HEALTH INSURANCE | Admitting: Nurse Practitioner

## 2009-07-15 VITALS — BP 108/70 | HR 102 | Temp 98.0°F | Wt 104.0 lb

## 2009-07-15 MED ORDER — AMPHETAMINE-DEXTROAMPHETAMINE 20 MG OR TABS
ORAL_TABLET | ORAL | Status: DC
Start: 2009-07-15 — End: 2009-08-12

## 2009-07-15 NOTE — Progress Notes (Signed)
Lisa Flynn is a 34 year old female who comes in today for a chief concern of ADD/ADHD follow-up.     Husband is not home, but is calling every day still. She needs to move out end of April and is hoping to move closer to parents      Current Medication(s): Adderall 15mg  BID and 7.5mg  in the early evening.   Overall Lisa Flynn is doing well. Took about two weeks to get used to it, felt that she has tunnel vision. Taking everything more seriously. Doing her job, feeling able to concentrate. Feeling herself on the medication. Feels that there is room for improvement.   Sleep: Sleep is the same, it is good.   Diet and weight: has been trying to eat high calories and ensure. Used to much on treats before but not as much. Not feeling hungry to eat. Weight is low and she would like to be closer to 125 pounds. Last thyroid test was normal.   Work: going well.   Tics/SE:none.     Review of patient's allergies indicates:  No Known Allergies    OBJECTIVE:  BP 108/70  Pulse 102  Temp 98 F (36.7 C)  Wt 104 lb (47.174 kg)  LMP 07/12/2009  General appearance: healthy, alert, no distress   Lungs: clear to auscultation  Cardiac:  Auscultation:  Regular rate and rhythm.  S1 and S2 are normal.  No murmurs, gallops or rubs.  Neuro: Alert interactive and appropriate for age    A/P:   314.00 ATTN DEFICIT NONHYPERACT  (primary encounter diagnosis)  Comment: will try minor dose increase today.   Plan:   1. AMPHETAMINE-DEXTROAMPHETAMINE 20 MG OR TABS  2. Discussed ways to increase caloric intake such as protein shakes, greek yogurt, nuts. If still having difficulty gaining weight, consider referral to Lana[facility] Crumbly for further dietary assistance.   2. Follow up 1 month, sooner as needed.      Over 50% of today's visit was spent in coordination of care and counseling: discussing the potential diagnoses, the prognoses, and the treatment plan, including future management options if initial plan not fully addressing symptoms. All  questions and concerns addressed. Total time spent with patient = 15 min.

## 2009-08-12 ENCOUNTER — Ambulatory Visit (INDEPENDENT_AMBULATORY_CARE_PROVIDER_SITE_OTHER): Payer: PRIVATE HEALTH INSURANCE | Admitting: Nurse Practitioner

## 2009-08-12 ENCOUNTER — Encounter (INDEPENDENT_AMBULATORY_CARE_PROVIDER_SITE_OTHER): Payer: Self-pay | Admitting: Nurse Practitioner

## 2009-08-12 VITALS — BP 110/64 | HR 95 | Temp 98.3°F | Resp 14 | Wt 102.0 lb

## 2009-08-12 MED ORDER — AMPHETAMINE-DEXTROAMPHETAMINE 20 MG OR TABS
ORAL_TABLET | ORAL | Status: DC
Start: 2009-08-12 — End: 2009-08-12

## 2009-08-12 MED ORDER — AMPHETAMINE-DEXTROAMPHETAMINE 20 MG OR TABS
ORAL_TABLET | ORAL | Status: DC
Start: 2009-10-12 — End: 2009-11-13

## 2009-08-12 MED ORDER — AMPHETAMINE-DEXTROAMPHETAMINE 20 MG OR TABS
ORAL_TABLET | ORAL | Status: DC
Start: 2009-09-12 — End: 2009-08-12

## 2009-08-12 MED ORDER — CYCLOBENZAPRINE HCL 5 MG OR TABS
ORAL_TABLET | ORAL | Status: DC
Start: 2009-08-12 — End: 2009-11-13

## 2009-08-12 NOTE — Progress Notes (Signed)
Lisa Flynn is a 34 year old female who comes in today for a chief concern of ADD/ADHD follow-up and back ache.     Patient is doing well on the medication this month, but has been having a lot of other stress in life. Finally moved out of house into new place with 2 kids. Husband has been trying to contact her which is fine as it regarding the kids. They have moved to [name]. She moved this last weekend and has been very tired and stressed. Was trying to do good with eating but knows that she lost weight in the last two weeks.     Current Medication(s): Adderall 20mg  twice daily and 10mg  each afternoon.   Overall Quilla is doing well, very happy on this dose.   Sleep: No problems.   Diet and weight: Was doing some ensure drinks BID and greek yogurt BID in addition to normal breakfast lunch and dinner. Had difficulty after   School/Work: work is going well. Able to concentrate effectively and get projects done  Tics/SE: None.     2) Also having back pain. Moving this last weekend lifting boxes. Pain is across the lower back. No numbness or tingling in the legs. No symptoms in the arms. Also having some soreness in the neck, along the sides. No specific time that she felt symptoms start while lifting, just noticed the soreness and muscle tension after moving.  Does stretching every morning when she gets up regularly, but has not been doing the home PT exercises. Was taking ibuprofen 600mg  four times per day. Also using icy/hot cream. These help a little bit with the pain, but still having symptoms. It is a dull pain that is achy and feels better with massage. Dose not currently have coverage for PT/MT on insurance. Still has the home exercises from past back injury 2 years ago.     Review of patient's allergies indicates:  No Known Allergies    OBJECTIVE:  BP 110/64  Pulse 95  Temp(Src) 98.3 F (36.8 C) (Oral)  Resp 14  Wt 102 lb (46.267 kg)  General appearance: healthy, alert, no  distress   Lungs: clear to auscultation  Cardiac:  Auscultation:  Regular rate and rhythm.  S1 and S2 are normal.  No murmurs, gallops or rubs.  Neuro: Alert interactive and appropriate for age. Patellar and achilles reflexes 2+ bilaterally,.   MSK: No pain on palpation of the spine. Increased muscle tension of the cervical paraspinal and lumbar paraspinal muscles. Straight leg raise negative. Hip ROM normal. Neck ROM normal.     A/P:   314.00 ATTN DEFICIT NONHYPERACT  (primary encounter diagnosis)  Comment: patient stable with current dose. Will refill for 3 months. Will continue to track weight gain.   Plan:  1. AMPHETAMINE-DEXTROAMPHETAMINE 20 MG OR TABS,         AMPHETAMINE-DEXTROAMPHETAMINE 20 MG OR TABS,         AMPHETAMINE-DEXTROAMPHETAMINE 20 MG OR TABS  2. If patient is continuing to lose weight she will follow up sooner, stressed the importance of eating high calorie foods and smoking cessation in helping with weight gain. Continue ensure drinks and greek yogurt, high calorie dairy.   3. Follow up 3 months, sooner as needed.    724.2 LOW BACK PAIN(LUMBAGO)  Comment: likely muscle type spasm, No symptoms consistent with bulging disc or neuro involvement. Will treat conservatively.   Plan:   1. Patient to use Flexeril at nighttime for symptoms.  Continue with ibuprofen TID with food and ice/heat as needed.   2. Re-start home exercises from PT. If symptoms not improving or worsening, recommended follow up for further evaluation.       Over 50% of today's visit was spent in coordination of care and counseling: discussing the potential diagnoses, the prognoses, and the treatment plan, including future management options if initial plan not fully addressing symptoms. All questions and concerns addressed. Total time spent with patient = 25 min.

## 2009-08-18 ENCOUNTER — Encounter (INDEPENDENT_AMBULATORY_CARE_PROVIDER_SITE_OTHER): Payer: Self-pay | Admitting: Family Medicine

## 2009-08-18 MED ORDER — ACETAMINOPHEN ER 650 MG OR TBCR
EXTENDED_RELEASE_TABLET | ORAL | Status: DC
Start: 2009-08-18 — End: 2019-11-09

## 2009-08-20 NOTE — Telephone Encounter (Signed)
Pharmacist called and states that the tylenol has not reached the pharmacy as of yet.  Please advise.

## 2009-08-20 NOTE — Telephone Encounter (Signed)
Called in tylenol to [place] at [facility].

## 2009-11-11 ENCOUNTER — Ambulatory Visit (INDEPENDENT_AMBULATORY_CARE_PROVIDER_SITE_OTHER): Payer: PRIVATE HEALTH INSURANCE | Admitting: Nurse Practitioner

## 2009-11-13 ENCOUNTER — Ambulatory Visit (INDEPENDENT_AMBULATORY_CARE_PROVIDER_SITE_OTHER): Payer: PRIVATE HEALTH INSURANCE | Admitting: Nurse Practitioner

## 2009-11-13 ENCOUNTER — Encounter (INDEPENDENT_AMBULATORY_CARE_PROVIDER_SITE_OTHER): Payer: Self-pay | Admitting: Nurse Practitioner

## 2009-11-13 VITALS — BP 124/76 | HR 118 | Temp 98.4°F | Wt 109.0 lb

## 2009-11-13 MED ORDER — AMPHETAMINE-DEXTROAMPHETAMINE 10 MG OR TABS
ORAL_TABLET | ORAL | Status: DC
Start: 2009-11-13 — End: 2009-12-08

## 2009-11-13 MED ORDER — IBUPROFEN 200 MG OR TABS
ORAL_TABLET | ORAL | Status: DC
Start: 2009-11-13 — End: 2012-03-15

## 2009-11-13 MED ORDER — AMPHETAMINE-DEXTROAMPHET ER 20 MG OR CP24
EXTENDED_RELEASE_CAPSULE | ORAL | Status: DC
Start: 2009-11-13 — End: 2009-12-08

## 2009-11-13 NOTE — Progress Notes (Signed)
Lisa Flynn is a 34 year old female who comes in today for a chief concern of ADD/ADHD follow-up.     Has been very stressed. Doing work more thoroughly, but boss thinks that she is not doing as good of a job. She is on probation at work for being late. Feels that she is starting to get distracted again on tasks at work and procrastinating more.     Current Medication(s): Adderall 20,20,10 Takes at 6:30am, and noon, and then around 3 pm.   Overall Drake is doing okay, but feeling like she is again adjusting to the medication.   Sleep: no problems.   Diet and weight: weight gain of 7 pounds in the last 3 months, trying to still eat high calorie foods.   School/Work: doing well focusing at work, and concentration is good, but still doing some procrastination.   Tics/SE:None.     Review of patient's allergies indicates:  No Known Allergies    OBJECTIVE:  BP 124/76  Pulse 118  Temp 98.4 F (36.9 C)  Wt 109 lb (49.442 kg)  SpO2 99%  LMP 10/30/2009  General appearance: healthy, alert, no distress   Lungs: clear to auscultation  Cardiac:  Auscultation:  Regular rate and rhythm.  S1 and S2 are normal.  No murmurs, gallops or rubs.  Neuro: Alert interactive and appropriate for age    A/P:   314.00 ATTN DEFICIT NONHYPERACT  (primary encounter diagnosis)  Comment: due to concern for continued coverage, will try Adderall XR with IR in the afternoon.   Plan:   1.   AMPHETAMINE-DEXTROAMPHETAMINE 20 MG OR CP24,         AMPHETAMINE-DEXTROAMPHETAMINE 10 MG OR TABS  2. Follow up 1 month, sooner as needed.    Patient requested OTC Advil     Over 50% of today's visit was spent in coordination of care and counseling: discussing the potential diagnoses, the prognoses, and the treatment plan, including future management options if initial plan not fully addressing symptoms. All questions and concerns addressed. Total time spent with patient = 15 min.

## 2009-11-20 ENCOUNTER — Telehealth (INDEPENDENT_AMBULATORY_CARE_PROVIDER_SITE_OTHER): Payer: Self-pay | Admitting: Nurse Practitioner

## 2009-11-20 NOTE — Telephone Encounter (Addendum)
Called for Prior Auth, waiting for form to be faxed//Amy  Confirmation # [id number]  Pt ID # [id number]

## 2009-11-24 NOTE — Telephone Encounter (Signed)
Have we received this yet?

## 2009-11-24 NOTE — Telephone Encounter (Signed)
Form filled out. Placed in to be faxed box.

## 2009-11-24 NOTE — Telephone Encounter (Signed)
Prior auth faxed

## 2009-11-24 NOTE — Telephone Encounter (Signed)
Form received. Placed on your desk. Please review    Thank you

## 2009-12-02 NOTE — Telephone Encounter (Signed)
Prior auth sent back requesting additional information. Form filled out and placed in the to be faxed box.

## 2009-12-04 NOTE — Telephone Encounter (Signed)
Letter from  [employer]. This letter is to inform you that we are unable to approve your request for coverage of   dextroamphetamine-Amph cap. sr 24h for your patient listed above. The request did not meet the conditions necessary for coverage for the following reason.     Attempts to complete the authorization process have been unsuccessful.   You or your  patient may request a reconsideration of this coverage decision. Please provide additional information in support of your request to [facility] or call [phone].

## 2009-12-04 NOTE — Telephone Encounter (Signed)
The letter that was sent was likely before they received my fax with additional information. Will wait to hear back with new fax that was sent.

## 2009-12-08 ENCOUNTER — Encounter (INDEPENDENT_AMBULATORY_CARE_PROVIDER_SITE_OTHER): Payer: Self-pay | Admitting: Nurse Practitioner

## 2009-12-08 MED ORDER — AMPHETAMINE-DEXTROAMPHETAMINE 20 MG OR TABS
ORAL_TABLET | ORAL | Status: DC
Start: 2009-12-08 — End: 2010-01-08

## 2009-12-08 NOTE — Telephone Encounter (Signed)
Sent eCare message to patient. Please see prior TE dated 8/11 to follow up on prior authorization. I did fill out the extra paperwork. Please call to check the status of prior authorization.

## 2009-12-08 NOTE — Telephone Encounter (Signed)
Patient read message and did not respond. Closing TE.

## 2009-12-11 ENCOUNTER — Ambulatory Visit (INDEPENDENT_AMBULATORY_CARE_PROVIDER_SITE_OTHER): Payer: PRIVATE HEALTH INSURANCE | Admitting: Nurse Practitioner

## 2009-12-29 ENCOUNTER — Encounter (INDEPENDENT_AMBULATORY_CARE_PROVIDER_SITE_OTHER): Payer: Self-pay | Admitting: Family Medicine

## 2009-12-29 ENCOUNTER — Telehealth (INDEPENDENT_AMBULATORY_CARE_PROVIDER_SITE_OTHER): Payer: Self-pay | Admitting: Family Medicine

## 2009-12-29 NOTE — Telephone Encounter (Signed)
Fax from [employer] that medication is still in process due to PA. Call to [facility]. Dextroamp-amphet is not authorized . Per Marcina Millard, requested PA form to be faxed again for reconsideration. Will wait for fax.

## 2009-12-29 NOTE — Telephone Encounter (Signed)
Lm for Pt prior auth still in process.  Routing to Milo to f/u in a few days.

## 2009-12-29 NOTE — Telephone Encounter (Signed)
Noted thank you. Please call patient and let her know of the hold up and that we are trying to get this approved.

## 2009-12-29 NOTE — Telephone Encounter (Signed)
Form received, in Kelsey's inbox.

## 2009-12-30 NOTE — Telephone Encounter (Signed)
Form filled out for a second time with additional information and placed in the to be faxed box.

## 2010-01-01 NOTE — Telephone Encounter (Signed)
lefst msg for pt informing of below.

## 2010-01-01 NOTE — Telephone Encounter (Signed)
Received letter from [employer] that Adderall XR has been approved. Please call patient to let her know. Letter placed in the to be scanned pile to have on record for patient's chart.

## 2010-01-08 ENCOUNTER — Encounter (INDEPENDENT_AMBULATORY_CARE_PROVIDER_SITE_OTHER): Payer: Self-pay | Admitting: Nurse Practitioner

## 2010-01-08 ENCOUNTER — Ambulatory Visit (INDEPENDENT_AMBULATORY_CARE_PROVIDER_SITE_OTHER): Payer: PRIVATE HEALTH INSURANCE | Admitting: Nurse Practitioner

## 2010-01-08 VITALS — BP 100/70 | HR 90 | Temp 98.0°F | Resp 12 | Wt 115.4 lb

## 2010-01-08 MED ORDER — AMPHETAMINE-DEXTROAMPHET ER 20 MG OR CP24
EXTENDED_RELEASE_CAPSULE | ORAL | Status: DC
Start: 2010-01-08 — End: 2010-03-10

## 2010-01-08 MED ORDER — AMPHETAMINE-DEXTROAMPHETAMINE 10 MG OR TABS
ORAL_TABLET | ORAL | Status: DC
Start: 2010-02-07 — End: 2010-03-10

## 2010-01-08 MED ORDER — AMPHETAMINE-DEXTROAMPHETAMINE 10 MG OR TABS
ORAL_TABLET | ORAL | Status: DC
Start: 2010-01-08 — End: 2010-03-10

## 2010-01-08 NOTE — Progress Notes (Signed)
Lisa Flynn is a 34 year old female who comes in today for a chief concern of ADD/ADHD follow-up.     Was not happy with the IR, started feeling the buzzing in her head again and losing focus. Was never able to get the XR prescription as it was not covered. She did use the rx for the 10 mg IR while waiting for the XR to be covered. She thinks that the pharmacy still has the Adderall XR 40 mg rx.     Current Medication(s): Was changed to IR due to coverage. Has been taking 20,20,10 of IR. Before this was most effective on 40 XR and 10 IR in the afternoon.   Overall Lisa Flynn is doing okay.   Sleep: going well   Diet and weight: weight gain since last visit, which is good.   School/Work: a little behind on work.   Tics/SE:none    Review of patient's allergies indicates:  No Known Allergies    OBJECTIVE:  BP 100/70  Pulse 90  Temp(Src) 98 F (36.7 C) (Oral)  Resp 12  Wt 115 lb 6.4 oz (52.345 kg)  LMP 01/06/2010  General appearance: healthy, alert, no distress   Lungs: clear to auscultation  Cardiac:  Auscultation:  Regular rate and rhythm.  S1 and S2 are normal.  No murmurs, gallops or rubs.  Neuro: Alert interactive and appropriate for age    A/P:    ATTN DEFICIT NONHYPERACT  (primary encounter diagnosis)  Comment: will give prescriptions for the next  2 months of Adderall 40 mg XR each morning and Adderall 10 mg IR in the afternoon. She will call if pharmacy does not still have original rx.   Plan:  1.  Amphetamine-Dextroamphetamine (ADDERALL XR, 20MG ,) 20 MG Oral CAPSULE SR 24 HR,         Amphetamine-Dextroamphetamine (ADDERALL, 10MG ,)10 MG Oral Tab,        Amphetamine-Dextroamphetamine (ADDERALL, 10MG ,) 10 MG Oral Tab  2. Follow up 1-2 months, sooner as needed.      Over 50% of today's visit was spent in coordination of care and counseling: discussing the potential diagnoses, the prognoses, and the treatment plan, including future management options if initial plan not fully addressing symptoms.  All questions and concerns addressed. Total time spent with patient = 15 min.

## 2010-03-10 ENCOUNTER — Ambulatory Visit (INDEPENDENT_AMBULATORY_CARE_PROVIDER_SITE_OTHER): Payer: PRIVATE HEALTH INSURANCE | Admitting: Nurse Practitioner

## 2010-03-10 ENCOUNTER — Encounter (INDEPENDENT_AMBULATORY_CARE_PROVIDER_SITE_OTHER): Payer: Self-pay | Admitting: Nurse Practitioner

## 2010-03-10 VITALS — BP 110/80 | HR 78 | Temp 98.0°F | Resp 16 | Wt 118.0 lb

## 2010-03-10 MED ORDER — AMPHETAMINE-DEXTROAMPHET ER 20 MG OR CP24
EXTENDED_RELEASE_CAPSULE | ORAL | Status: DC
Start: 2010-04-09 — End: 2010-05-10

## 2010-03-10 MED ORDER — AMPHETAMINE-DEXTROAMPHETAMINE 10 MG OR TABS
ORAL_TABLET | ORAL | Status: DC
Start: 2010-04-09 — End: 2010-05-10

## 2010-03-10 MED ORDER — AMPHETAMINE-DEXTROAMPHETAMINE 10 MG OR TABS
ORAL_TABLET | ORAL | Status: DC
Start: 2010-03-10 — End: 2010-04-09

## 2010-03-10 MED ORDER — AMPHETAMINE-DEXTROAMPHET ER 20 MG OR CP24
EXTENDED_RELEASE_CAPSULE | ORAL | Status: DC
Start: 2010-03-10 — End: 2010-04-09

## 2010-03-10 MED ORDER — AMPHETAMINE-DEXTROAMPHET ER 20 MG OR CP24
EXTENDED_RELEASE_CAPSULE | ORAL | Status: DC
Start: 2010-05-10 — End: 2010-06-10

## 2010-03-10 MED ORDER — AMPHETAMINE-DEXTROAMPHETAMINE 10 MG OR TABS
ORAL_TABLET | ORAL | Status: DC
Start: 2010-05-10 — End: 2010-06-10

## 2010-03-10 NOTE — Progress Notes (Signed)
Lisa Flynn is a 34 year old female who comes in today for a chief concern of ADD/ADHD follow-up.     Liking that she is back on the back on the XR. Some days works better than others, but overall happy with the dose.     Needs to quit smoking. Mother told her she will not watch her kids if she does not quit. Patient would like to try gum/patches first.     Current Medication(s): Adderall XR 40 mg each morning and 10 mg IR in the afternoon  Overall Lisa Flynn is doing well, stable on this dose.   Sleep: sleeping well.   Diet and weight: weight is up a little which is good.   School/Work: work is going well, patient very successful  on recent project.   Tics/SE: none    Review of patient's allergies indicates:  No Known Allergies    OBJECTIVE:  BP 110/80  Pulse 78  Temp(Src) 98 F (36.7 C) (Oral)  Resp 16  Wt 118 lb (53.524 kg)  LMP 02/22/2010  General appearance: healthy, alert, no distress   Neuro: Alert interactive and appropriate for age    A/P:    ATTN DEFICIT NONHYPERACT  (primary encounter diagnosis)  Comment: patient stable, will refill for 3 months.   Plan:  1.  Amphetamine-Dextroamphetamine (ADDERALL XR, 20MG ,) 20 MG Oral CAPSULE SR 24 HR,         Amphetamine-Dextroamphetamine (ADDERALL, 10MG ,)10 MG Oral Tab,             Amphetamine-Dextroamphetamine (ADDERALL, 10MG ,) 10 MG Oral Tab,         Amphetamine-Dextroamphetamine (ADDERALL XR, 20MG ,) 20 MG Oral CAPSULE SR 24 HR,         Amphetamine-Dextroamphetamine (ADDERALL XR, 20MG ,) 20 MG Oral CAPSULE SR 24 HR,         Amphetamine-Dextroamphetamine (ADDERALL, 10MG ,)10 MG Oral Tab  2. Follow up 3 months, sooner as needed.     TOBACCO USE DISORDER  Plan:   1. Patient education given on Chantix and Wellbutrin. Follow up as needed if patient would like to try.         Over 50% of today's visit was spent in coordination of care and counseling: discussing the potential diagnoses, the prognoses, and the treatment plan, including future management  options if initial plan not fully addressing symptoms. All questions and concerns addressed. Total time spent with patient = 15 min.

## 2010-06-23 ENCOUNTER — Ambulatory Visit (INDEPENDENT_AMBULATORY_CARE_PROVIDER_SITE_OTHER): Payer: PRIVATE HEALTH INSURANCE | Admitting: Nurse Practitioner

## 2010-06-25 ENCOUNTER — Ambulatory Visit (INDEPENDENT_AMBULATORY_CARE_PROVIDER_SITE_OTHER): Payer: PRIVATE HEALTH INSURANCE | Admitting: Nurse Practitioner

## 2010-06-25 ENCOUNTER — Encounter (INDEPENDENT_AMBULATORY_CARE_PROVIDER_SITE_OTHER): Payer: Self-pay | Admitting: Nurse Practitioner

## 2010-06-25 VITALS — BP 102/70 | HR 74 | Temp 97.6°F | Resp 12 | Wt 129.0 lb

## 2010-06-25 MED ORDER — VARENICLINE TARTRATE (STARTER) 0.5 MG X 11 & 1 MG X 42 OR TBPK
ORAL_TABLET | ORAL | Status: DC
Start: 2010-06-25 — End: 2012-03-15

## 2010-06-25 MED ORDER — AMPHETAMINE-DEXTROAMPHETAMINE 10 MG OR TABS
ORAL_TABLET | ORAL | Status: DC
Start: 2010-06-25 — End: 2010-09-24

## 2010-06-25 MED ORDER — AMPHETAMINE-DEXTROAMPHETAMINE 10 MG OR TABS
ORAL_TABLET | ORAL | Status: DC
Start: 2010-07-26 — End: 2010-09-24

## 2010-06-25 MED ORDER — AMPHETAMINE-DEXTROAMPHET ER 20 MG OR CP24
EXTENDED_RELEASE_CAPSULE | ORAL | Status: DC
Start: 2010-08-25 — End: 2010-09-24

## 2010-06-25 MED ORDER — AMPHETAMINE-DEXTROAMPHET ER 20 MG OR CP24
EXTENDED_RELEASE_CAPSULE | ORAL | Status: DC
Start: 2010-06-25 — End: 2010-09-24

## 2010-06-25 MED ORDER — AMPHETAMINE-DEXTROAMPHETAMINE 10 MG OR TABS
ORAL_TABLET | ORAL | Status: DC
Start: 2010-08-25 — End: 2010-09-24

## 2010-06-25 MED ORDER — AMPHETAMINE-DEXTROAMPHET ER 20 MG OR CP24
EXTENDED_RELEASE_CAPSULE | ORAL | Status: DC
Start: 2010-07-26 — End: 2010-09-24

## 2010-06-25 MED ORDER — VARENICLINE TARTRATE 1 MG OR TABS
ORAL_TABLET | ORAL | Status: DC
Start: 2010-06-25 — End: 2010-09-24

## 2010-06-25 NOTE — Progress Notes (Signed)
Lisa Flynn is a 35 year old female who comes in today for a chief concern of ADD/ADHD follow-up.     Patient feeling happy with current dose overall. Would like to stay here. She is in a new relationship which is going well and bringing happiness to her life.     Current Medication(s): Adderall XR 40 mg each morning and Adderall IR 10 mg each afternoon.   Overall Lisa Flynn is doing well, stable on this dose  Sleep: no problems.   Diet and weight: gained a few pounds which is good.   School/Work: work is stressful but good  Tics/SE: none    Still smoking but ready to quit. Was talking to coworker who quit smoking with Chantix. He also recommended smoking support group which he has given her the name for. She denies thoughts of depression or suicide.     Review of patient's allergies indicates:  No Known Allergies    OBJECTIVE:  BP 102/70  Pulse 74  Temp(Src) 97.6 F (36.4 C) (Oral)  Resp 12  Wt 129 lb (58.514 kg)  SpO2 98%  LMP 06/13/2010  General appearance: healthy, alert, no distress   Lungs: clear to auscultation  Cardiac:  Auscultation:  Regular rate and rhythm.  S1 and S2 are normal.  No murmurs, gallops or rubs.  Neuro: Alert interactive and appropriate for age    A/P:    ATTN DEFICIT NONHYPERACT  (primary encounter diagnosis)  Comment: patient is stable on this dose.   Plan:  1. Amphetamine-Dextroamphetamine (ADDERALL XR,  20MG ,) 20 MG Oral CAPSULE SR 24 HR,   Amphetamine-Dextroamphetamine (ADDERALL XR,   20MG ,) 20 MG Oral CAPSULE SR 24 HR,   Amphetamine-Dextroamphetamine (ADDERALL XR,   20MG ,) 20 MG Oral CAPSULE SR 24 HR,   Amphetamine-Dextroamphetamine (ADDERALL, 10MG ,)10 MG Oral Tab, Amphetamine-Dextroamphetamine (ADDERALL, 10MG ,) 10 MG Oral Tab, Amphetamine-Dextroamphetamine (ADDERALL, 10MG ,)10 MG Oral Tab  2. Follow up 6 months, sooner as needed. Patient to call in 3 months to pick up additional 3 prescriptions at front desk. She will follow up sooner if needed.      TOBACCO USE  DISORDER  Comment: discussed smoking cessation and education.   Plan:  1. Varenicline Tartrate (CHANTIX STARTING MONTH PAK) 0.5 MG X 11 & 1 MG X 42 Oral Misc, Varenicline Tartrate (CHANTIX) 1 MG Oral Tab. Discussed medication dosage, usage, goals of therapy, and side effects.   2. Encouraged patient to pursue smoking cessation support group for added assistance in quitting.   3. Follow up as needed.     Encouraged patient to schedule physical exam with pap here or with PCP.     I spent 20 min minutes face to face with the patient with > 50% in counseling regarding treatment options, medication management, rationale for medical decision making and answering questions the patient had.

## 2010-09-23 ENCOUNTER — Encounter (INDEPENDENT_AMBULATORY_CARE_PROVIDER_SITE_OTHER): Payer: Self-pay | Admitting: Family Medicine

## 2010-09-24 ENCOUNTER — Encounter (INDEPENDENT_AMBULATORY_CARE_PROVIDER_SITE_OTHER): Payer: Self-pay | Admitting: Nurse Practitioner

## 2010-09-24 ENCOUNTER — Ambulatory Visit (INDEPENDENT_AMBULATORY_CARE_PROVIDER_SITE_OTHER): Payer: PRIVATE HEALTH INSURANCE | Admitting: Nurse Practitioner

## 2010-09-24 VITALS — BP 100/64 | HR 82 | Temp 98.6°F | Resp 16 | Wt 131.0 lb

## 2010-09-24 MED ORDER — AMPHETAMINE-DEXTROAMPHETAMINE 10 MG OR TABS
ORAL_TABLET | ORAL | Status: DC
Start: 2010-09-24 — End: 2010-10-20

## 2010-09-24 MED ORDER — AMPHETAMINE-DEXTROAMPHET ER 25 MG OR CP24
EXTENDED_RELEASE_CAPSULE | ORAL | Status: DC
Start: 2010-09-24 — End: 2010-10-20

## 2010-09-24 MED ORDER — AMPHETAMINE-DEXTROAMPHET ER 25 MG OR CP24
EXTENDED_RELEASE_CAPSULE | ORAL | Status: DC
Start: 2010-09-24 — End: 2010-09-24

## 2010-09-24 MED ORDER — VARENICLINE TARTRATE 1 MG OR TABS
ORAL_TABLET | ORAL | Status: DC
Start: 2010-09-24 — End: 2011-01-04

## 2010-09-24 NOTE — Progress Notes (Signed)
Lisa Flynn is a 35 year old female who comes in today for a chief concern of ADD/ADHD follow-up.     Medication seems to be up and down. Focus is down at times and has noticed decrease concentration. This will happen for the entire day usually, and will notice more of the "buzzing" feeling in her head when medication is not working as well. On some days she will take her afternoon dose sooner.     Current Medication(s): Adderall XR 40 mg each morning and Adderall IR 10 mg each afternoon.   Overall Akiva is doing well, room for improvement.   Sleep: going well  Diet and weight: stable, weight has increased since quitting smoking; still at healthy weight  School/Work:  Going well, busy  Tics/SE: none.     Did quit smoking recently. Still having urges but doing better overall.     Review of patient's allergies indicates:  No Known Allergies    OBJECTIVE:  BP 100/64  Pulse 82  Temp(Src) 98.6 F (37 C) (Temporal)  Resp 16  Wt 131 lb (59.421 kg)  SpO2 100%  General appearance: healthy, alert, no distress   Lungs: clear to auscultation  Cardiac:  Auscultation:  Regular rate and rhythm.  S1 and S2 are normal.  No murmurs, gallops or rubs.  Neuro: Alert interactive and appropriate for age    A/P:   314.00 ATTN DEFICIT NONHYPERACT  (primary encounter diagnosis)  Comment: doing well overall, some room for improvement, will go up on the XR dose to 50 mg total each morning   Plan:   1. Amphetamine-Dextroamphetamine (ADDERALL, 10MG ,)10 MG Oral Tab  Amphetamine-Dextroamphetamine (ADDERALL XR, 25MG ,) 25 MG Oral CAPSULE SR 24 HR  2. Follow up 1 month, sooner as needed.    305.1 TOBACCO USE DISORDER  Comment: doing well, continue for at least one more month  Plan:   1. Varenicline Tartrate (CHANTIX) 1 MG Oral Tab  2.  Follow up 1 month    I spent 15 minutes face to face with the patient with > 50% in counseling regarding HPI, differential diagnosis, treatment options, medication management, rationale for  medical decision making and answering questions the patient had.

## 2010-09-25 ENCOUNTER — Telehealth (INDEPENDENT_AMBULATORY_CARE_PROVIDER_SITE_OTHER): Payer: Self-pay | Admitting: Family Medicine

## 2010-09-25 NOTE — Telephone Encounter (Signed)
CONFIRMED PHONE NUMBER: [phone]  CALLERS FIRST AND LAST NAME: Lisa Flynn  FACILITY NAME: / TITLE: /  CALLERS RELATIONSHIP:Self  RETURN CALL: Detailed message on voicemail only    SUBJECT: General Message   REASON FOR REQUEST: Prior Auth    MESSAGE: This patient's Adderall will not be covered by insurance without authorization from a doctor beforehand. She was hoping someone could call today and give this information [phone]. She would like to be called if there are any issues that arise from this.   FORWARD TO: Marcina Millard

## 2010-09-25 NOTE — Telephone Encounter (Signed)
routing to [facility] for prior authorization

## 2010-09-26 NOTE — Telephone Encounter (Signed)
Will contact ins on Monday.

## 2010-09-28 NOTE — Telephone Encounter (Signed)
Called number below , route to new number [phone]. Placed on hold for 10 min, could not hold anymore.  Will try later.

## 2010-09-28 NOTE — Telephone Encounter (Signed)
called pt lvm informed per below.  Called ins talked to [facility] , he said pt had already prior auth in her records, kevin change the dosage to new and told me pt has approval through 12/2010.

## 2010-09-28 NOTE — Telephone Encounter (Signed)
Called ins at [phone] on hold, after awhile, talked to [facility] , was transfer to another number.  Talked to kevin at [facility], he placed me on hold , could not wait any more after 20 min.  Will call later.

## 2010-10-20 ENCOUNTER — Encounter (INDEPENDENT_AMBULATORY_CARE_PROVIDER_SITE_OTHER): Payer: Self-pay | Admitting: Nurse Practitioner

## 2010-10-20 ENCOUNTER — Ambulatory Visit (INDEPENDENT_AMBULATORY_CARE_PROVIDER_SITE_OTHER): Payer: PRIVATE HEALTH INSURANCE | Admitting: Nurse Practitioner

## 2010-10-20 VITALS — BP 120/80 | HR 78 | Temp 97.7°F | Resp 14 | Wt 131.0 lb

## 2010-10-20 MED ORDER — AMPHETAMINE-DEXTROAMPHET ER 25 MG OR CP24
EXTENDED_RELEASE_CAPSULE | ORAL | Status: DC
Start: 2010-10-24 — End: 2011-01-04

## 2010-10-20 MED ORDER — AMPHETAMINE-DEXTROAMPHETAMINE 10 MG OR TABS
ORAL_TABLET | ORAL | Status: DC
Start: 2010-10-20 — End: 2011-01-04

## 2010-10-20 MED ORDER — AMPHETAMINE-DEXTROAMPHET ER 25 MG OR CP24
EXTENDED_RELEASE_CAPSULE | ORAL | Status: DC
Start: 2010-11-24 — End: 2011-01-04

## 2010-10-20 NOTE — Progress Notes (Signed)
Lisa Flynn is a 35 year old female who comes in today for a chief concern of ADD/ADHD follow-up.     Has not noticed much change since going up on the dose. This month has been especially stressful so that may be the cause. Would prefer not to increase the dose if possible.     Current Medication(s): Adderall XR 50 mg each morning and 10 mg IR each afternoon  Overall Lisa Flynn is doing okay, still some room for improvement, however at maximum dose of Adderall  Sleep:  No problems  Diet and weight: weight stable  School/Work: life has been very busy  Tics/SE: none    Has noticed pain with urination a few days after starting the new dose of Adderall. Had antibiotics, Cipro from last infection. Will take for a few days, then symptoms will improve. Unsure if infection is still present. Last dose was at lunch today.     Review of patient's allergies indicates:  No Known Allergies    OBJECTIVE:  BP 120/80  Pulse 78  Temp(Src) 97.7 F (36.5 C) (Oral)  Resp 14  Wt 131 lb (59.421 kg)  SpO2 100%  LMP 10/20/2010  General appearance: healthy, alert, no distress   Lungs: clear to auscultation  Cardiac:  Auscultation:  Regular rate and rhythm.  S1 and S2 are normal.  No murmurs, gallops or rubs.  Neuro: Alert interactive and appropriate for age  Back: no CVA tenderness    A/P:   314.00 ATTN DEFICIT NONHYPERACT  (primary encounter diagnosis)  Comment: will stay with this dose for a few more months to see if any continued improvement.   Plan:  1.  Amphetamine-Dextroamphetamine (ADDERALL XR, 25MG ,) 25 MG Oral CAPSULE SR 24 HR,   Amphetamine-Dextroamphetamine (ADDERALL, 10MG ,)10 MG Oral Tab, Amphetamine-Dextroamphetamine (ADDERALL XR, 25MG ,) 25 MG Oral CAPSULE SR 24 HR  2. Follow up 2 months, sooner as needed. If still not improving, will likely need to try different medications.       788.1 DYSURIA  Comment: likely urine will be normal due to antibiotics, however will check sample to confirm.   Plan:  1. U/A  AUTO DIPSTICK ONLY, URINE C&S- patient unable to leave sample, will future order for her to leave at [facility] clinic.   2. Follow up if fever, flank pain or worsening symptoms.         I spent 20 minutes face to face with the patient with > 50% in counseling regarding HPI, differential diagnosis, treatment options, medication management, rationale for medical decision making and answering questions the patient had.

## 2011-01-04 ENCOUNTER — Encounter (INDEPENDENT_AMBULATORY_CARE_PROVIDER_SITE_OTHER): Payer: Self-pay | Admitting: Nurse Practitioner

## 2011-01-04 ENCOUNTER — Ambulatory Visit (INDEPENDENT_AMBULATORY_CARE_PROVIDER_SITE_OTHER): Payer: PRIVATE HEALTH INSURANCE | Admitting: Nurse Practitioner

## 2011-01-04 VITALS — BP 115/72 | HR 90 | Temp 99.1°F | Resp 14 | Wt 131.0 lb

## 2011-01-04 MED ORDER — LISDEXAMFETAMINE DIMESYLATE 40 MG OR CAPS
ORAL_CAPSULE | ORAL | Status: DC
Start: 2011-01-04 — End: 2011-02-01

## 2011-01-04 MED ORDER — VARENICLINE TARTRATE 1 MG OR TABS
ORAL_TABLET | ORAL | Status: DC
Start: 2011-01-04 — End: 2011-05-11

## 2011-01-04 NOTE — Patient Instructions (Signed)
Will try Vyvanse which is a similar medication to Adderall. It is longer acting.

## 2011-01-04 NOTE — Progress Notes (Signed)
Lisa Flynn is a 35 year old female who comes in today for a chief concern of ADD/ADHD follow-up.     At last visit, patient felt that this dose increase was not working quite as well, however wanted to try and continue for a couple more months. Currently, she reports that the extended release is not working as well. After starting work she will get side tracked a lot. Wondering what other options she has.     Stopped Chantix for 3 weeks, and no improvement in the Adderall, so started back on the Chantix.  Would like a refill.     Current Medication(s): Adderall XR 50 mg and Adderall IR 10 mg each afternoon. Stopped using the IR.   Overall Lisa Flynn is doing okay, room for improvement  Sleep: no problems  Diet and weight: weight is stable.   School/Work: work has been busy and concentration has suffered  Tics/SE: none     Would like booster of Tdap, thinks it has been more than 10 years.     Review of patient's allergies indicates:  No Known Allergies    OBJECTIVE:  BP 115/72  Pulse 90  Temp(Src) 99.1 F (37.3 C) (Temporal)  Resp 14  Wt 131 lb (59.421 kg)  SpO2 100%  LMP 12/12/2010  General appearance: healthy, alert, no distress   Lungs: clear to auscultation  Cardiac:  Auscultation:  Regular rate and rhythm.  S1 and S2 are normal.  No murmurs, gallops or rubs.  Neuro: Alert interactive and appropriate for age    A/P:   314.00 ATTN DEFICIT NONHYPERACT  (primary encounter diagnosis)  Comment: discussed options as patient has used Ritalin IR, Concerta, Adderall IR and XR and has seemed to adjust to medication over time. Will try changing to Vyvanse.   Plan:   1. Lisdexamfetamine Dimesylate (VYVANSE) 40 MG Oral Cap  2. Follow up 1 month, sooner as needed.    305.1 Tobacco use disorder  Plan: Varenicline Tartrate (CHANTIX) 1 MG Oral Tab    V06.1 Need for prophylactic vaccination with combined diphtheria-tetanus-pertussis (DTP) vaccine  Plan: TDAP VACC, >7 YRS, 0.5 ML/IM    I spent 15 minutes face to  face with the patient with > 50% in counseling regarding HPI, differential diagnosis, treatment options, medication management, rationale for medical decision making and answering questions the patient had.

## 2011-01-04 NOTE — Progress Notes (Signed)
Patient/Parent has reviewed the appropriate VIS and has all questions answered? YES    Vaccine given today without initial adverse effect. YES    Doki D Medical Assistant  [facility]

## 2011-01-19 ENCOUNTER — Other Ambulatory Visit: Payer: Self-pay

## 2011-01-25 ENCOUNTER — Encounter (INDEPENDENT_AMBULATORY_CARE_PROVIDER_SITE_OTHER): Payer: Self-pay | Admitting: Family Medicine

## 2011-01-25 ENCOUNTER — Other Ambulatory Visit (HOSPITAL_BASED_OUTPATIENT_CLINIC_OR_DEPARTMENT_OTHER)
Admit: 2011-01-25 | Discharge: 2011-01-25 | Disposition: A | Discharge status: Home / Self Care | Payer: PRIVATE HEALTH INSURANCE | Attending: Family Medicine | Admitting: Family Medicine

## 2011-01-25 ENCOUNTER — Ambulatory Visit (INDEPENDENT_AMBULATORY_CARE_PROVIDER_SITE_OTHER): Payer: PRIVATE HEALTH INSURANCE | Admitting: Family Medicine

## 2011-01-25 VITALS — BP 110/68 | HR 60 | Temp 97.8°F | Resp 12 | Wt 130.0 lb

## 2011-01-25 DIAGNOSIS — Z01419 Encounter for gynecological examination (general) (routine) without abnormal findings: Secondary | ICD-10-CM | POA: Insufficient documentation

## 2011-01-25 DIAGNOSIS — Z1151 Encounter for screening for human papillomavirus (HPV): Secondary | ICD-10-CM | POA: Insufficient documentation

## 2011-01-25 MED ORDER — DME PRESCRIPTION
Status: DC
Start: 2011-01-25 — End: 2011-04-25

## 2011-01-25 NOTE — Progress Notes (Signed)
Lisa Flynn is a 35 year old female here today for a preventive health visit.   Other problems or concerns today:   1.  Requesting new wig for her alopecia.    Lipids have been checked at her work and have been acceptable.     GYN HISTORY  Obstetric History    G1   P1   T1   P0   A0   TAB0   SAB0   E0   M0   L1      Patient's last menstrual period was 12/12/2010.   Periods are regular q 28-30 days, lasting 4 days.    Dysmenorrhea: none  Cyclic symptoms: none  Intermenstrual bleeding, spotting,or discharge: No   Last pap: 2009  Pap history: no history of abnormal paps  Contraception: yes:  IUD:  PARAGARD T380A  Other gyn history: none    SEXUAL HISTORY  Sexual activity: yes, single partner, contraception - IUD  Age at first intercourse: Not asked today  History of STD: none known  Number of sex partners in the past year: 1  Last STD check: not asked    Chlamydia screening offered: YES  HIV screening offered: YES  HPV vaccine offered: No  Sexual concerns: No  History of sexual or physical abuse: Not asked today  Have you been hit, kicked, punched, or otherwise hurt by someone within the past year:Not asked today    CANCER SCREENING  Family history of colon cancer: NO  Family history of uterine or ovarian cancer: NO  Family history of breast cancer: NO  Prior mammogram: NO  History of abnormal mammogram: NO    LIFESTYLE  Current dietary habits: healthy diet in general  Calcium: dietary sources only  Current exercise habits: non-sedentary, but no regularly scheduled exercise  Regular seat belt use: YES  Substance use:  reports that she has quit smoking. Her smoking use included Cigarettes. She quit after 15 years of use. She does not have any smokeless tobacco history on file. She reports that she does not drink alcohol.  Stress: young child  Exposure to hazardous materials: Not asked today  Guns in the house: Not asked today    History sections of chart reviewed and updated today: YES    Review Of  Systems  Weight: stable  Constitutional: denies fatigue, fever, chills, sweats  Regular dental exams: yes  GI: Denies constipation, diarrhea, or blood in stools; denies nausea, dysphagia, heartburn or other abd. pain  GU:  Vaginal bump x 1 month. denies abnormal vaginal bleeding, discharge or unusual pelvic pain, denies dysuria, frequency or gross hematuria  Endocrine: no known diabetes or thyroid disease   Skin: denies rash or other active skin concerns  Psych: Depression symptoms: none.  Other: none    EXAM:  BP 110/68  Pulse 60  Temp(Src) 97.8 F (36.6 C) (Temporal)  Resp 12  Wt 130 lb (58.968 kg)  LMP 12/12/2010  There is no height on file to calculate BMI.  General appearance: healthy, alert, no distress  Skin: alopecia including approx 50% of scalp.  Skin color, texture, turgor normal. No rashes or concerning lesions  Neck: supple. No adenopathy. Thyroid symmetric, normal size, without nodules  Breasts: approx 1 cm firm mass at 3 oclock right breast- pt feels stable from previous mammogram several years ago.  No obvious deformity or mass to inspection, nipples everted bilaterally, no skin lesion or nipple discharge, no mass palpated, no axillary lymphadenopathy, fibrocystic change without dominant masses  Lungs: clear to auscultation and no cough nor wheeze with forced inspiration/expiration  Heart: exam deferred  Abdomen: soft, non-tender. BS normal. No masses or organomegaly  Pelvic: normal bartholin/skene/urethral meatus/anus., Vagina is rugated and well-estrogenized, cervix normal in appearance, no CMT, no bladder tenderness, uterus normal size, shape, and consistency, no adnexal masses or tenderness, positive findings: 4 discrete 2mm warts right lower vulvar area on labia minora   Rectal: deferred    ASSESSMENT/PLAN:  Health Maintenance   Topic Date Due   . Influenza Vaccine  01/11/2011   . Pap 3 Year W/ib Message  02/19/2011   . Tetanus Booster  01/03/2021     Immunizations or studies due: Yes, std  screening as below.  Diagnostic mammogram due to breast lump.      V72.31 Routine gynecological examination  (primary encounter diagnosis): pap obtained today.  Encouraged regular exercise and healthy diet, STD protection.    Plan: PAP SMEAR (CYTOPATH, GYN)-[facility]    704.01 Alopecia areata  Plan: DME PRESCRIPTION    305.1 Tobacco use disorder:  Encouraged her to continue cessation.  F/u prn.      V74.5 Screening examination for venereal disease: encouraged use of condoms.   Plan: HIV ANTIGEN AND ANTIBODY SCRN, GC&CHLAM NUCLEIC        ACID DETECTN    078.19 Other specified viral warts:  New dx today.  The treatments, side effects and failure rates are discussed.  Liquid nitrogen was applied to each wart.  The expected skin reaction including erythema, pain, scabbing, blistering and hypopigmented scar formation was discussed.  See at intervals until warts resolved.  Handout given.  F/u prn.    Plan: DESTRUC,VULVA LESION,SIMPLE    611.72 Lump or mass in breast  Plan: MAMMOGRAM, BOTH BREASTS DIAGNOSTIC        Preventive counseling: STD prevention  breast self-exam  immunizations  dental care  moderation in EtOH use/no drinking and driving  cessation of tobacco use  low-fat high-fiber diet  adequate calcium intake  vitamin D supplementation  proper exercise  Follow-up: 1 year  Lab tests and results of any diagnostic studies will be shared with the patient by  [facility]

## 2011-01-25 NOTE — Patient Instructions (Addendum)
Leading a Healthy Life  Six tips to help improve your health and wellness     This explains how these 6 basic guidelines may improve your health and wellness:     Eat well to give your body the energy it needs.     Stay or get active.     A healthy mind is part of a healthy body.     Practice safe living habits.     Keep your mind and body free of harmful drugs and alcohol.     Get regular health care.     Tip #1: Eat well to give your body the energy it needs.   Your body needs nutritious foods to stay strong and healthy.   Here are some general eating guidelines:     Have 2 servings of fish or other seafood 2 times a week (1 serving = 4 ounces).     If you eat dairy products, choose low-fat (1%) or nonfat ones.     If you eat meat, cut down on the amount. Replace it with plant-based foods such as beans, whole grains, fruits and vegetables, and nuts and seeds.     Have less than 1,500 mg of sodium (salt) a day.     Cut down on "junk food" like alcohol, fatty foods, chips, candy, and other sweets.     Tip #2: Stay or get active.   Exercise for at least 30 minutes at a time, 3 times a week. Regular physical activity can help you:     Live longer and feel better     Be stronger and more flexible     Build strong bones     Prevent depression     Strengthen your immune system     Maintain a healthy body weight     Tip #3: Remember: A healthy mind is part of a healthy body.   A good state of mind can help you make healthy choices. Here are a few tips for keeping your mind healthy:     Reduce stress in your life.     Make some time every day for things that are fun.     Get enough sleep. Lack of sleep reduces how well you can concentrate, increases mood swings, and raises your risk of having a car accident.     Ask your health care provider for help if you feel depressed or anxious for more than several days in a row.     Tip #4: Practice safe living habits.   Accidents and Injuries        Accidents and injuries are the  5th leading cause of death in the [place]     Women under age 35 are more likely to die in motor vehicle accidents than from any other cause.     Accidents in the home cause thousands of permanent injuries every year.     The most common accidents are fires, falls, and drowning. To help yourself and your family stay safe:     Install smoke detectors on each floor of your home.     Make sure everyone in your family knows how to swim    Stay safe on the road:  o Wear a seatbelt.   o Do not ride with someone who has been drinking or taking drugs.   o Do not speak on a cell phone or send, read, or write text messages while you are driving.   o Wear a   helmet when you ride a bicycle or motorcycle.   o Get enough sleep at night, and do not drive when you are tired.     Hand Hygiene   Protect yourself from germs by washing your hands often. Always wash your hands:     After you change a diaper or use the toilet     Before you start and after you finish preparing food     Tip #5: Keep your mind and body free of harmful drugs and alcohol.   Tobacco causes more health problems than any other substance. These problems include lung disease, heart disease, and many types of cancer. The nicotine in tobacco is the most addictive and widely used drug.   Too much alcohol can cause damage to your liver, heart, brain, bones, and other body tissues. Being under the influence of alcohol also increases your chance of being injured in an accident.    Alcohol can cause fetal alcohol syndrome in your children if you drink regularly when you are pregnant.   Street drugs, like marijuana, cocaine, methamphetamine, heroin, or pain pills not prescribed by your doctor can harm your health. They may be mixed with harmful substances, and using them can cause people to put themselves in dangerous situations.     Tip #6: Get regular health care.   Many people think they need to see the doctor only when they are sick. But, health care providers can also  help you stay healthy.     Find a health care provider who works with you to manage your health.     Ask your health care provider what diseases you are at risk for. Learn what you can do to prevent or control them.     Get yourself and your family immunized against life-threatening diseases.   Recommendations for Pap Smear screening age 41-65    If you have had three normal Pap test results in a row and no new partners, pap smear every 3 years OR pap smear and HPV screening every 5 years  o Exceptions:     if you have ever been treated for moderate or severe dysplasia of the cervix and have completed post-treatment surveillance, you can return to routine screening for your age group, and continue Pap screening for at least twenty years.    If you are HIV positive you should have a pap smear every six months for a year after diagnosis and then every year.    If you have a suppressed immune system you should have a pap yearly    Discontinue screening at age 48 if there have been three consecutive normal Pap smears and no abnormal in the past ten years with the most recent testing in the last 5 years    If you are sexually active, you should have a yearly screening for Chlamydia, HIV, and other sexually transmitted infections as appropriate    If you are using contraception, you should see your provider yearly for symptom review, blood pressure check (if using a hormonal method), and exam if needed.    -------------------  Genital Warts   What are genital warts?   Genital warts are similar to common warts but are usually found around or in the vagina, cervix (the lower part of the uterus), penis, scrotum, rectum, or the area between the vagina and rectum. They are soft, fleshy, small growths on the skin.   How do they occur?   Like other warts, genital warts are caused  by a virus. The virus that causes genital warts is called human papillomavirus (HPV). There are many types of HPV. The types of virus that most often  cause genital warts are called HPV-6 and HPV-11.   Genital warts are more contagious, or more easily spread, than other warts. They are spread by skin-to-skin contact. They may spread to other nearby parts of the body and they may be passed from person to person by sexual activity. The warts are usually first seen 1 to 6 months after you have been infected with HPV. However, you can be infected with HPV without having any visible warts.   HPV can cause changes in a woman's cervix. Most of the time these changes are harmless, but sometimes the changes may cause cervical cancer.   What are the symptoms?   Genital warts are small, flesh-colored, grayish white or pinkish white growths. You may have many warts or just 1 wart. The warts usually appear as thin, flexible, solid bumps on the skin that look like small pieces of cauliflower. Some warts, however, are quite small and flat and may not be easily noticed.   In women, warts can grow in the vulva (the folds of skin around the opening of the vagina), on the cervix, inside the vagina or urethra, or around the anus. In men, warts can grow on the tip or shaft of the penis and sometimes on the scrotum, in the urethra (the tube that carries urine out of the body), or around the anus.   You may have no other symptoms or you may also have:   a bad smell, mild irritation, burning. itching, or pain in the vulva or vagina   pain with intercourse   increased vaginal discharge   bleeding (from injury to warts during sex)   When genital warts are on the cervix or in the vagina, they may not cause any noticeable symptoms. However, a Pap test may show changes in the cells that suggest a viral infection, or your healthcare provider may see them during the exam.   How are they diagnosed?   Your healthcare provider will examine your genital area and the warts. Your provider may put a liquid on the skin to make it easier to see warts. An instrument called a colposcope may be used to  magnify the area so your provider can look more closely at the skin or the cervix. A sample of tissue may be taken for lab tests to help confirm the diagnosis. A scope may be used to check for warts in the bladder and the urethra.   Often warts that cannot be seen are diagnosed when women have a Pap test. Sometimes an HPV-DNA test may be done to see if the type of HPV causing the warts is the type associated with cervical cancer.   How are they treated?   The main methods of treatment are:   putting medicine on the warts   surgically removing the warts   freezing the warts with liquid nitrogen (cryotherapy)   destroying the warts with a laser   burning off the warts using a wire loop and electric current (electrocautery)   waiting to see if the warts go away on their own   You may need a local anesthetic to numb the area before some of these treatments.   If you have genital warts and plan to get pregnant, get treatment for the warts before you get pregnant. If you get genital warts while you  are pregnant, it is rare for the HPV to affect the baby. However, warts tend to grow and you may get more of them during the pregnancy. Usually the warts are not treated until after you deliver your baby. A cesarean delivery (C-section) will not have to be done to prevent spread to the baby. You may need to have a C-section, however, if your healthcare provider thinks the warts are so big that a vaginal delivery may cause too much bleeding. Rarely does the baby develop warts after the delivery.   How long will the effects last?   Sometimes the warts may go away without treatment. They may, however, grow and form larger cauliflower-like clusters of warts.   Removal of the warts does not get rid of the virus. Because you will still have the virus after treatment, other warts can grow.   Certain types of HPV infection of the cervix can lead, in time, to cervical cancer in women. The HPV-6 and HPV-11 types of virus, which are the  usual cause of genital warts, rarely lead to cancer and are called low-risk HPVs. High-risk types of HPVs cause growths that are usually flat and nearly invisible, as compared with the warts caused by types HPV-6 and HPV-11.   How can I help take care of myself?   Keep the genital area clean and dry. You can use a hair dryer to help dry the area.   Wash your hands thoroughly after touching the area with warts.   Do not scratch the warts.   Get follow-up exams according to your healthcare provider's recommendations.   Women should have a Pap test as often as their healthcare provider recommends.   If you have genital warts and plan to get pregnant, have your warts checked by your provider.   How can I help prevent the spread of genital warts?   The best way to prevent the spread of HPV is by not having sex.   A vaccine called Gardasil is available to prevent types of HPV infections that are high risk for genital warts and cancer of the cervix. If you already have HPV, the shot will not cure your infection, but it will prevent infections with several other types of HPV.   The Gardasil shot is approved for girls and women 23 to 35 years old. It is recommended for all girls 73 to 35 years old as part of their routine immunization schedule. It is given in 3 doses within a period of 6 months. It is not sure how long Gardasil will protect you. It may protect you from HPV for 5 years. Researchers are doing studies to see if a booster shot is needed after 5 years.   Gardasil is usually not given to pregnant women.   Here are some other things you can do to help prevent HPV or its complications:   Women: Get an exam and Pap test every year.   Do not have sexual intercourse until you are married or over the age of 74.   Use latex or polyurethane condoms during sex. Even after your warts are gone, you can infect your partner because the virus is still in your body. Condoms can reduce the risk of getting genital warts from  another person, but HPV can spread from areas not covered by a condom.   Have just one sexual partner who is not sexually active with anyone else.   Avoid sexual contact until the genital warts or HPV is completely  treated and healed.   Do not smoke. Studies show that smoking increases the risks and problems related to HPV infection.

## 2011-01-26 LAB — GC&CHLAM NUCLEIC ACID DETECTN
Chlam Trachomatis Nucleic Acid: NEGATIVE
N.Gonorrhoeae(GC) Nucleic Acid: NEGATIVE

## 2011-01-26 LAB — HIV ANTIGEN AND ANTIBODY SCRN
HIV Antigen and Antibody Interpretation: NONREACTIVE
HIV Antigen and Antibody Result: NONREACTIVE

## 2011-01-26 NOTE — Progress Notes (Signed)
Addended by: Theresa Duty on: 01/26/2011 10:34 AM     Modules accepted: Orders

## 2011-01-28 LAB — CERVICAL CANCER SCREENING: Cytologic Impression: NEGATIVE

## 2011-01-28 NOTE — Progress Notes (Signed)
Addended by: Dicie Beam C on: 01/28/2011 06:05 PM     Modules accepted: Level of Service

## 2011-01-29 ENCOUNTER — Encounter (INDEPENDENT_AMBULATORY_CARE_PROVIDER_SITE_OTHER): Payer: Self-pay | Admitting: Family Medicine

## 2011-01-29 LAB — HPV ONLY

## 2011-02-01 ENCOUNTER — Encounter (INDEPENDENT_AMBULATORY_CARE_PROVIDER_SITE_OTHER): Payer: Self-pay | Admitting: Nurse Practitioner

## 2011-02-01 ENCOUNTER — Ambulatory Visit (INDEPENDENT_AMBULATORY_CARE_PROVIDER_SITE_OTHER): Payer: PRIVATE HEALTH INSURANCE | Admitting: Nurse Practitioner

## 2011-02-01 VITALS — BP 106/74 | HR 80 | Temp 97.9°F | Resp 16 | Ht 64.76 in | Wt 129.0 lb

## 2011-02-01 MED ORDER — LISDEXAMFETAMINE DIMESYLATE 40 MG OR CAPS
ORAL_CAPSULE | ORAL | Status: DC
Start: 2011-03-04 — End: 2011-05-11

## 2011-02-01 MED ORDER — LISDEXAMFETAMINE DIMESYLATE 40 MG OR CAPS
ORAL_CAPSULE | ORAL | Status: DC
Start: 2011-02-01 — End: 2011-05-11

## 2011-02-01 MED ORDER — LISDEXAMFETAMINE DIMESYLATE 40 MG OR CAPS
ORAL_CAPSULE | ORAL | Status: DC
Start: 2011-04-03 — End: 2011-05-11

## 2011-02-01 NOTE — Progress Notes (Signed)
Lisa Flynn is a 35 year old female who comes in today for a chief concern of ADD/ADHD follow-up.     Feels like the medication is working fairly well. Out of her system around 4pm. Has had a stressful month at work and she told them she was going to quit if they did not treat her fairly. She does not feel like she is being paid and promoted as she should when she is doing good work. Feels like her concentration at work is good.     Current Medication(s): Vyvanse 40 mg   Overall Lisa Flynn is doing well, happy with this dose for now  Sleep: no problems  Diet and weight: no problems.   School/Work: did have a clear head at work.   Tics/SE: none    Review of patient's allergies indicates:  No Known Allergies    OBJECTIVE:  BP 106/74  Pulse 80  Temp(Src) 97.9 F (36.6 C) (Oral)  Resp 16  Ht 5' 4.76" (1.645 m)  Wt 129 lb (58.514 kg)  BMI 21.62 kg/m2  SpO2 99%  LMP 12/12/2010  General appearance: healthy, alert, no distress   Lungs: clear to auscultation  Cardiac:  Auscultation:  Regular rate and rhythm.  S1 and S2 are normal.  No murmurs, gallops or rubs.  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   Plan:   1. Lisdexamfetamine Dimesylate (VYVANSE) 40 MG Oral Cap,   Lisdexamfetamine Dimesylate (VYVANSE) 40 MG Oral Cap  Lisdexamfetamine Dimesylate (VYVANSE) 40 MG Oral Cap  2. Follow up 3 months, sooner as needed.      I spent  15 minutes face to face with the patient with > 50% in counseling regarding HPI, differential diagnosis, treatment options, medication management, rationale for medical decision making and answering questions the patient had.

## 2011-02-01 NOTE — Patient Instructions (Signed)
Follow up 3 months, sooner as needed.

## 2011-05-11 ENCOUNTER — Encounter (INDEPENDENT_AMBULATORY_CARE_PROVIDER_SITE_OTHER): Payer: Self-pay | Admitting: Nurse Practitioner

## 2011-05-11 ENCOUNTER — Ambulatory Visit (INDEPENDENT_AMBULATORY_CARE_PROVIDER_SITE_OTHER): Payer: PRIVATE HEALTH INSURANCE | Admitting: Nurse Practitioner

## 2011-05-11 VITALS — BP 120/70 | HR 118 | Temp 98.4°F | Resp 14 | Wt 138.0 lb

## 2011-05-11 MED ORDER — LISDEXAMFETAMINE DIMESYLATE 40 MG OR CAPS
ORAL_CAPSULE | ORAL | Status: DC
Start: 2011-05-11 — End: 2011-08-05

## 2011-05-11 MED ORDER — LISDEXAMFETAMINE DIMESYLATE 40 MG OR CAPS
ORAL_CAPSULE | ORAL | Status: DC
Start: 2011-06-10 — End: 2011-08-05

## 2011-05-11 MED ORDER — VARENICLINE TARTRATE 1 MG OR TABS
ORAL_TABLET | ORAL | Status: DC
Start: 2011-05-11 — End: 2011-11-11

## 2011-05-11 MED ORDER — DME PRESCRIPTION
Status: AC
Start: 2011-05-11 — End: ?

## 2011-05-11 MED ORDER — LISDEXAMFETAMINE DIMESYLATE 40 MG OR CAPS
ORAL_CAPSULE | ORAL | Status: DC
Start: 2011-07-09 — End: 2011-08-05

## 2011-05-11 NOTE — Progress Notes (Signed)
Lisa Flynn is a 36 year old female who comes in today for a chief concern of ADD/ADHD follow-up.     Feels like this dose is good for her and she is liking the Vyvanse.     Current Medication(s): Vyvanse 40 mg   Overall Lisa Flynn is doing well, happy with dose.   Sleep: no problems  Diet and weight: appetite is stable, weight has increased a few pounds.   School/Work: going okay, has been having problems getting raises as expected, and she is still looking for other jobs.   Tics/SE: none    2) Tobacco use: patient reports that she started smoking again last week due to stressors in the family and child support services. Chantix helped last time and so she started on this again this morning as she knows that it worked last time.     3) Also requesting refill of hair piece rx. No refills of rx from Dr. Kyra Searles and patient needs renewed rx for her hair piece.     ROS:  Constitutional: Denies fever, chills, fatigue, malaise,  Cardiovascular: Denies chest pain, palpitations,  Respiratory: Denies cough, wheezing, shortness of breath  Neurological: Denies headaches, tremor, or weakness  Psychiatric: as above;     Review of patient's allergies indicates:  No Known Allergies    OBJECTIVE:  BP 120/70  Pulse 118  Temp(Src) 98.4 F (36.9 C) (Oral)  Resp 14  Wt 138 lb (62.596 kg)  SpO2 100%  General appearance: healthy, alert, no distress   Lungs: clear to auscultation  Cardiac:  Auscultation:  Regular rate and rhythm.  S1 and S2 are normal.  No murmurs, gallops or rubs.  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   Plan:   1. Lisdexamfetamine Dimesylate (VYVANSE) 40 MG Oral Cap, Lisdexamfetamine Dimesylate (VYVANSE) 40 MG Oral Cap, Lisdexamfetamine Dimesylate (VYVANSE) 40 MG Oral Cap  2. Follow up 6 months, sooner as needed. Okay to call back in three months to pick up additional three prescriptions at  the front desk.       305.1 Tobacco use disorder  Comment: restart Chantix  Plan:  1. Varenicline Tartrate (CHANTIX) 1 MG Oral Tab  2. Encouraged desire to quit, follow up as needed.          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   Plan: Unclassified (DME PRESCRIPTION)

## 2011-08-05 ENCOUNTER — Encounter (INDEPENDENT_AMBULATORY_CARE_PROVIDER_SITE_OTHER): Payer: Self-pay | Admitting: Nurse Practitioner

## 2011-08-05 MED ORDER — LISDEXAMFETAMINE DIMESYLATE 40 MG OR CAPS
ORAL_CAPSULE | ORAL | Status: DC
Start: 2011-09-08 — End: 2011-11-11

## 2011-08-05 MED ORDER — LISDEXAMFETAMINE DIMESYLATE 40 MG OR CAPS
ORAL_CAPSULE | ORAL | Status: DC
Start: 2011-10-09 — End: 2011-11-11

## 2011-08-05 MED ORDER — LISDEXAMFETAMINE DIMESYLATE 40 MG OR CAPS
ORAL_CAPSULE | ORAL | Status: DC
Start: 2011-08-09 — End: 2011-11-11

## 2011-08-05 NOTE — Telephone Encounter (Signed)
Sent message to pt to clarify what med she is asking for

## 2011-08-05 NOTE — Telephone Encounter (Signed)
Per chart notes on 04/2011 :"2. Follow up 6 months, sooner as needed. Okay to call back in three months to pick up additional three prescriptions at the front desk. "    Please review.

## 2011-08-05 NOTE — Telephone Encounter (Signed)
rx printed at front desk. Patient informed.

## 2011-11-11 ENCOUNTER — Ambulatory Visit (INDEPENDENT_AMBULATORY_CARE_PROVIDER_SITE_OTHER): Payer: PRIVATE HEALTH INSURANCE | Admitting: Nurse Practitioner

## 2011-11-11 ENCOUNTER — Encounter (INDEPENDENT_AMBULATORY_CARE_PROVIDER_SITE_OTHER): Payer: Self-pay | Admitting: Nurse Practitioner

## 2011-11-11 VITALS — BP 128/72 | HR 86 | Temp 99.0°F | Resp 15 | Wt 130.0 lb

## 2011-11-11 LAB — T4, FREE: Thyroxine (Free): 1.1 ng/dL (ref 0.6–1.2)

## 2011-11-11 LAB — THYROID STIMULATING HORMONE: Thyroid Stimulating Hormone: 0.036 u[IU]/mL — ABNORMAL LOW (ref 0.400–5.000)

## 2011-11-11 MED ORDER — LISDEXAMFETAMINE DIMESYLATE 40 MG OR CAPS
ORAL_CAPSULE | ORAL | Status: DC
Start: 2012-01-11 — End: 2012-02-04

## 2011-11-11 MED ORDER — BUPROPION HCL ER (SR) 150 MG OR TB12
EXTENDED_RELEASE_TABLET | ORAL | Status: DC
Start: 2011-11-11 — End: 2012-10-24

## 2011-11-11 MED ORDER — LISDEXAMFETAMINE DIMESYLATE 40 MG OR CAPS
ORAL_CAPSULE | ORAL | Status: DC
Start: 2011-12-12 — End: 2012-02-04

## 2011-11-11 MED ORDER — LISDEXAMFETAMINE DIMESYLATE 40 MG OR CAPS
ORAL_CAPSULE | ORAL | Status: DC
Start: 2011-11-11 — End: 2012-02-04

## 2011-11-11 NOTE — Progress Notes (Signed)
Lisa Flynn is a 36 year old female who comes in today for a chief concern of ADD/ADHD follow-up.     Medication feels that it is stable. Wearing out of system by about 8pm .     Current Medication(s): Vyvanse 40 mg   Overall [name] is doing well   Sleep: going well.   Diet and weight: stable,   School/Work: finally was able to get raise.   Tics/SE: none    Smoking: still wanting to quit. Has tried quitting smoking twice with the Chantix. Had a couple episodes of sharp abdominal pain. Wanting to try something different. Signed up at work for coaching for quitting. Smoking 1/2 ppd currently.     Had thyroid test 6 weeks ago at work and TSH was high, around 8. Told to have it rechecked. No hx of abnormal thyroid.     ROS:  Constitutional: Denies fever, chills, fatigue, malaise,  Cardiovascular: Denies chest pain, palpitations,  Respiratory: Denies cough, wheezing, shortness of breath  Neurological: Denies headaches, tremor, or weakness  Psychiatric: as above;   Endocrine: alopecia; no acute changes.     Review of patient's allergies indicates:  No Known Allergies    OBJECTIVE:  BP 128/72  Pulse 86  Temp 99 F (37.2 C) (Temporal)  Resp 15  Wt 130 lb (58.968 kg)  SpO2 90%  LMP 11/07/2011  General appearance: healthy, alert, no distress   Lungs: clear to auscultation  Cardiac:  Auscultation:  Regular rate and rhythm.  S1 and S2 are normal.  No murmurs, gallops or rubs.  Neuro: Alert interactive and appropriate for age  Neck: thyroid feels minimally enlarged overall, equal bilaterally with no nodules palpated.     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 Lisdexamfetamine Dimesylate (VYVANSE) 40 MG Oral Cap, Lisdexamfetamine Dimesylate (VYVANSE)40 MG Oral Cap, Lisdexamfetamine Dimesylate  (VYVANSE) 40 MG Oral Cap  2. Follow up 6 months, sooner as needed. Okay to call back in three months to pick up additional three  prescriptions at the front desk.     305.1 Tobacco use disorder  Comment: trial of Wellbutrin for smoking cessation.   Plan:  1. BuPROPion HCl ER, SR, 150 MG Oral TABLET SR 12 HR  2. Follow up as needed    794.5 Abnormal thyroid blood test  Comment: check levels today  Plan:  1. THYROID STIMULATING HORMONE, T4, FREE  2. Follow up based on results; may consider ultrasound of thyroid.     Total time of approximately 25 minutes was spent face-to-face with the patient, of which more than 50% was spent counseling and coordinating the patient's care as outlined in this note.

## 2011-11-12 ENCOUNTER — Other Ambulatory Visit (INDEPENDENT_AMBULATORY_CARE_PROVIDER_SITE_OTHER): Payer: Self-pay | Admitting: Nurse Practitioner

## 2011-12-08 ENCOUNTER — Encounter (INDEPENDENT_AMBULATORY_CARE_PROVIDER_SITE_OTHER): Payer: Self-pay | Admitting: Nurse Practitioner

## 2012-01-10 ENCOUNTER — Encounter (HOSPITAL_BASED_OUTPATIENT_CLINIC_OR_DEPARTMENT_OTHER): Payer: PRIVATE HEALTH INSURANCE

## 2012-01-10 ENCOUNTER — Other Ambulatory Visit (HOSPITAL_BASED_OUTPATIENT_CLINIC_OR_DEPARTMENT_OTHER): Payer: Self-pay | Admitting: "Endocrinology

## 2012-01-10 ENCOUNTER — Ambulatory Visit: Payer: PRIVATE HEALTH INSURANCE | Attending: Endocrinology | Admitting: Endocrinology

## 2012-01-10 DIAGNOSIS — E0520 Thyrotoxicosis with toxic multinodular goiter without thyrotoxic crisis or storm: Secondary | ICD-10-CM | POA: Insufficient documentation

## 2012-01-10 DIAGNOSIS — E079 Disorder of thyroid, unspecified: Secondary | ICD-10-CM | POA: Insufficient documentation

## 2012-01-10 LAB — THYROID STIMULATING HORMONE: Thyroid Stimulating Hormone: 0.032 u[IU]/mL — ABNORMAL LOW (ref 0.400–5.000)

## 2012-01-10 LAB — T3: Triiodothyronine (T3): 115 ng/dL (ref 73–178)

## 2012-01-10 LAB — T4, FREE: Thyroxine (Free): 0.9 ng/dL (ref 0.6–1.2)

## 2012-01-20 ENCOUNTER — Encounter (HOSPITAL_BASED_OUTPATIENT_CLINIC_OR_DEPARTMENT_OTHER): Payer: PRIVATE HEALTH INSURANCE | Admitting: "Endocrinology

## 2012-02-04 ENCOUNTER — Telehealth (INDEPENDENT_AMBULATORY_CARE_PROVIDER_SITE_OTHER): Payer: Self-pay | Admitting: Nurse Practitioner

## 2012-02-04 MED ORDER — LISDEXAMFETAMINE DIMESYLATE 40 MG OR CAPS
ORAL_CAPSULE | ORAL | Status: DC
Start: 2012-03-06 — End: 2012-03-15

## 2012-02-04 MED ORDER — LISDEXAMFETAMINE DIMESYLATE 40 MG OR CAPS
ORAL_CAPSULE | ORAL | Status: DC
Start: 2012-02-04 — End: 2012-03-15

## 2012-02-04 MED ORDER — LISDEXAMFETAMINE DIMESYLATE 40 MG OR CAPS
ORAL_CAPSULE | ORAL | Status: DC
Start: 2012-04-05 — End: 2012-05-11

## 2012-02-04 NOTE — Telephone Encounter (Signed)
Based on OV in 11/2011 ok to refill for another 3months. Scripts left with front desk. plz inform pt. Close te once picked up. Needs appt for futre refills  Thanks  Clyda Hurdle, MD

## 2012-02-04 NOTE — Telephone Encounter (Signed)
Informed patient that, RX is ready for pick-up. Patient will stop by the clinic on Monday (02/07/2012) to pick up RX.

## 2012-02-04 NOTE — Telephone Encounter (Signed)
CONFIRMED PHONE NUMBER: [phone]  CALLERS FIRST AND LAST NAME: Lisa Flynn  FACILITY NAME: na TITLE: na  CALLERS RELATIONSHIP:Self  RETURN CALL: Detailed message on voicemail only    SUBJECT: Prescription Management   REASON FOR REQUEST: Controled request    MEDICATION: Vyvanse  CONCERN/QUESTION: Need refill of prescription   PRESCRIBING PROVIDER: Platt  DOSE TAKING NOW: 40 mg  PHARMACY NAME, LOCATION, & PHONE #: patient will pick up

## 2012-03-06 ENCOUNTER — Ambulatory Visit (HOSPITAL_BASED_OUTPATIENT_CLINIC_OR_DEPARTMENT_OTHER): Payer: PRIVATE HEALTH INSURANCE

## 2012-03-06 ENCOUNTER — Other Ambulatory Visit (HOSPITAL_BASED_OUTPATIENT_CLINIC_OR_DEPARTMENT_OTHER): Payer: Self-pay | Admitting: "Endocrinology

## 2012-03-06 ENCOUNTER — Encounter (HOSPITAL_BASED_OUTPATIENT_CLINIC_OR_DEPARTMENT_OTHER): Payer: PRIVATE HEALTH INSURANCE | Admitting: "Endocrinology

## 2012-03-06 ENCOUNTER — Encounter (HOSPITAL_BASED_OUTPATIENT_CLINIC_OR_DEPARTMENT_OTHER): Payer: PRIVATE HEALTH INSURANCE

## 2012-03-06 ENCOUNTER — Ambulatory Visit: Payer: PRIVATE HEALTH INSURANCE | Attending: "Endocrinology | Admitting: "Endocrinology

## 2012-03-06 DIAGNOSIS — E0590 Thyrotoxicosis, unspecified without thyrotoxic crisis or storm: Secondary | ICD-10-CM | POA: Insufficient documentation

## 2012-03-06 DIAGNOSIS — E0510 Thyrotoxicosis with toxic single thyroid nodule without thyrotoxic crisis or storm: Secondary | ICD-10-CM | POA: Insufficient documentation

## 2012-03-07 LAB — PR US THYROID

## 2012-03-15 ENCOUNTER — Ambulatory Visit (INDEPENDENT_AMBULATORY_CARE_PROVIDER_SITE_OTHER): Payer: PRIVATE HEALTH INSURANCE | Admitting: Family Medicine

## 2012-03-15 ENCOUNTER — Encounter (INDEPENDENT_AMBULATORY_CARE_PROVIDER_SITE_OTHER): Payer: Self-pay | Admitting: Family Medicine

## 2012-03-15 VITALS — BP 110/72 | HR 70 | Temp 98.0°F | Resp 12 | Ht 64.5 in | Wt 137.0 lb

## 2012-03-15 MED ORDER — VARENICLINE TARTRATE (STARTER) 0.5 MG X 11 & 1 MG X 42 OR TBPK
ORAL_TABLET | ORAL | Status: DC
Start: 2012-03-15 — End: 2012-10-24

## 2012-03-15 NOTE — Progress Notes (Signed)
Lisa Flynn is a 36 year old female here today for a preventive health visit.   Other problems or concerns today: none    GYN HISTORY  Obstetric History    G1   P1   T1   P0   A0   TAB0   SAB0   E0   M0   L1      Patient's last menstrual period was 02/28/2012.   Periods are regular q 28-30 days, lasting 4 days.    Dysmenorrhea: none  Cyclic symptoms: none  Intermenstrual bleeding, spotting,or discharge: No   Last pap: 2012  Pap history: no history of abnormal paps  Contraception: yes:  IUD:  PARAGARD T380A  Other gyn history: none    SEXUAL HISTORY  Sexual activity: yes, single partner, contraception - IUD  Age at first intercourse: Not asked today  History of STD: none known  Number of sex partners in the past year: 1  Last STD check: not asked    Chlamydia screening offered: no  HIV screening offered: no  HPV vaccine offered: No  Sexual concerns: No  History of sexual or physical abuse: Not asked today  Have you been hit, kicked, punched, or otherwise hurt by someone within the past year:Not asked today    CANCER SCREENING  Family history of colon cancer: NO  Family history of uterine or ovarian cancer: NO  Family history of breast cancer: NO  Prior mammogram: NO  History of abnormal mammogram: NO    LIFESTYLE  Current dietary habits: healthy diet in general, skips dinner sometimes  Calcium: dietary sources only  Current exercise habits: cardiovascular workout on exercise equipment 2-3x per week  Regular seat belt use: YES  Substance use:  reports that she has been smoking Cigarettes.  She has been smoking about 0.00 packs per day for the past 15 years. She does not have any smokeless tobacco history on file. She reports that she does not drink alcohol.  She has cut down to 1-2 cigarettes per day.  Stress: No  Exposure to hazardous materials: No  Guns in the house: No    History sections of chart reviewed and updated today: YES    Review Of Systems  Weight: stable  Constitutional: denies fatigue,  fever, chills, sweats  Regular dental exams: yes  GI: Denies constipation, diarrhea, or blood in stools; denies nausea, dysphagia, heartburn or other abd. pain  GU: denies abnormal vaginal bleeding, discharge or unusual pelvic pain, denies dysuria, frequency or gross hematuria  Endocrine: subclinical hyperthyroidism- likely heading toward Graves disease.  Denies weight changes, hot or cold intolerance  Skin: denies rash or other active skin concerns  Psych: Depression symptoms: none.  Other: none    EXAM:  BP 110/72  Pulse 70  Temp(Src) 98 F (36.7 C) (Temporal)  Resp 12  Ht 5' 4.5" (1.638 m)  Wt 137 lb (62.143 kg)  BMI 23.16 kg/m2  LMP 02/28/2012  Body mass index is 23.16 kg/(m^2).  General appearance: healthy, alert, no distress  Skin: Skin color, texture, turgor normal. No rashes or concerning lesions  Neck: supple. No adenopathy. Thyroid symmetric, normal size, without nodules  Breasts: No obvious deformity or mass to inspection, nipples everted bilaterally, no skin lesion or nipple discharge, no mass palpated, no axillary lymphadenopathy, dense upper outer quadrants  Lungs: clear to auscultation  Heart: normal rate, regular rhythm and no murmurs, clicks, or gallops  Abdomen: soft, non-tender. BS normal. No masses or organomegaly  Pelvic: normal  bartholin/skene/urethral meatus/anus., Vagina is rugated and well-estrogenized, cervix normal in appearance, IUD strings 2 cm, no CMT, no bladder tenderness, uterus normal size, shape, and consistency, no adnexal masses or tenderness   Rectal: deferred    ASSESSMENT/PLAN:  Health Maintenance   Topic Date Due   . Influenza > 6 Months  01/10/2013   . Pap 3 Year W/ib Message  01/24/2014   . Tetanus Booster  01/18/2022     Immunizations or studies due: None- pt will drop off labs from work.    (V72.31) Routine gynecological examination  (primary encounter diagnosis):  Doing well, continue healthy diet and exercise.     (V45.51) PRESENCE OF IUD: doing well.    (305.1)  Tobacco use disorder:  Pt would like to try Chantix again, refilled as below, encouraged her to continue efforts.   Plan: Varenicline Tartrate (CHANTIX STARTING MONTH         PAK) 0.5 MG X 11 & 1 MG X 42 Oral Misc    Preventive counseling: contraception  STD prevention  breast self-exam  dental care  moderation in EtOH use/no drinking and driving  low-fat high-fiber diet  adequate calcium intake  vitamin D supplementation  proper exercise  Follow-up: 1 year  Lab tests and results of any diagnostic studies will be shared with the patient by  eCare

## 2012-03-15 NOTE — Patient Instructions (Addendum)
1.  Please drop off copy of labs from work.    Leading a Healthy Life  Six tips to help improve your health and wellness     This explains how these 6 basic guidelines may improve your health and wellness:     Eat well to give your body the energy it needs.     Stay or get active.     A healthy mind is part of a healthy body.     Practice safe living habits.     Keep your mind and body free of harmful drugs and alcohol.     Get regular health care.     Tip #1: Eat well to give your body the energy it needs.   Your body needs nutritious foods to stay strong and healthy.   Here are some general eating guidelines:     Have 2 servings of fish or other seafood 2 times a week (1 serving = 4 ounces).     If you eat dairy products, choose low-fat (1%) or nonfat ones.     If you eat meat, cut down on the amount. Replace it with plant-based foods such as beans, whole grains, fruits and vegetables, and nuts and seeds.     Have less than 1,500 mg of sodium (salt) a day.     Cut down on "junk food" like alcohol, fatty foods, chips, candy, and other sweets.     Tip #2: Stay or get active.   Exercise for at least 30 minutes at a time, 3 times a week. Regular physical activity can help you:     Live longer and feel better     Be stronger and more flexible     Build strong bones     Prevent depression     Strengthen your immune system     Maintain a healthy body weight     Tip #3: Remember: A healthy mind is part of a healthy body.   A good state of mind can help you make healthy choices. Here are a few tips for keeping your mind healthy:     Reduce stress in your life.     Make some time every day for things that are fun.     Get enough sleep. Lack of sleep reduces how well you can concentrate, increases mood swings, and raises your risk of having a car accident.     Ask your health care provider for help if you feel depressed or anxious for more than several days in a row.     Tip #4: Practice safe living habits.   Accidents and  Injuries        Accidents and injuries are the 5th leading cause of death in the [place]     Women under age 88 are more likely to die in motor vehicle accidents than from any other cause.     Accidents in the home cause thousands of permanent injuries every year.     The most common accidents are fires, falls, and drowning. To help yourself and your family stay safe:     Install smoke detectors on each floor of your home.     Make sure everyone in your family knows how to swim    Stay safe on the road:  o Wear a seatbelt.   o Do not ride with someone who has been drinking or taking drugs.   o Do not speak on a cell phone or send, read,  or write text messages while you are driving.   o Wear a helmet when you ride a bicycle or motorcycle.   o Get enough sleep at night, and do not drive when you are tired.     Hand Hygiene   Protect yourself from germs by washing your hands often. Always wash your hands:     After you change a diaper or use the toilet     Before you start and after you finish preparing food     Tip #5: Keep your mind and body free of harmful drugs and alcohol.   Tobacco causes more health problems than any other substance. These problems include lung disease, heart disease, and many types of cancer. The nicotine in tobacco is the most addictive and widely used drug.   Too much alcohol can cause damage to your liver, heart, brain, bones, and other body tissues. Being under the influence of alcohol also increases your chance of being injured in an accident.    Alcohol can cause fetal alcohol syndrome in your children if you drink regularly when you are pregnant.   Street drugs, like marijuana, cocaine, methamphetamine, heroin, or pain pills not prescribed by your doctor can harm your health. They may be mixed with harmful substances, and using them can cause people to put themselves in dangerous situations.     Tip #6: Get regular health care.   Many people think they need to see the doctor only when they  are sick. But, health care providers can also help you stay healthy.     Find a health care provider who works with you to manage your health.     Ask your health care provider what diseases you are at risk for. Learn what you can do to prevent or control them.     Get yourself and your family immunized against life-threatening diseases.   MAMMOGRAPHY SERVICES    Screening Mammograms  [facility] is pleased to offer our patients the convenience of having screening mammograms performed right at your neighborhood clinic. Your x-ray will be taken by a registered mammography technologist and will be read by a radiologist at the [facility] who specializes in mammography.     Scheduling  Scheduling your mammogram is easy!  Just stop at the front desk of your clinic or call us to schedule your 15 minute appointment.  Mammography services are available one day every other week.  DO NOT WEAR POWDER OR DEODORANT ON THE DAY OF YOUR APPOINTMENT.    Diagnostic Mammograms   Some patients need more in-depth studies than what we are able to perform at your neighborhood clinic.  If your doctor wants you to have a diagnostic mammogram, the referral coordinator at your clinic will assist you in making an appointment at [facility] or at [facility].     Test Results   The results of your mammogram will be sent to you in the mail by the [facility] Radiology Department.  You should receive your results within 7-10 working days. Your doctor will also receive a copy of this report.  If further mammography studies are required, you will be asked to schedule a follow-up appointment with the [facility] Mammography Department.     Repeat Mammograms   Occasionally, the mammogram will be difficult for the radiologist to read, due to motion effect.  If this is the case, you will receive a letter asking you  to schedule a repeat mammogram at your  neighborhood clinic at no charge.     Billing/Insurance   You will receive a bill from [employer] stating the name of the radiologist who interpreted your mammogram.  If you have insurance, we will also bill your insurance company.  It is suggested that you call your insurance company to check your coverage.      Recommendations for Pap Smear screening age 71-65      If you have had three normal Pap test results in a row and no new partners, pap smear every 3 years OR pap smear and HPV screening every 5 years  o Exceptions:     if you have ever been treated for moderate or severe dysplasia of the cervix and have completed post-treatment surveillance, you can return to routine screening for your age group, and continue Pap screening for at least twenty years.    If you are HIV positive you should have a pap smear every six months for a year after diagnosis and then every year.    If you have a suppressed immune system you should have a pap yearly    Discontinue screening at age 85 if there have been three consecutive normal Pap smears and no abnormal in the past ten years with the most recent testing in the last 5 years    If you are sexually active, you should have a yearly screening for Chlamydia, HIV, and other sexually transmitted infections as appropriate    If you are using contraception, you should see your provider yearly for symptom review, blood pressure check (if using a hormonal method), and exam if needed.

## 2012-04-07 ENCOUNTER — Other Ambulatory Visit (INDEPENDENT_AMBULATORY_CARE_PROVIDER_SITE_OTHER): Payer: Self-pay | Admitting: Family Medicine

## 2012-04-08 ENCOUNTER — Other Ambulatory Visit (INDEPENDENT_AMBULATORY_CARE_PROVIDER_SITE_OTHER): Payer: Self-pay | Admitting: Family Medicine

## 2012-04-09 NOTE — Telephone Encounter (Signed)
The medication requested requires your approval because it is not on RAC protocol: Chantix that was started on 03/15/12.  Patient may need continuing pack instead.    OK?  If not approved please notify patient.

## 2012-04-10 MED ORDER — VARENICLINE TARTRATE 1 MG OR TABS
ORAL_TABLET | ORAL | Status: DC
Start: 2012-04-09 — End: 2012-10-24

## 2012-04-10 NOTE — Telephone Encounter (Signed)
Refilled continuing pack.

## 2012-04-10 NOTE — Telephone Encounter (Signed)
Per below, continuing pack sent to pharmacy.  Closing TE

## 2012-05-11 ENCOUNTER — Encounter (INDEPENDENT_AMBULATORY_CARE_PROVIDER_SITE_OTHER): Payer: Self-pay | Admitting: Nurse Practitioner

## 2012-05-11 ENCOUNTER — Ambulatory Visit (INDEPENDENT_AMBULATORY_CARE_PROVIDER_SITE_OTHER): Payer: PRIVATE HEALTH INSURANCE | Admitting: Nurse Practitioner

## 2012-05-11 VITALS — BP 110/69 | HR 85 | Temp 98.6°F | Resp 15 | Wt 140.0 lb

## 2012-05-11 MED ORDER — LISDEXAMFETAMINE DIMESYLATE 40 MG OR CAPS
ORAL_CAPSULE | ORAL | Status: DC
Start: 2012-06-09 — End: 2012-10-24

## 2012-05-11 MED ORDER — LISDEXAMFETAMINE DIMESYLATE 40 MG OR CAPS
ORAL_CAPSULE | ORAL | Status: DC
Start: 2012-07-07 — End: 2012-10-24

## 2012-05-11 MED ORDER — LISDEXAMFETAMINE DIMESYLATE 40 MG OR CAPS
ORAL_CAPSULE | ORAL | Status: DC
Start: 2012-08-07 — End: 2012-10-24

## 2012-05-11 MED ORDER — LISDEXAMFETAMINE DIMESYLATE 40 MG OR CAPS
ORAL_CAPSULE | ORAL | Status: DC
Start: 2012-05-11 — End: 2012-10-24

## 2012-05-11 MED ORDER — LISDEXAMFETAMINE DIMESYLATE 40 MG OR CAPS
ORAL_CAPSULE | ORAL | Status: DC
Start: 2012-10-07 — End: 2012-10-24

## 2012-05-11 MED ORDER — LISDEXAMFETAMINE DIMESYLATE 40 MG OR CAPS
ORAL_CAPSULE | ORAL | Status: DC
Start: 2012-09-06 — End: 2012-10-24

## 2012-05-11 NOTE — Patient Instructions (Signed)
It was a pleasure to see you in clinic today.     You can schedule an appointment to see us by calling [phone] or via eCare. If you are not yet signed up for eCare, please speak with a team member at the front desk.    eCare enrollment will allow you to make appointments online, view test results and obtain a copy of our After Visit Summary.     If labs were ordered today the results are expected to be available via eCare 5 days later. If viewing your results on eCare, please make sure to scroll all the way to the bottom of the results to view any messages written by your provider. If you have an active eCare account, this is where we will submit your results. If you wish to receive the results over the phone or via letter, please call our office directly at [phone] and leave a message for your provider.     Otherwise, result letters are mailed 7-14 days after your tests are completed. If your physician needs to change your care based on your results, you will receive a phone call to notify you. If you haven't heard from him/her and it has been more than 14 days please give us a call.     Medication refills: If you need to get a prescription refill please contact your pharmacy 1 week before your current supply will run out to request the refill. Contacting your pharmacy is the fastest and easiest way to obtain a medication refill. The pharmacy will notify our office. Please note that a minimum of 48-72 hours is needed to refill a medication. Please call your pharmacy early to allow enough time to refill before you anticipate running out.     We know that you have a choice in where you receive your healthcare and we sincerely thank you for choosing [facility].      Thank you,   Nanette Del Rosario  Medical Assistant

## 2012-05-11 NOTE — Progress Notes (Signed)
Lisa Flynn is a 37 year old female who comes in today for a chief concern of ADD/ADHD follow-up.     Feels like since she has been taking Chantix, concentration is a little bit worse. Will feel forgetful at times and distracted while at work. Overall prior to the Chantix, felt like this was a good dose and she does not want to change. Chantix should be finished in the next month.     Current Medication(s): Vyvanse 40 mg   Overall [name] is doing well  Sleep: no problems  Diet and weight: appetite stable  School/Work: work is going well, somewhat scattered at times  Tics/SE: none    Has been doing Chantix for 2 months and not smoking and feeling good. No side effects and feels confident about quitting.     ROS:  Constitutional: Denies fever, chills, fatigue, malaise,  Cardiovascular: Denies chest pain, palpitations,  Respiratory: Denies cough, wheezing, shortness of breath  Neurological: Denies headaches, tremor, or weakness  Psychiatric: as above;     Review of patient's allergies indicates:  No Known Allergies    OBJECTIVE:  BP 110/69  Pulse 85  Temp(Src) 98.6 F (37 C) (Temporal)  Resp 15  Wt 140 lb (63.504 kg)  BMI 23.67 kg/m2  SpO2 99%  LMP 05/09/2012  General appearance: healthy, alert, no distress   Lungs: clear to auscultation  Cardiac:  Auscultation:  Regular rate and rhythm.  S1 and S2 are normal.  No murmurs, gallops or rubs.  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 Lisdexamfetamine Dimesylate (VYVANSE) 40 MG Oral Cap, Lisdexamfetamine Dimesylate (VYVANSE)40 MG Oral Cap, Lisdexamfetamine Dimesylate (VYVANSE) 40 MG Oral Cap, Lisdexamfetamine Dimesylate (VYVANSE) 40 MG Oral Cap, Lisdexamfetamine Dimesylate (VYVANSE) 40 MG   Oral Cap, Lisdexamfetamine Dimesylate  (VYVANSE)40 MG Oral Cap  2. Follow up 6  months, sooner as needed.

## 2012-10-20 ENCOUNTER — Other Ambulatory Visit (INDEPENDENT_AMBULATORY_CARE_PROVIDER_SITE_OTHER): Payer: Self-pay | Admitting: Nurse Practitioner

## 2012-10-20 NOTE — Telephone Encounter (Signed)
From: Lisa Flynn  To: Marcina Millard  Sent: 10/20/2012 9:01 AM PDT  Subject: Medication Renewal Request    Original authorizing provider: Marcina Millard, ARNP    Lisa Flynn would like a refill of the following medications:  Lisdexamfetamine Dimesylate (VYVANSE) 40 MG Oral Cap Marcina Millard, ARNP]    Preferred pharmacy: [facility] [address] [place] [phone]     Comment:  Rica Mote, Meds have been working for me just fine and I am not sure if I need to come in for appointment or not. I am on my last bottle and will need a refill. Please let me know if I need to come in for the appointment or i can stop by and pick up refill paper. Thanks, Lisa Flynn [phone]

## 2012-10-20 NOTE — Telephone Encounter (Signed)
I called Pt and advised her that she needs to be seen. I scheduled her an appt on Tue 10/24/12 for 4:20 pm with Adelina Mings. Refill will be addressed at that time. Closing TE

## 2012-10-24 ENCOUNTER — Encounter (INDEPENDENT_AMBULATORY_CARE_PROVIDER_SITE_OTHER): Payer: Self-pay | Admitting: Nurse Practitioner

## 2012-10-24 ENCOUNTER — Ambulatory Visit (INDEPENDENT_AMBULATORY_CARE_PROVIDER_SITE_OTHER): Payer: PRIVATE HEALTH INSURANCE | Admitting: Nurse Practitioner

## 2012-10-24 VITALS — BP 102/64 | HR 88 | Temp 99.3°F | Resp 15 | Wt 135.9 lb

## 2012-10-24 MED ORDER — LISDEXAMFETAMINE DIMESYLATE 40 MG OR CAPS
ORAL_CAPSULE | ORAL | Status: DC
Start: 2013-01-05 — End: 2013-02-05

## 2012-10-24 MED ORDER — LISDEXAMFETAMINE DIMESYLATE 40 MG OR CAPS
ORAL_CAPSULE | ORAL | Status: DC
Start: 2012-11-04 — End: 2013-02-28

## 2012-10-24 MED ORDER — LISDEXAMFETAMINE DIMESYLATE 40 MG OR CAPS
ORAL_CAPSULE | ORAL | Status: DC
Start: 2012-12-05 — End: 2013-02-28

## 2012-10-24 NOTE — Progress Notes (Signed)
Lisa Flynn is a 37 year old female who comes in today for a chief concern of ADD/ADHD follow-up.     Finds the medication is working okay for her. Dose at times is feeling low however would prefer to avoid dose increase at this time.     Current Medication(s): Vyvanse 40 mg   Overall Lisa Flynn is doing well  Sleep: no problems.   Diet and weight: appetite is normal.   School/Work: stressful recently and did not get the promotion that she was promised.   Tics/SE: none    Chantix helped and was able to quit for approximately January to March and now up to 1/2 ppd. Finds that stress makes it difficult and she started back up. Not feeling ready to quit again at this time and insurance if not covering chantix this year.     ROS:  Constitutional: Denies fever, chills, fatigue, malaise,  Cardiovascular: Denies chest pain, palpitations,  Respiratory: Denies cough, wheezing, shortness of breath  Neurological: Denies headaches, tremor, or weakness  Psychiatric: as above;     Review of patient's allergies indicates:  No Known Allergies    OBJECTIVE:  BP 102/64  Pulse 88  Temp(Src) 99.3 F (37.4 C) (Temporal)  Resp 15  Wt 135 lb 14.4 oz (61.644 kg)  BMI 22.98 kg/m2  SpO2 100%  LMP 10/22/2012  General appearance: healthy, alert, no distress   Lungs: clear to auscultation  Cardiac:  Auscultation:  Regular rate and rhythm.  S1 and S2 are normal.  No murmurs, gallops or rubs.  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 Lisdexamfetamine Dimesylate (VYVANSE) 40 MG Oral Cap, Lisdexamfetamine Dimesylate (VYVANSE)  40 MG Oral Cap, Lisdexamfetamine Dimesylate (VYVANSE) 40 MG Oral Cap  2. Follow up 6 months, sooner as needed. Okay to call back in three months to pick up additional three prescriptions at the front desk.     Continued to encourage smoking cessation; patient will consider Chantix again when she is feeling ready  to quit.

## 2012-10-24 NOTE — Patient Instructions (Addendum)
Okay to call back in three months to pick up additional three prescriptions at the front desk.       It was a pleasure to see you in clinic today.     You can schedule an appointment to see us by calling [phone] or via eCare. If you are not yet signed up for eCare, please speak with a team member at the front desk.    eCare enrollment will allow you to make appointments online, view test results and obtain a copy of our After Visit Summary.     If labs were ordered today the results are expected to be available via eCare 5 days later. If viewing your results on eCare, please make sure to scroll all the way to the bottom of the results to view any messages written by your provider. If you have an active eCare account, this is where we will submit your results. If you wish to receive the results over the phone or via letter, please call our office directly at [phone] and leave a message for your provider.     Otherwise, result letters are mailed 7-14 days after your tests are completed. If your physician needs to change your care based on your results, you will receive a phone call to notify you. If you haven't heard from him/her and it has been more than 14 days please give us a call.     Medication refills: If you need to get a prescription refill please contact your pharmacy 1 week before your current supply will run out to request the refill. Contacting your pharmacy is the fastest and easiest way to obtain a medication refill. The pharmacy will notify our office. Please note that a minimum of 48-72 hours is needed to refill a medication. Please call your pharmacy early to allow enough time to refill before you anticipate running out.     We know that you have a choice in where you receive your healthcare and we sincerely thank you for choosing [facility].      Thank you,   Wyett Narine Del Rosario  Medical Assistant

## 2013-01-29 ENCOUNTER — Encounter (INDEPENDENT_AMBULATORY_CARE_PROVIDER_SITE_OTHER): Payer: Self-pay

## 2013-02-05 ENCOUNTER — Other Ambulatory Visit: Payer: Self-pay

## 2013-02-05 ENCOUNTER — Other Ambulatory Visit (INDEPENDENT_AMBULATORY_CARE_PROVIDER_SITE_OTHER): Payer: Self-pay | Admitting: Nurse Practitioner

## 2013-02-05 MED ORDER — LISDEXAMFETAMINE DIMESYLATE 40 MG OR CAPS
40.0000 mg | ORAL_CAPSULE | Freq: Every day | ORAL | Status: DC
Start: 2013-02-05 — End: 2013-02-28

## 2013-02-05 NOTE — Telephone Encounter (Signed)
Routing to covering provider.  °

## 2013-02-05 NOTE — Telephone Encounter (Signed)
From: Lisa Flynn  To: Marcina Millard  Sent: 02/05/2013 8:53 AM PDT  Subject: Medication Renewal Request    Original authorizing provider: Marcina Millard, ARNP    Lisa Flynn would like a refill of the following medications:  Lisdexamfetamine Dimesylate (VYVANSE) 40 MG Oral Cap Marcina Millard, ARNP]    Preferred pharmacy:  [id number] [address] [place] [phone]     Comment:  Hi I would like to request a refill on Vyvanse as i am almost out. But in addition, I will schedule an appointment to see Marcina Millard as this medicine does not work as good as it used to. Thank you

## 2013-02-05 NOTE — Telephone Encounter (Signed)
Spoke to patient, let her know it is ready for pick up.

## 2013-02-05 NOTE — Telephone Encounter (Signed)
Prescription printed. Available for pickup at front desk. Please contact patient to let her know it is ready.  Thank you.

## 2013-02-08 ENCOUNTER — Encounter (INDEPENDENT_AMBULATORY_CARE_PROVIDER_SITE_OTHER): Payer: Self-pay | Admitting: Family Medicine

## 2013-02-08 ENCOUNTER — Ambulatory Visit (INDEPENDENT_AMBULATORY_CARE_PROVIDER_SITE_OTHER): Payer: PRIVATE HEALTH INSURANCE | Admitting: Family Medicine

## 2013-02-08 VITALS — BP 104/62 | HR 80 | Temp 98.4°F | Resp 12 | Wt 136.0 lb

## 2013-02-08 NOTE — Progress Notes (Signed)
1:21 PM  Lisa Flynn is a 37 year old year old female  who presents to clinic today for the following:    1.  Patient presents for suture removal, cut herself with a knife while carving pumpkins 2 weeks ago. The wound is well healed without signs of infection.    Still having pain in right wrist with flexion and extension, numbness in right thumb, no weakness.  Has been using splint 24/7.    (reviewed and updated today):  Patient Active Problem List   Diagnosis   . ALOPECIA AREATA   . TOBACCO USE DISORDER   . HEADACHE   . PRESENCE OF IUD   . CONTROLLED SUBSTANCES PLAN ON FILE   . ATTN DEFICIT NONHYPERACT   . Subclinical hyperthyroidism     meds reviewed as below    O: VS BP 104/62  Pulse 80  Temp(Src) 98.4 F (36.9 C) (Temporal)  Resp 12  Wt 136 lb (61.689 kg)  BMI 22.99 kg/m2  LMP 01/15/2013  General appearance: healthy, alert, no distress  Skin:  Well healed vertical laceration right medial wrist with 3 interrupted sutures.  Extremities: pain with flexion and extension right wrist. Normal grip strength    A/P:  (881.02) Laceration of right wrist, initial encounter  (primary encounter diagnosis):   Home exercises for wrist given.  Start using brace less, f/u if pain not improving over the next few weeks.  The sutures are removed. Steri strips placed. Return prn.      (V04.81) Need for prophylactic vaccination and inoculation against influenza  Plan: influenza vaccine quadrivalent PF (adult) 0.5         mL IM - [id number]

## 2013-02-08 NOTE — Progress Notes (Signed)
Vaccine Screening Questions      1.  Have you had a serious reaction or an allergic reaction to a vaccine?  NO    2.  Currently have a moderate or severe illness, including fever? (Don't Ask if vaccine   ordered by provider same day)  NO    3.  Ever had a seizure or a brain or other nervous system problem syndrome associated with a vaccine? (DTaP/TDaP/DTP pertinent) NO    4.  Is patient receiving  any live vaccinations today? (Varicella-Chickenpox, MMR-Measles/Mumps/Rubella, Zoster-Shingles)  NO    If YES to any of the questions above - Do NOT give vaccine.  Consult with RN or provider in clinic.  (#4 can be YES if all Live vaccine questions are answered NO)    If NO to all questions above - Patient may receive vaccine.    5.  Do you need to receive the Flu vaccine today? YES - Additional Flu Questions  Flu Vaccine Screening Questions:    Ever had a serious allergic reaction to eggs?  NO    Ever had Guillain-Barre syndrome associated with a vaccine? NO    Less than 6 months old? NO    If YES to any of the Flu questions above - NO Flu Vaccine to be given.  Patient may consult provider as needed.    If NO to all questions above - Patient may receive Flu Shot (IM)    If between 6 months and 58 years of age, was flu vaccine received last year?  YES  If NO to above question:  . For the 2014-15 season, administer 2 doses (separated by at least 4 weeks) to children who are receiving influenza vaccine for the first time.   . For the 2014-15 season, follow dosing guidelines in the 2013 ACIP influenza vaccine recommendations     Flu Mist (Nasal) may be considered if patient is >2 yrs old and < 68 yrs old.    Do you wish to proceed with screening questions for Flu Mist?  NO     Vaccine information sheet(s) discussed, patient/parent/guardian verbalized understanding? YES     VIS given 02/08/2013 by Joretta Bachelor MA.    Vaccine given today without initial adverse effect. YES    Dominga Ferry, Scientist, forensic

## 2013-02-14 ENCOUNTER — Ambulatory Visit (INDEPENDENT_AMBULATORY_CARE_PROVIDER_SITE_OTHER): Payer: PRIVATE HEALTH INSURANCE | Admitting: Family Medicine

## 2013-02-14 ENCOUNTER — Encounter (INDEPENDENT_AMBULATORY_CARE_PROVIDER_SITE_OTHER): Payer: Self-pay | Admitting: Family Medicine

## 2013-02-14 VITALS — BP 112/74 | HR 77 | Temp 98.1°F | Ht 64.5 in | Wt 134.6 lb

## 2013-02-14 MED ORDER — NICOTINE POLACRILEX 2 MG MT GUM
2.0000 mg | CHEWING_GUM | OROMUCOSAL | Status: DC | PRN
Start: 2013-02-14 — End: 2014-01-04

## 2013-02-14 MED ORDER — NICOTINE 7 MG/24HR TD PT24
1.0000 | MEDICATED_PATCH | Freq: Every day | TRANSDERMAL | Status: DC
Start: 2013-02-14 — End: 2014-01-04

## 2013-02-14 MED ORDER — NICOTINE 14 MG/24HR TD PT24
1.0000 | MEDICATED_PATCH | Freq: Every day | TRANSDERMAL | Status: DC
Start: 2013-02-14 — End: 2014-01-04

## 2013-02-14 NOTE — Patient Instructions (Signed)
Recommendations for Pap Smear screening age 37-65      If you have had three normal Pap test results in a row and no new partners, pap smear every 3 years OR pap smear and HPV screening every 5 years  o Exceptions:     if you have ever been treated for moderate or severe dysplasia of the cervix and have completed post-treatment surveillance, you can return to routine screening for your age group, and continue Pap screening for at least twenty years.    If you are HIV positive you should have a pap smear every six months for a year after diagnosis and then every year.    If you have a suppressed immune system you should have a pap yearly    Discontinue screening at age 65 if there have been three consecutive normal Pap smears and no abnormal in the past ten years with the most recent testing in the last 5 years    If you are sexually active, you should have a yearly screening for Chlamydia, HIV, and other sexually transmitted infections as appropriate    If you are using contraception, you should see your provider yearly for symptom review, blood pressure check (if using a hormonal method), and exam if needed.  Leading a Healthy Life  Six tips to help improve your health and wellness     This explains how these 6 basic guidelines may improve your health and wellness:     Eat well to give your body the energy it needs.     Stay or get active.     A healthy mind is part of a healthy body.     Practice safe living habits.     Keep your mind and body free of harmful drugs and alcohol.     Get regular health care.     Tip #1: Eat well to give your body the energy it needs.   Your body needs nutritious foods to stay strong and healthy.   Here are some general eating guidelines:     Have 2 servings of fish or other seafood 2 times a week (1 serving = 4 ounces).     If you eat dairy products, choose low-fat (1%) or nonfat ones.     If you eat meat, cut down on the amount. Replace it with plant-based foods such as beans,  whole grains, fruits and vegetables, and nuts and seeds.     Have less than 1,500 mg of sodium (salt) a day.     Cut down on "junk food" like alcohol, fatty foods, chips, candy, and other sweets.     Tip #2: Stay or get active.   Exercise for at least 30 minutes at a time, 3 times a week. Regular physical activity can help you:     Live longer and feel better     Be stronger and more flexible     Build strong bones     Prevent depression     Strengthen your immune system     Maintain a healthy body weight     Tip #3: Remember: A healthy mind is part of a healthy body.   A good state of mind can help you make healthy choices. Here are a few tips for keeping your mind healthy:     Reduce stress in your life.     Make some time every day for things that are fun.     Get enough sleep. Lack of   sleep reduces how well you can concentrate, increases mood swings, and raises your risk of having a car accident.     Ask your health care provider for help if you feel depressed or anxious for more than several days in a row.     Tip #4: Practice safe living habits.   Accidents and Injuries        Accidents and injuries are the 5th leading cause of death in the [place]     Women under age 35 are more likely to die in motor vehicle accidents than from any other cause.     Accidents in the home cause thousands of permanent injuries every year.     The most common accidents are fires, falls, and drowning. To help yourself and your family stay safe:     Install smoke detectors on each floor of your home.     Make sure everyone in your family knows how to swim    Stay safe on the road:  o Wear a seatbelt.   o Do not ride with someone who has been drinking or taking drugs.   o Do not speak on a cell phone or send, read, or write text messages while you are driving.   o Wear a helmet when you ride a bicycle or motorcycle.   o Get enough sleep at night, and do not drive when you are tired.     Hand Hygiene   Protect yourself from germs by  washing your hands often. Always wash your hands:     After you change a diaper or use the toilet     Before you start and after you finish preparing food     Tip #5: Keep your mind and body free of harmful drugs and alcohol.   Tobacco causes more health problems than any other substance. These problems include lung disease, heart disease, and many types of cancer. The nicotine in tobacco is the most addictive and widely used drug.   Too much alcohol can cause damage to your liver, heart, brain, bones, and other body tissues. Being under the influence of alcohol also increases your chance of being injured in an accident.    Alcohol can cause fetal alcohol syndrome in your children if you drink regularly when you are pregnant.   Street drugs, like marijuana, cocaine, methamphetamine, heroin, or pain pills not prescribed by your doctor can harm your health. They may be mixed with harmful substances, and using them can cause people to put themselves in dangerous situations.     Tip #6: Get regular health care.   Many people think they need to see the doctor only when they are sick. But, health care providers can also help you stay healthy.     Find a health care provider who works with you to manage your health.     Ask your health care provider what diseases you are at risk for. Learn what you can do to prevent or control them.     Get yourself and your family immunized against life-threatening diseases.

## 2013-02-14 NOTE — Progress Notes (Signed)
Lisa Flynn is a 37 year old female here today for a preventive health visit.   Other problems or concerns today:   Still having right wrist pain, started before the laceration.  Bracing at night, only slightly improved    GYN HISTORY  Obstetric History    G1   P1   T1   P0   A0   TAB0   SAB0   E0   M0   L1      Patient's last menstrual period was 02/09/2013.   Periods are regular q 28-30 days, lasting 3 days.    Dysmenorrhea: none  Cyclic symptoms: irritability  Intermenstrual bleeding, spotting,or discharge: No   Last pap: 2012  Pap history: no history of abnormal paps  Contraception: yes:  IUD:  PARAGARD T380A  Other gyn history: none    SEXUAL HISTORY  Sexual activity: yes, single partner, contraception - abstinence  Age at first intercourse: Not asked today  History of STD: none known  Number of sex partners in the past year: 0  Last STD check: 1    Chlamydia screening offered: no  HIV screening offered: no  HPV vaccine offered: No  Sexual concerns: No  History of sexual or physical abuse: Not asked today  Have you been hit, kicked, punched, or otherwise hurt by someone within the past year:Not asked today    CANCER SCREENING  Family history of colon cancer: NO  Family history of uterine or ovarian cancer: NO  Family history of breast cancer: NO  Prior mammogram: NO  History of abnormal mammogram: N/A    LIFESTYLE  Current dietary habits: healthy diet in general  Calcium: dietary sources only  Current exercise habits: yes, engages in regular exercise  Regular seat belt use: YES  Substance use:  reports that she has been smoking Cigarettes.  She has been smoking about 0.00 packs per day for the past 15 years. She does not have any smokeless tobacco history on file. She reports that she does not drink alcohol.  Stress: No  Exposure to hazardous materials: Not asked today  Guns in the house: Not asked today    History sections of chart reviewed and updated today: YES    Review Of Systems  Weight:  stable  Constitutional: denies fatigue, fever, chills, sweats  Regular dental exams: yes  GI: Denies constipation, diarrhea, or blood in stools; denies nausea, dysphagia, heartburn or other abd. pain  GU: denies abnormal vaginal bleeding, discharge or unusual pelvic pain, denies dysuria, frequency or gross hematuria  Endocrine: thyroiditis last year, did not follow up with endocrinology.  Denies hot or cold intolerance or unusual weight changes  Skin:  Alopecia aerata stable  Psych: Depression symptoms: none.  Other: none    EXAM:  BP 112/74  Pulse 77  Temp(Src) 98.1 F (36.7 C) (Oral)  Ht 5' 4.5" (1.638 m)  Wt 134 lb 9.6 oz (61.054 kg)  BMI 22.76 kg/m2  SpO2 100%  LMP 02/09/2013  Breastfeeding? No  Body mass index is 22.76 kg/(m^2).  General appearance: healthy, alert, no distress  Skin: Skin color, texture, turgor normal. No rashes or concerning lesions  Neck: supple. No adenopathy. Thyroid symmetric, normal size, without nodules  Breasts: No obvious deformity or mass to inspection, nipples everted bilaterally, no skin lesion or nipple discharge, no mass palpated, no axillary lymphadenopathy, fibrocystic change without dominant masses  Lungs: clear to auscultation  Heart: normal rate, regular rhythm and no murmurs, clicks, or gallops  Abdomen:  soft, non-tender. BS normal. No masses or organomegaly  Pelvic: deferred   Rectal: deferred    ASSESSMENT/PLAN:  Health Maintenance   Topic Date Due   . Influenza >9 Yrs  12/11/2013   . Pap 3 Year W/ib Message  01/24/2014   . Tetanus Booster  01/18/2022   . Pertussis Vaccine >11 Yrs  Completed     Immunizations or studies due: Yes, see orders    (V72.31) Routine gynecological examination  (primary encounter diagnosis):  Routine advice as below.    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   Plan: THYROID STIMULATING HORMONE, T4, FREE    (V72.62) Laboratory exam ordered as part of routine general medical examination  Plan:  LIPID PANEL, GLUCOSE SERUM, NONFASTING    (305.1) Tobacco use:  Strongly recommend tobacco cessation.  Pt will try patch with prn gum, set a quit date.  F/u prn, may need a repeat trial of Chantix that has worked well for her in the past  Plan: Nicotine Polacrilex 2 MG Mouth/Throat Gum,         Nicotine 14 MG/24HR Transdermal PATCH 24 HR,         Nicotine 7 MG/24HR Transdermal PATCH 24 HR      Preventive counseling: contraception  STD prevention  skin cancer prevention/self-exam  immunizations  dental care  low-fat high-fiber diet  adequate calcium intake  vitamin D supplementation  proper exercise  Follow-up: 1 year  Lab tests and results of any diagnostic studies will be shared with the patient by  eCare

## 2013-02-15 LAB — LIPID PANEL
Cholesterol (LDL): 102 mg/dL (ref <130)
Cholesterol/HDL Ratio: 2.4
HDL Cholesterol: 81 mg/dL (ref >40)
Non-HDL Cholesterol: 111 mg/dL (ref 0–159)
Total Cholesterol: 192 mg/dL (ref <200)
Triglyceride: 45 mg/dL (ref <150)

## 2013-02-15 LAB — THYROID STIMULATING HORMONE: Thyroid Stimulating Hormone: 1.2 u[IU]/mL (ref 0.400–5.000)

## 2013-02-15 LAB — T4, FREE: Thyroxine (Free): 0.8 ng/dL (ref 0.6–1.2)

## 2013-02-15 LAB — GLUCOSE SERUM, NONFASTING: Glucose: 85 mg/dL (ref 62–125)

## 2013-02-21 ENCOUNTER — Ambulatory Visit (INDEPENDENT_AMBULATORY_CARE_PROVIDER_SITE_OTHER): Payer: Self-pay | Admitting: Nurse Practitioner

## 2013-02-28 ENCOUNTER — Encounter (INDEPENDENT_AMBULATORY_CARE_PROVIDER_SITE_OTHER): Payer: Self-pay | Admitting: Nurse Practitioner

## 2013-02-28 ENCOUNTER — Ambulatory Visit (INDEPENDENT_AMBULATORY_CARE_PROVIDER_SITE_OTHER): Payer: PRIVATE HEALTH INSURANCE | Admitting: Nurse Practitioner

## 2013-02-28 VITALS — BP 120/73 | HR 103 | Temp 99.2°F | Resp 14 | Wt 135.7 lb

## 2013-02-28 MED ORDER — LISDEXAMFETAMINE DIMESYLATE 50 MG OR CAPS
50.0000 mg | ORAL_CAPSULE | Freq: Every day | ORAL | Status: DC
Start: 2013-02-28 — End: 2013-06-08

## 2013-02-28 MED ORDER — LISDEXAMFETAMINE DIMESYLATE 50 MG OR CAPS
50.0000 mg | ORAL_CAPSULE | Freq: Every day | ORAL | Status: DC
Start: 2013-04-30 — End: 2013-06-08

## 2013-02-28 MED ORDER — LISDEXAMFETAMINE DIMESYLATE 50 MG OR CAPS
50.0000 mg | ORAL_CAPSULE | Freq: Every day | ORAL | Status: DC
Start: 2013-03-30 — End: 2013-06-08

## 2013-02-28 NOTE — Patient Instructions (Signed)
It was a pleasure to see you in clinic today.     You can schedule an appointment to see us by calling [phone] or via eCare. If you are not yet signed up for eCare, please speak with a team member at the front desk.    eCare enrollment will allow you to make appointments online, view test results and obtain a copy of our After Visit Summary.     If labs were ordered today the results are expected to be available via eCare 5 days later. If viewing your results on eCare, please make sure to scroll all the way to the bottom of the results to view any messages written by your provider. If you have an active eCare account, this is where we will submit your results. If you wish to receive the results over the phone or via letter, please call our office directly at [phone] and leave a message for your provider.     Otherwise, result letters are mailed 7-14 days after your tests are completed. If your physician needs to change your care based on your results, you will receive a phone call to notify you. If you haven't heard from him/her and it has been more than 14 days please give us a call.     Medication refills: If you need to get a prescription refill please contact your pharmacy 1 week before your current supply will run out to request the refill. Contacting your pharmacy is the fastest and easiest way to obtain a medication refill. The pharmacy will notify our office. Please note that a minimum of 48-72 hours is needed to refill a medication. Please call your pharmacy early to allow enough time to refill before you anticipate running out.     We know that you have a choice in where you receive your healthcare and we sincerely thank you for choosing [facility].      Thank you,   Mannix Kroeker Del Rosario  Medical Assistant

## 2013-02-28 NOTE — Progress Notes (Signed)
Lisa Flynn is a 37 year old female who comes in today for a chief concern of ADD/ADHD follow-up.     Overall feels like the medication is not as effective as it used to be. Still really likes Vyvanse as a medication but feels like dose increase may be needed at this time.     Current Medication(s): Vyvanse 40 mg -taking it around 7:30 and wearing until 3:30. At work until 6 pm.   Overall Lisa Flynn is doing well, room for improvement  Sleep: no problems  Diet and weight: appetite stable.   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 Stress is actually lower for her.   Tics/SE: none    Has not started the patches and gum yet but trying to pick her quit date.     ROS:  Constitutional: Denies fever, chills, fatigue, malaise,  Cardiovascular: Denies chest pain, palpitations,  Respiratory: Denies cough, wheezing, shortness of breath  Neurological: Denies headaches, tremor, or weakness  Psychiatric: as above;     Review of patient's allergies indicates:  No Known Allergies    OBJECTIVE:  BP 120/73  Pulse 103  Temp(Src) 99.2 F (37.3 C) (Temporal)  Resp 14  Wt 135 lb 11.2 oz (61.553 kg)  BMI 22.94 kg/m2  SpO2 99%  LMP 03/11/2013  General appearance: healthy, alert, no distress   Lungs: clear to auscultation  Cardiac:  Auscultation:  Regular rate and rhythm.  S1 and S2 are normal.  No murmurs, gallops or rubs.  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   Plan:   1. Lisdexamfetamine Dimesylate (VYVANSE) 50 MG Oral Cap, Lisdexamfetamine Dimesylate (VYVANSE)50 MG Oral Cap, Lisdexamfetamine Dimesylate  (VYVANSE) 50 MG Oral Cap  2. Follow up 3 months, sooner as needed.    Continued to encourage smoking cessation which she is working towards.

## 2013-05-03 ENCOUNTER — Encounter (INDEPENDENT_AMBULATORY_CARE_PROVIDER_SITE_OTHER): Payer: Self-pay | Admitting: Nurse Practitioner

## 2013-06-08 ENCOUNTER — Encounter (INDEPENDENT_AMBULATORY_CARE_PROVIDER_SITE_OTHER): Payer: Self-pay | Admitting: Nurse Practitioner

## 2013-06-08 ENCOUNTER — Ambulatory Visit (INDEPENDENT_AMBULATORY_CARE_PROVIDER_SITE_OTHER): Payer: PRIVATE HEALTH INSURANCE | Admitting: Nurse Practitioner

## 2013-06-08 VITALS — BP 112/77 | HR 79 | Temp 98.2°F | Resp 16 | Wt 133.9 lb

## 2013-06-08 DIAGNOSIS — F988 Other specified behavioral and emotional disorders with onset usually occurring in childhood and adolescence: Secondary | ICD-10-CM

## 2013-06-08 MED ORDER — AMPHETAMINE-DEXTROAMPHETAMINE 10 MG OR TABS
ORAL_TABLET | ORAL | Status: DC
Start: 2013-06-08 — End: 2013-09-14

## 2013-06-08 MED ORDER — AMPHETAMINE-DEXTROAMPHETAMINE 10 MG OR TABS
ORAL_TABLET | ORAL | Status: DC
Start: 2013-07-06 — End: 2013-09-14

## 2013-06-08 MED ORDER — AMPHETAMINE-DEXTROAMPHETAMINE 10 MG OR TABS
ORAL_TABLET | ORAL | Status: DC
Start: 2013-08-06 — End: 2013-09-14

## 2013-06-08 MED ORDER — LISDEXAMFETAMINE DIMESYLATE 50 MG OR CAPS
50.0000 mg | ORAL_CAPSULE | Freq: Every day | ORAL | Status: DC
Start: 2013-07-06 — End: 2013-09-14

## 2013-06-08 MED ORDER — LISDEXAMFETAMINE DIMESYLATE 50 MG OR CAPS
50.0000 mg | ORAL_CAPSULE | Freq: Every day | ORAL | Status: DC
Start: 2013-08-06 — End: 2013-09-14

## 2013-06-08 MED ORDER — LISDEXAMFETAMINE DIMESYLATE 50 MG OR CAPS
50.0000 mg | ORAL_CAPSULE | Freq: Every day | ORAL | Status: DC
Start: 2013-06-08 — End: 2013-09-14

## 2013-06-08 NOTE — Progress Notes (Signed)
Lisa Flynn is a 38 year old female who comes in today for a chief concern of ADD/ADHD follow-up.     Found that the medication has been helpful and the 50 mg makes a difference during the day but still not lasting long enough in the afternoon. Wondering what other options there are.     Has been out of meds this week but has not had time to come in for a visit.     Current Medication(s): Vyvanse 50 mg   Overall Lisa Flynn is doing well  Sleep: no problems  Diet and weight: appetite stable  School/Work: work has actually been improved and less stressful recently  Tics/SE: none    ROS:  Constitutional: Denies fever, chills, fatigue, malaise,  Cardiovascular: Denies chest pain, palpitations,  Respiratory: Denies cough, wheezing, shortness of breath  Neurological: Denies headaches, tremor, or weakness  Psychiatric: as above;     Review of patient's allergies indicates:  No Known Allergies    OBJECTIVE:  BP 112/77  Pulse 79  Temp(Src) 98.2 F (36.8 C)  Resp 16  Wt 133 lb 14.4 oz (60.737 kg)  BMI 22.64 kg/m2  SpO2 100%  LMP 05/18/2013  General appearance: healthy, alert, no distress   Lungs: clear to auscultation  Cardiac:  Auscultation:  Regular rate and rhythm.  S1 and S2 are normal.  No murmurs, gallops or rubs.  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   Plan:   1. Lisdexamfetamine Dimesylate (VYVANSE) 50 MG Oral Cap, Lisdexamfetamine Dimesylate (VYVANSE)50 MG Oral Cap, Lisdexamfetamine Dimesylate (VYVANSE) 50 MG Oral Cap,  Amphetamine-Dextroamphetamine (ADDERALL) 10 MG Oral Tab, Amphetamine-Dextroamphetamine    (ADDERALL) 10 MG Oral Tab, Amphetamine-Dextroamphetamine (ADDERALL) 10 MG Oral Tab  2. Follow up 3 months, sooner as needed.

## 2013-09-14 ENCOUNTER — Ambulatory Visit (INDEPENDENT_AMBULATORY_CARE_PROVIDER_SITE_OTHER): Payer: PRIVATE HEALTH INSURANCE | Admitting: Family Medicine

## 2013-09-14 ENCOUNTER — Encounter (INDEPENDENT_AMBULATORY_CARE_PROVIDER_SITE_OTHER): Payer: Self-pay | Admitting: Family Medicine

## 2013-09-14 VITALS — BP 104/64 | HR 70 | Temp 97.4°F | Resp 12 | Wt 131.0 lb

## 2013-09-14 DIAGNOSIS — F988 Other specified behavioral and emotional disorders with onset usually occurring in childhood and adolescence: Secondary | ICD-10-CM

## 2013-09-14 MED ORDER — LISDEXAMFETAMINE DIMESYLATE 50 MG OR CAPS
50.0000 mg | ORAL_CAPSULE | Freq: Every day | ORAL | Status: DC
Start: 2013-09-14 — End: 2014-01-04

## 2013-09-14 MED ORDER — AMPHETAMINE-DEXTROAMPHETAMINE 10 MG OR TABS
ORAL_TABLET | ORAL | Status: DC
Start: 2013-09-14 — End: 2014-01-04

## 2013-09-14 MED ORDER — AMPHETAMINE-DEXTROAMPHETAMINE 10 MG OR TABS
ORAL_TABLET | ORAL | Status: DC
Start: 2013-11-14 — End: 2014-01-04

## 2013-09-14 MED ORDER — LISDEXAMFETAMINE DIMESYLATE 50 MG OR CAPS
50.0000 mg | ORAL_CAPSULE | Freq: Every day | ORAL | Status: DC
Start: 2013-11-14 — End: 2014-01-04

## 2013-09-14 MED ORDER — LISDEXAMFETAMINE DIMESYLATE 50 MG OR CAPS
50.0000 mg | ORAL_CAPSULE | Freq: Every day | ORAL | Status: DC
Start: 2013-10-14 — End: 2014-01-04

## 2013-09-14 MED ORDER — AMPHETAMINE-DEXTROAMPHETAMINE 10 MG OR TABS
ORAL_TABLET | ORAL | Status: DC
Start: 2013-10-14 — End: 2014-01-04

## 2013-09-14 NOTE — Progress Notes (Signed)
10:33 AM  Lisa Flynn is a 38 year old year old female with a history of ADD who presents to clinic today for the following:    1.  ADD:  Here for refill on ADD medications.  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  Does need her short acting medication most days.       ROS: denies chest pain, palpitations, unintentional weight loss, insomnia    (reviewed and updated today):  Patient Active Problem List   Diagnosis   . ALOPECIA AREATA   . TOBACCO USE DISORDER   . HEADACHE   . PRESENCE OF IUD   . CONTROLLED SUBSTANCES PLAN ON FILE   . ATTN DEFICIT NONHYPERACT   . 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   Plan: Amphetamine-Dextroamphetamine (ADDERALL) 10 MG         Oral Tab, Amphetamine-Dextroamphetamine         (ADDERALL) 10 MG Oral Tab,         Amphetamine-Dextroamphetamine (ADDERALL) 10 MG         Oral Tab, Lisdexamfetamine Dimesylate (VYVANSE)        50 MG Oral Cap, Lisdexamfetamine Dimesylate         (VYVANSE) 50 MG Oral Cap, Lisdexamfetamine         Dimesylate (VYVANSE) 50 MG Oral Cap

## 2014-01-04 ENCOUNTER — Ambulatory Visit (INDEPENDENT_AMBULATORY_CARE_PROVIDER_SITE_OTHER): Payer: PRIVATE HEALTH INSURANCE | Admitting: Family Medicine

## 2014-01-04 ENCOUNTER — Encounter (INDEPENDENT_AMBULATORY_CARE_PROVIDER_SITE_OTHER): Payer: Self-pay | Admitting: Family Medicine

## 2014-01-04 VITALS — BP 118/82 | HR 85 | Temp 97.0°F | Resp 12 | Wt 134.0 lb

## 2014-01-04 DIAGNOSIS — F988 Other specified behavioral and emotional disorders with onset usually occurring in childhood and adolescence: Secondary | ICD-10-CM

## 2014-01-04 DIAGNOSIS — F17200 Nicotine dependence, unspecified, uncomplicated: Secondary | ICD-10-CM

## 2014-01-04 MED ORDER — LISDEXAMFETAMINE DIMESYLATE 50 MG OR CAPS
50.0000 mg | ORAL_CAPSULE | Freq: Every day | ORAL | Status: DC
Start: 2014-01-04 — End: 2014-03-20

## 2014-01-04 MED ORDER — LISDEXAMFETAMINE DIMESYLATE 50 MG OR CAPS
50.0000 mg | ORAL_CAPSULE | Freq: Every day | ORAL | Status: DC
Start: 2014-03-06 — End: 2014-03-20

## 2014-01-04 MED ORDER — AMPHETAMINE-DEXTROAMPHETAMINE 10 MG OR TABS
ORAL_TABLET | ORAL | Status: DC
Start: 2014-02-03 — End: 2014-03-20

## 2014-01-04 MED ORDER — AMPHETAMINE-DEXTROAMPHETAMINE 10 MG OR TABS
ORAL_TABLET | ORAL | Status: DC
Start: 2014-01-04 — End: 2014-03-20

## 2014-01-04 MED ORDER — AMPHETAMINE-DEXTROAMPHETAMINE 10 MG OR TABS
ORAL_TABLET | ORAL | Status: DC
Start: 2014-03-06 — End: 2014-03-20

## 2014-01-04 MED ORDER — LISDEXAMFETAMINE DIMESYLATE 50 MG OR CAPS
50.0000 mg | ORAL_CAPSULE | Freq: Every day | ORAL | Status: DC
Start: 2014-02-03 — End: 2014-03-20

## 2014-01-04 NOTE — Progress Notes (Signed)
8:56 AM  Ortencia is a 38 year old year old female with a history of ADD who presents to clinic today for the following:    1.  ADD:  Continues to be happy with medication and doses.  Has been out of medication for several days and has noticed that she is very inefficient at work.      ROS:  Denies chest pain, palpitations, weight changes, anorexia, insomnia.   Contemplating quitting smoking, thinking about attending classes at her church     (reviewed and updated today):  Patient Active Problem List   Diagnosis   . ALOPECIA AREATA   . TOBACCO USE DISORDER   . HEADACHE   . PRESENCE OF IUD   . CONTROLLED SUBSTANCES PLAN ON FILE   . ATTN DEFICIT NONHYPERACT   . Subclinical hyperthyroidism     meds reviewed as below    O: VS BP 118/82 mmHg  Pulse 85  Temp(Src) 97 F (36.1 C) (Temporal)  Resp 12  Wt 134 lb (60.782 kg)  LMP 12/21/2013   Wt Readings from Last 3 Encounters:   01/04/14 134 lb (60.782 kg)   09/14/13 131 lb (59.421 kg)   06/08/13 133 lb 14.4 oz (60.737 kg)   General appearance: healthy, alert, no distress. 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 Refilled meds as below.   Plan: Amphetamine-Dextroamphetamine (ADDERALL) 10 MG         Oral Tab, Amphetamine-Dextroamphetamine         (ADDERALL) 10 MG Oral Tab,         Amphetamine-Dextroamphetamine (ADDERALL) 10 MG         Oral Tab, Lisdexamfetamine Dimesylate (VYVANSE)        50 MG Oral Cap, Lisdexamfetamine Dimesylate         (VYVANSE) 50 MG Oral Cap, Lisdexamfetamine         Dimesylate (VYVANSE) 50 MG Oral Cap    (305.1) Tobacco use disorder: strongly encouraged quitting. Pt contemplative and will contact use if needing assistance.

## 2014-01-04 NOTE — Progress Notes (Signed)
Reason for visit: Med Check      Cervical screening/PAP:No  Mammo: No  Colon Screen: No  Have you seen a specialist since your last visit: NO          HM Due:   Health Maintenance   Topic Date Due   . INFLUENZA VACCINE (1) 12/11/2013   . PAP 3 YEAR W/IB MESSAGE  01/24/2014   . TETANUS BOOSTER  01/18/2022              No future appointments.

## 2014-01-31 ENCOUNTER — Encounter (INDEPENDENT_AMBULATORY_CARE_PROVIDER_SITE_OTHER): Payer: Self-pay | Admitting: Family Medicine

## 2014-01-31 NOTE — Telephone Encounter (Signed)
Routing to provider for review.  Rayvon Char, CMA

## 2014-01-31 NOTE — Telephone Encounter (Signed)
Please open Lisa Flynn's in her children's accounts and I can send a rx.

## 2014-02-01 NOTE — Telephone Encounter (Signed)
eCare message sent to patient's mother advising her to get proxy access to her children's profiles if she has any further medical questions/concerns regarding their care    Per request of PCP, opened TEs under their names

## 2014-02-07 ENCOUNTER — Other Ambulatory Visit: Payer: Self-pay

## 2014-03-19 ENCOUNTER — Telehealth (INDEPENDENT_AMBULATORY_CARE_PROVIDER_SITE_OTHER): Payer: Self-pay | Admitting: Family Medicine

## 2014-03-19 NOTE — Telephone Encounter (Signed)
CONFIRMED PHONE NUMBER: [phone]  CALLERS FIRST AND LAST NAME: [name]  FACILITY NAME: N/A TITLE: N/A  CALLERS RELATIONSHIP:Self  RETURN CALL: OK to leave detailed message with anyone that answers     SUBJECT: General Message   REASON FOR REQUEST: Medical advice    MESSAGE: Patient has a wellness exam scheduled for 03/20/2014; patient states that her menstrual period just started and wants to know if that will affect her wellness exam or not.  Please call the patient back to advise on this issue. Thank you.

## 2014-03-19 NOTE — Telephone Encounter (Addendum)
HPV test was negative in 2009 and 2012, so we can do paps every 5 years, so okay to keep her appt and not do a pelvic exam this year.  Ecare sent.

## 2014-03-20 ENCOUNTER — Encounter (INDEPENDENT_AMBULATORY_CARE_PROVIDER_SITE_OTHER): Payer: Self-pay | Admitting: Family Medicine

## 2014-03-20 ENCOUNTER — Ambulatory Visit (INDEPENDENT_AMBULATORY_CARE_PROVIDER_SITE_OTHER): Payer: PRIVATE HEALTH INSURANCE | Admitting: Family Medicine

## 2014-03-20 VITALS — BP 115/77 | HR 78 | Temp 98.5°F | Resp 12 | Ht 64.75 in | Wt 131.0 lb

## 2014-03-20 DIAGNOSIS — F988 Other specified behavioral and emotional disorders with onset usually occurring in childhood and adolescence: Secondary | ICD-10-CM

## 2014-03-20 DIAGNOSIS — N6011 Diffuse cystic mastopathy of right breast: Secondary | ICD-10-CM

## 2014-03-20 DIAGNOSIS — Z0000 Encounter for general adult medical examination without abnormal findings: Secondary | ICD-10-CM

## 2014-03-20 DIAGNOSIS — N6012 Diffuse cystic mastopathy of left breast: Secondary | ICD-10-CM

## 2014-03-20 DIAGNOSIS — F900 Attention-deficit hyperactivity disorder, predominantly inattentive type: Secondary | ICD-10-CM

## 2014-03-20 LAB — GLUCOSE, FASTING: Glucose, Fasting: 90 mg/dL (ref 62–125)

## 2014-03-20 LAB — LIPID PANEL
Cholesterol (LDL): 78 mg/dL (ref <130)
Cholesterol/HDL Ratio: 2
HDL Cholesterol: 94 mg/dL (ref >39)
Non-HDL Cholesterol: 91 mg/dL (ref 0–159)
Total Cholesterol: 185 mg/dL (ref <200)
Triglyceride: 64 mg/dL (ref <150)

## 2014-03-20 MED ORDER — AMPHETAMINE-DEXTROAMPHETAMINE 10 MG OR TABS
ORAL_TABLET | ORAL | Status: DC
Start: 2014-05-04 — End: 2014-07-18

## 2014-03-20 MED ORDER — AMPHETAMINE-DEXTROAMPHETAMINE 10 MG OR TABS
ORAL_TABLET | ORAL | Status: DC
Start: 2014-04-03 — End: 2014-07-18

## 2014-03-20 MED ORDER — LISDEXAMFETAMINE DIMESYLATE 50 MG OR CAPS
50.0000 mg | ORAL_CAPSULE | Freq: Every day | ORAL | Status: DC
Start: 2014-04-03 — End: 2014-07-18

## 2014-03-20 MED ORDER — AMPHETAMINE-DEXTROAMPHETAMINE 10 MG OR TABS
ORAL_TABLET | ORAL | Status: DC
Start: 2014-06-04 — End: 2014-07-18

## 2014-03-20 MED ORDER — LISDEXAMFETAMINE DIMESYLATE 50 MG OR CAPS
50.0000 mg | ORAL_CAPSULE | Freq: Every day | ORAL | Status: DC
Start: 2014-05-04 — End: 2014-07-18

## 2014-03-20 MED ORDER — LISDEXAMFETAMINE DIMESYLATE 50 MG OR CAPS
50.0000 mg | ORAL_CAPSULE | Freq: Every day | ORAL | Status: DC
Start: 2014-06-04 — End: 2014-07-18

## 2014-03-20 NOTE — Patient Instructions (Signed)
Recommendations for Pap Smear screening age 38-65    . If you have had three normal Pap test results in a row and no new partners, pap smear every 3 years OR pap smear and HPV screening every 5 years  o Exceptions:   - if you have ever been treated for moderate or severe dysplasia of the cervix and have completed post-treatment surveillance, you can return to routine screening for your age group, and continue Pap screening for at least twenty years.  - If you are HIV positive you should have a pap smear every six months for a year after diagnosis and then every year.  - If you have a suppressed immune system you should have a pap yearly  . Discontinue screening at age 65 if there have been three consecutive normal Pap smears and no abnormal in the past ten years with the most recent testing in the last 5 years  . If you are sexually active, you should have a yearly screening for Chlamydia, HIV, and other sexually transmitted infections as appropriate  . If you are using contraception, you should see your provider yearly for symptom review, blood pressure check (if using a hormonal method), and exam if needed.  Leading a Healthy Life  Six tips to help improve your health and wellness     This explains how these 6 basic guidelines may improve your health and wellness:   Eat well to give your body the energy it needs.   Stay or get active.   A healthy mind is part of a healthy body.   Practice safe living habits.   Keep your mind and body free of harmful drugs and alcohol.   Get regular health care.     Tip #1: Eat well to give your body the energy it needs.   Your body needs nutritious foods to stay strong and healthy.   Here are some general eating guidelines:   Have 2 servings of fish or other seafood 2 times a week (1 serving = 4 ounces).   If you eat dairy products, choose low-fat (1%) or nonfat ones.   If you eat meat, cut down on the amount. Replace it with plant-based foods such as beans, whole grains, fruits  and vegetables, and nuts and seeds.   Have less than 1,500 mg of sodium (salt) a day.   Cut down on "junk food" like alcohol, fatty foods, chips, candy, and other sweets.     Tip #2: Stay or get active.   Exercise for at least 30 minutes at a time, 3 times a week. Regular physical activity can help you:   Live longer and feel better   Be stronger and more flexible   Build strong bones   Prevent depression   Strengthen your immune system   Maintain a healthy body weight     Tip #3: Remember: A healthy mind is part of a healthy body.   A good state of mind can help you make healthy choices. Here are a few tips for keeping your mind healthy:   Reduce stress in your life.   Make some time every day for things that are fun.   Get enough sleep. Lack of sleep reduces how well you can concentrate, increases mood swings, and raises your risk of having a car accident.   Ask your health care provider for help if you feel depressed or anxious for more than several days in a row.     Tip #  4: Practice safe living habits.   Accidents and Injuries     Accidents and injuries are the 5th leading cause of death in the [place]   Women under age 35 are more likely to die in motor vehicle accidents than from any other cause.   Accidents in the home cause thousands of permanent injuries every year.     The most common accidents are fires, falls, and drowning. To help yourself and your family stay safe:   Install smoke detectors on each floor of your home.   Make sure everyone in your family knows how to swim  Stay safe on the road:  Wear a seatbelt.   Do not ride with someone who has been drinking or taking drugs.   Do not speak on a cell phone or send, read, or write text messages while you are driving.   Wear a helmet when you ride a bicycle or motorcycle.   Get enough sleep at night, and do not drive when you are tired.     Hand Hygiene   Protect yourself from germs by washing your hands often. Always wash your hands:   After you change a  diaper or use the toilet   Before you start and after you finish preparing food     Tip #5: Keep your mind and body free of harmful drugs and alcohol.   Tobacco causes more health problems than any other substance. These problems include lung disease, heart disease, and many types of cancer. The nicotine in tobacco is the most addictive and widely used drug.   Too much alcohol can cause damage to your liver, heart, brain, bones, and other body tissues. Being under the influence of alcohol also increases your chance of being injured in an accident.    Alcohol can cause fetal alcohol syndrome in your children if you drink regularly when you are pregnant.   Street drugs, like marijuana, cocaine, methamphetamine, heroin, or pain pills not prescribed by your doctor can harm your health. They may be mixed with harmful substances, and using them can cause people to put themselves in dangerous situations.     Tip #6: Get regular health care.   Many people think they need to see the doctor only when they are sick. But, health care providers can also help you stay healthy.   Find a health care provider who works with you to manage your health.   Ask your health care provider what diseases you are at risk for. Learn what you can do to prevent or control them.   Get yourself and your family immunized against life-threatening diseases.

## 2014-03-20 NOTE — Telephone Encounter (Signed)
Patient returning call from clinic.  Please call [phone], detailed message on VM OK.

## 2014-03-20 NOTE — Telephone Encounter (Signed)
Last Read in eCare   03/20/2014 9:03 AM by [name]     Nothing further.

## 2014-03-20 NOTE — Progress Notes (Signed)
Lisa Flynn is a 38 year old female here today for a preventive health visit.   Other problems or concerns today: none    GYN HISTORY  Obstetric History    G1   P1   T1   P0   A0   TAB0   SAB0   E0   M0   L1      Patient's last menstrual period was 03/19/2014.   Periods are regular but every 3 weeks  Dysmenorrhea: none  Cyclic symptoms: none  Intermenstrual bleeding, spotting,or discharge: No   Last pap: 2012  Pap history: no history of abnormal paps  Contraception: yes:  IUD:  PARAGARD T380A  Other gyn history: none    SEXUAL HISTORY  Sexual activity: not currently  Age at first intercourse: Not asked today  History of STD: none known  Number of sex partners in the past year: 0  Last STD check: not asked    Chlamydia screening offered: declined  HIV screening offered: declined  HPV vaccine offered: No  Sexual concerns: No  History of sexual or physical abuse: Not asked today  Have you been hit, kicked, punched, or otherwise hurt by someone within the past year:Not asked today    CANCER SCREENING  Family history of colon cancer: NO  Family history of uterine or ovarian cancer: NO  Family history of breast cancer: NO  Prior mammogram: NO  History of abnormal mammogram: YES    LIFESTYLE  Current dietary habits: healthy diet in general  Calcium: dietary sources only  Current exercise habits: yes, engages in regular exercise  Regular seat belt use: N/A  Substance use:  reports that she has been smoking Cigarettes.  She has been smoking about 0.00 packs per day for the past 15 years. She does not have any smokeless tobacco history on file. She reports that she does not drink alcohol.  Stress: No  Exposure to hazardous materials: Not asked today  Guns in the house: Not asked today    History sections of chart reviewed and updated today: YES    Review Of Systems  Weight: stable  Constitutional: denies unusual fatigue, fever, chills, sweats  Regular dental exams: yes  CV:  Denies chest pain,  palpitations.  Lungs: no cough, wheezing or shortness of breath  GI: Denies constipation, diarrhea, or blood in stools; denies nausea, dysphagia, heartburn or other abd. pain  GU: denies abnormal vaginal bleeding, discharge or unusual pelvic pain, denies dysuria, frequency or gross hematuria  Endocrine: borderline thyroid testing in the past.  no known diabetes  Skin: stable alopecia.  denies rash or other active skin concerns  Psych: Depression symptoms: none.  Other: none.  ADD symptoms well controlled on medications, no notable side effects     EXAM:  BP 115/77 mmHg  Pulse 78  Temp(Src) 98.5 F (36.9 C) (Temporal)  Resp 12  Ht 5' 4.75" (1.645 m)  Wt 131 lb (59.421 kg)  BMI 21.96 kg/m2  LMP 03/19/2014  Body mass index is 21.96 kg/(m^2).  General appearance: healthy, alert, no distress  Skin: wearing a wig.   Neck: supple. No adenopathy. Thyroid symmetric, normal size, without nodules  Breasts:diffuse fibrocystic changes without dominant mass. No obvious deformity or mass to inspection, nipples everted bilaterally, no skin lesion or nipple discharge, no mass palpated, no axillary lymphadenopathy  Lungs: clear to auscultation and no cough nor wheeze with forced inspiration/expiration  Heart: normal rate, regular rhythm and no murmurs, clicks, or gallops  Abdomen:  soft, non-tender. BS normal. No masses or organomegaly  Pelvic: deferred   Rectal: deferred    ASSESSMENT/PLAN:  Health Maintenance   Topic Date Due   . INFLUENZA VACCINE (1) 12/11/2013   . PAP 5 YEAR W/ IB MESSAGE  01/25/2016   . TETANUS BOOSTER  01/18/2022   . HIV SCREENING  Completed     Immunizations or studies due: Yes, see orders     (Z00.00) Routine medical exam  (primary encounter diagnosis): routine advice as below, in particular keep working a tobacco cessation.  F/u in 1 year        (Z00.00) Laboratory exam ordered as part of routine general medical examination:    Plan: LIPID PANEL, GLUCOSE SERUM, FASTING, THYROID         STIMULATING  HORMONE, T4, FREE          (F90.0) Attention deficit disorder: stable, refilled meds as below.  F/u in 3 months    Plan: Amphetamine-Dextroamphetamine (ADDERALL) 10 MG         Oral Tab, Amphetamine-Dextroamphetamine         (ADDERALL) 10 MG Oral Tab,         Amphetamine-Dextroamphetamine (ADDERALL) 10 MG         Oral Tab, Lisdexamfetamine Dimesylate (VYVANSE)        50 MG Oral Cap, Lisdexamfetamine Dimesylate         (VYVANSE) 50 MG Oral Cap, Lisdexamfetamine         Dimesylate (VYVANSE) 50 MG Oral Cap    (N60.11,  N60.12) Fibrocystic breast changes of both breasts: discussed pros and cons of different breast CA screening guidelines, recommended yearly mammogram starting age 56, or diagnostic mammogram if new lump.         Preventive counseling: contraception  STD prevention  breast self-exam  immunizations  dental care  low-fat high-fiber diet  adequate calcium intake  proper exercise  Follow-up: 1 year  Lab tests and results of any diagnostic studies will be shared with the patient by  eCare

## 2014-03-21 LAB — THYROID STIMULATING HORMONE: Thyroid Stimulating Hormone: 1.711 u[IU]/mL (ref 0.400–5.000)

## 2014-03-21 LAB — T4, FREE: Thyroxine (Free): 0.8 ng/dL (ref 0.6–1.2)

## 2014-03-21 NOTE — Progress Notes (Signed)
Quick Note:    ecare 03/21/2014  ______

## 2014-07-18 ENCOUNTER — Encounter (INDEPENDENT_AMBULATORY_CARE_PROVIDER_SITE_OTHER): Payer: Self-pay | Admitting: Family Medicine

## 2014-07-18 ENCOUNTER — Ambulatory Visit (INDEPENDENT_AMBULATORY_CARE_PROVIDER_SITE_OTHER): Payer: PRIVATE HEALTH INSURANCE | Admitting: Family Medicine

## 2014-07-18 VITALS — BP 110/73 | HR 90 | Temp 98.6°F | Resp 14 | Wt 129.0 lb

## 2014-07-18 DIAGNOSIS — L639 Alopecia areata, unspecified: Secondary | ICD-10-CM

## 2014-07-18 DIAGNOSIS — F988 Other specified behavioral and emotional disorders with onset usually occurring in childhood and adolescence: Secondary | ICD-10-CM

## 2014-07-18 DIAGNOSIS — F900 Attention-deficit hyperactivity disorder, predominantly inattentive type: Secondary | ICD-10-CM

## 2014-07-18 MED ORDER — LISDEXAMFETAMINE DIMESYLATE 50 MG OR CAPS
50.0000 mg | ORAL_CAPSULE | Freq: Every day | ORAL | Status: DC
Start: 2014-09-17 — End: 2014-10-10

## 2014-07-18 MED ORDER — AMPHETAMINE-DEXTROAMPHETAMINE 10 MG OR TABS
ORAL_TABLET | ORAL | Status: DC
Start: 2014-09-17 — End: 2014-10-10

## 2014-07-18 MED ORDER — AMPHETAMINE-DEXTROAMPHETAMINE 10 MG OR TABS
ORAL_TABLET | ORAL | Status: DC
Start: 2014-07-18 — End: 2014-10-10

## 2014-07-18 MED ORDER — LISDEXAMFETAMINE DIMESYLATE 50 MG OR CAPS
50.0000 mg | ORAL_CAPSULE | Freq: Every day | ORAL | Status: DC
Start: 2014-07-18 — End: 2014-10-10

## 2014-07-18 MED ORDER — LISDEXAMFETAMINE DIMESYLATE 50 MG OR CAPS
50.0000 mg | ORAL_CAPSULE | Freq: Every day | ORAL | Status: DC
Start: 2014-08-17 — End: 2014-10-10

## 2014-07-18 MED ORDER — AMPHETAMINE-DEXTROAMPHETAMINE 10 MG OR TABS
ORAL_TABLET | ORAL | Status: DC
Start: 2014-08-17 — End: 2014-10-10

## 2014-07-18 NOTE — Progress Notes (Signed)
Lisa Flynn is a 39 year old year old female with a history of ADD who presents to clinic today for the following:    1.  ADD:  Requesting refills on ADD medications.  Happy with doses, feels well-focused. No side effects.  Notes that her weight has decreased slightly, but denies any low appetite.  Often does not eat breakfast.      Requesting DME for wig for her alopecia.    (reviewed and updated today):  Patient Active Problem List   Diagnosis   . ALOPECIA AREATA   . TOBACCO USE DISORDER   . HEADACHE   . PRESENCE OF IUD   . CONTROLLED SUBSTANCES PLAN ON FILE   . ATTN DEFICIT NONHYPERACT   . Subclinical hyperthyroidism     meds reviewed as below    O: VS BP 110/73 mmHg  Pulse 90  Temp(Src) 98.6 F (37 C) (Temporal)  Resp 14  Wt 129 lb (58.514 kg)  LMP 07/18/2014  Breastfeeding? No   Wt Readings from Last 3 Encounters:   07/18/14 129 lb (58.514 kg)   03/20/14 131 lb (59.421 kg)   01/04/14 134 lb (60.782 kg)   General appearance: healthy, alert, no distres    A/P:  (F90.0) Attention deficit disorder  (primary encounter diagnosis): encouraged eating something for breakfast daily.  Doing well, refilled at current doses.  May call for refills in 3 month if doing well, but would need to confirm weight stabilized.    Plan: Amphetamine-Dextroamphetamine (ADDERALL) 10 MG         Oral Tab, Amphetamine-Dextroamphetamine         (ADDERALL) 10 MG Oral Tab,         Amphetamine-Dextroamphetamine (ADDERALL) 10 MG         Oral Tab, Lisdexamfetamine Dimesylate (VYVANSE)        50 MG Oral Cap, Lisdexamfetamine Dimesylate         (VYVANSE) 50 MG Oral Cap, Lisdexamfetamine         Dimesylate (VYVANSE) 50 MG Oral Cap    (L63.9) Alopecia areata:  DME for wig written.    Plan: DME

## 2014-07-18 NOTE — Progress Notes (Signed)
Reason for visit: Med check      Cervical screening/PAP:no  Mammo: no  Colon Screen: no  Have you seen a specialist since your last visit: NO        HM Due:   Health Maintenance   Topic Date Due   . Pap Smear  01/25/2016   . Tetanus Vaccine  01/18/2022   . Influenza Vaccine  Completed   . HIV Screen  Completed              No future appointments.

## 2014-07-18 NOTE — Patient Instructions (Signed)
It was a pleasure to see you in clinic today. Your Medical Assistant was:  Laren Boom can schedule an appointment to see [place] by calling  [phone] or via eCare.     If labs were ordered today the results are expected to be available via eCare 5 days later. Otherwise, result letters are mailed 7-10 days after your tests are completed. If your physician needs to change your care based on your results, you will receive a phone call to notify you. If you haven't heard from him/her and it has been more than 10 days please give [place] a call.     If you are not yet signed up for eCare, please speak with a team member at the front desk. eCare enrollment will allow you to make appointments online, view test results and obtain a copy of our After Visit Summary    Thank you for choosing [facility].

## 2014-10-10 ENCOUNTER — Other Ambulatory Visit (INDEPENDENT_AMBULATORY_CARE_PROVIDER_SITE_OTHER): Payer: Self-pay | Admitting: Family Medicine

## 2014-10-10 DIAGNOSIS — F988 Other specified behavioral and emotional disorders with onset usually occurring in childhood and adolescence: Secondary | ICD-10-CM

## 2014-10-10 MED ORDER — AMPHETAMINE-DEXTROAMPHETAMINE 10 MG OR TABS
ORAL_TABLET | ORAL | Status: DC
Start: 2014-10-17 — End: 2015-03-12

## 2014-10-10 MED ORDER — AMPHETAMINE-DEXTROAMPHETAMINE 10 MG OR TABS
ORAL_TABLET | ORAL | Status: DC
Start: 2014-11-17 — End: 2015-03-12

## 2014-10-10 MED ORDER — AMPHETAMINE-DEXTROAMPHETAMINE 10 MG OR TABS
ORAL_TABLET | ORAL | Status: DC
Start: 2014-12-18 — End: 2015-02-11

## 2014-10-10 MED ORDER — LISDEXAMFETAMINE DIMESYLATE 50 MG OR CAPS
50.0000 mg | ORAL_CAPSULE | Freq: Every day | ORAL | Status: DC
Start: 2014-10-17 — End: 2015-02-11

## 2014-10-10 MED ORDER — LISDEXAMFETAMINE DIMESYLATE 50 MG OR CAPS
50.0000 mg | ORAL_CAPSULE | Freq: Every day | ORAL | Status: DC
Start: 2014-11-17 — End: 2015-02-11

## 2014-10-10 MED ORDER — LISDEXAMFETAMINE DIMESYLATE 50 MG OR CAPS
50.0000 mg | ORAL_CAPSULE | Freq: Every day | ORAL | Status: DC
Start: 2014-12-18 — End: 2015-02-11

## 2014-10-10 NOTE — Telephone Encounter (Signed)
Rx's placed in pt pick up.  Pt has been notified via e Care.   No further action needed

## 2014-10-10 NOTE — Telephone Encounter (Signed)
Medication last prescribed 09/17/14 pt last seen 07/18/14

## 2014-10-10 NOTE — Telephone Encounter (Signed)
Approved and printed at [facility], please place at front desk.

## 2015-02-10 ENCOUNTER — Encounter (INDEPENDENT_AMBULATORY_CARE_PROVIDER_SITE_OTHER): Payer: PRIVATE HEALTH INSURANCE | Admitting: Medical

## 2015-02-11 ENCOUNTER — Encounter (INDEPENDENT_AMBULATORY_CARE_PROVIDER_SITE_OTHER): Payer: Self-pay | Admitting: Medical

## 2015-02-11 ENCOUNTER — Ambulatory Visit (INDEPENDENT_AMBULATORY_CARE_PROVIDER_SITE_OTHER): Payer: PRIVATE HEALTH INSURANCE | Admitting: Medical

## 2015-02-11 VITALS — BP 119/79 | HR 108 | Temp 98.6°F | Resp 14 | Wt 126.0 lb

## 2015-02-11 DIAGNOSIS — F988 Other specified behavioral and emotional disorders with onset usually occurring in childhood and adolescence: Secondary | ICD-10-CM

## 2015-02-11 MED ORDER — AMPHETAMINE-DEXTROAMPHETAMINE 10 MG OR TABS
ORAL_TABLET | ORAL | Status: DC
Start: 2015-02-11 — End: 2015-03-12

## 2015-02-11 MED ORDER — LISDEXAMFETAMINE DIMESYLATE 60 MG OR CAPS
60.0000 mg | ORAL_CAPSULE | Freq: Every morning | ORAL | Status: DC
Start: 2015-02-11 — End: 2015-03-12

## 2015-02-11 NOTE — Progress Notes (Signed)
Lisa Flynn is a 39 year old female who comes in today for a chief concern of ADD/ADHD follow-up.       Current Medication(s): Vyvanse 50 mgs in the am and Adderall 5 mg- 10 mgs in the afternoon   Overall Lisa Flynn feels her medication is not as helpful   She is less productive at work and has to complete her work at home   She also finds it difficult to problem solve   Sleep: normal   Diet and weight: says she feels weight is stable   Work: decreased productivity   Tics/SE: none     Review of patient's allergies indicates:  No Known Allergies    OBJECTIVE:  BP 119/79 mmHg  Pulse 108  Temp(Src) 98.6 F (37 C) (Temporal)  Resp 14  Wt 126 lb (57.153 kg)  SpO2 100%  LMP 01/28/2015  General appearance: healthy, alert, no distress   Neuro: Alert interactive and appropriate for age  Normal mood and affect     A/P:     1. Can try a higher dose, hopefully this will address her concerns about decrease in productivity   2. monitor weight, overall she is down 5 pounds in the past 11 months   2. Follow up 1 month, sooner as needed.

## 2015-02-11 NOTE — Progress Notes (Signed)
Reason for Visit: See chief complaint      Refills? YES  Referral? NO  Letter or Form? NO  Lab Results? NO    HEALTH MAINTENANCE:  Has the patient has this done since their last visit?  Cervical screening/PAP: 01-25-11  Mammo: N/A  Colon Screen: N/A  Diabetic Eye Exam (If applicable): N/A      Have you seen a specialist since your last visit: No    Vaccines Due? Yes, flu    PHQ2 done in the last year (365 days)? yes    Does patient have eCare?  yes    HM Due:   Health Maintenance   Topic Date Due   . Influenza Vaccine (1) 12/12/2014   . Pap Smear  01/25/2016   . Tetanus Vaccine  01/18/2022   . HIV Screen  Completed       PCP Verified?  Yes, Debelak

## 2015-02-12 ENCOUNTER — Ambulatory Visit (INDEPENDENT_AMBULATORY_CARE_PROVIDER_SITE_OTHER): Payer: PRIVATE HEALTH INSURANCE

## 2015-02-12 DIAGNOSIS — Z23 Encounter for immunization: Secondary | ICD-10-CM

## 2015-02-12 NOTE — Progress Notes (Signed)
Vaccine Screening Questions      1.  Have you had a serious reaction or an allergic reaction to a vaccine?  NO    2.  Currently have a moderate or severe illness, including fever? (Don't Ask if vaccine   ordered by provider same day)  NO    3.  Ever had a seizure or a brain or other nervous system problem syndrome associated with a vaccine? (DTaP/TDaP/DTP pertinent) NO    4.  Is patient receiving  any live vaccinations today? (Varicella-Chickenpox, MMR-Measles/Mumps/Rubella, Zoster-Shingles)  NO    If YES to any of the questions above - Do NOT give vaccine.  Consult with RN or provider in clinic.  (#4 can be YES if all Live vaccine questions are answered NO)    If NO to all questions above - Patient may receive vaccine.    5.  Do you need to receive the Flu vaccine today? YES - Additional Flu Questions  Flu Vaccine Screening Questions:    Ever had a serious allergic reaction to eggs?  NO    Ever had Guillain-Barre syndrome associated with a vaccine? NO    Less than 6 months old? NO    If YES to any of the Flu questions above - NO Flu Vaccine to be given.  Patient may consult provider as needed.    If NO to all questions above - Patient may receive Flu Shot (IM)    If between 6 months and 8 years of age, was flu vaccine received last year?  N/A  If NO to above question:  . Children who are receiving influenza vaccine for the first time - administer 2 doses of the current influenza vaccine (separated by at least 4 weeks).        Vaccine information sheet(s) discussed, patient/parent/guardian verbalized understanding? YES     VIS given 02/12/2015 by Kimberly A Williams MA.      Immunization Documentation:    Patient/Parent has reviewed the appropriate VIS and has all questions answered? YES    Vaccine(s) given today without initial adverse effect? YES    Kim Williams, CMA

## 2015-03-12 ENCOUNTER — Encounter (INDEPENDENT_AMBULATORY_CARE_PROVIDER_SITE_OTHER): Payer: Self-pay | Admitting: Medical

## 2015-03-12 ENCOUNTER — Ambulatory Visit (INDEPENDENT_AMBULATORY_CARE_PROVIDER_SITE_OTHER): Payer: PRIVATE HEALTH INSURANCE | Admitting: Medical

## 2015-03-12 VITALS — BP 116/76 | HR 103 | Temp 99.8°F | Resp 12 | Wt 126.0 lb

## 2015-03-12 DIAGNOSIS — F988 Other specified behavioral and emotional disorders with onset usually occurring in childhood and adolescence: Secondary | ICD-10-CM

## 2015-03-12 MED ORDER — LISDEXAMFETAMINE DIMESYLATE 60 MG OR CAPS
60.0000 mg | ORAL_CAPSULE | Freq: Every morning | ORAL | Status: DC
Start: 2015-05-12 — End: 2015-07-01

## 2015-03-12 MED ORDER — AMPHETAMINE-DEXTROAMPHETAMINE 10 MG OR TABS
ORAL_TABLET | ORAL | Status: DC
Start: 2015-05-12 — End: 2015-07-01

## 2015-03-12 MED ORDER — AMPHETAMINE-DEXTROAMPHETAMINE 10 MG OR TABS
ORAL_TABLET | ORAL | Status: DC
Start: 2015-03-12 — End: 2015-07-01

## 2015-03-12 MED ORDER — LISDEXAMFETAMINE DIMESYLATE 60 MG OR CAPS
60.0000 mg | ORAL_CAPSULE | Freq: Every morning | ORAL | Status: DC
Start: 2015-04-11 — End: 2015-03-12

## 2015-03-12 MED ORDER — AMPHETAMINE-DEXTROAMPHETAMINE 10 MG OR TABS
ORAL_TABLET | ORAL | Status: DC
Start: 2015-03-12 — End: 2015-03-12

## 2015-03-12 MED ORDER — LISDEXAMFETAMINE DIMESYLATE 60 MG OR CAPS
60.0000 mg | ORAL_CAPSULE | Freq: Every morning | ORAL | Status: DC
Start: 2015-03-12 — End: 2015-03-12

## 2015-03-12 MED ORDER — AMPHETAMINE-DEXTROAMPHETAMINE 10 MG OR TABS
ORAL_TABLET | ORAL | Status: DC
Start: 2015-04-11 — End: 2015-07-01

## 2015-03-12 NOTE — Progress Notes (Signed)
Lisa Flynn is a 39 year old female who comes in today for a chief concern of ADD/ADHD follow-up.       Current Medication(s): Vyvanse 60 mgs in the am and Adderall 10 mgs in the afternoon   Overall Lisa Flynn is doing well  Sleep: normal   Diet and weight: normal   Work: increased productivity   Tics/SE:none     Review of patient's allergies indicates:  No Known Allergies    OBJECTIVE:  BP 116/76 mmHg  Pulse 103  Temp(Src) 99.8 F (37.7 C) (Temporal)  Resp 12  Wt 126 lb (57.153 kg)  SpO2 100%  LMP 02/09/2015  General appearance: healthy, alert, no distress   Normal mood and affect   Neuro: Alert interactive and appropriate for age    A/P:     1. Stable   2. Follow up 3  months, sooner as needed.

## 2015-03-12 NOTE — Progress Notes (Signed)
Reason for Visit: See chief complaint     Refills? NO  Referral? NO  Letter or Form? NO  Lab Results? NO    HEALTH MAINTENANCE:  Has the patient has this done since their last visit?  Cervical screening/PAP: 01-25-11  Mammo: N/A  Colon Screen: N/A  Diabetic Eye Exam (If applicable): N/A      Have you seen a specialist since your last visit: No    Vaccines Due? No    PHQ2 done in the last year (365 days)? yes    Does patient have eCare?  yes    HM Due:   Health Maintenance   Topic Date Due   . Pap Smear  01/25/2016   . Tetanus Vaccine  01/18/2022   . Influenza Vaccine  Completed   . HIV Screen  Completed       PCP Verified?  Yes, Debelak

## 2015-06-26 ENCOUNTER — Telehealth (INDEPENDENT_AMBULATORY_CARE_PROVIDER_SITE_OTHER): Payer: Self-pay | Admitting: Family Medicine

## 2015-06-26 NOTE — Telephone Encounter (Signed)
Yes, this would be okay.

## 2015-06-26 NOTE — Telephone Encounter (Signed)
Routing to provider for review, would this be ok?

## 2015-06-26 NOTE — Telephone Encounter (Signed)
(  TEXTING IS AN OPTION FOR [facility] ONLY)  Is this a [facility] clinic? [facility] did not confirm if texts were allowed      RETURN CALL: Detailed message on voicemail only      SUBJECT:  Medication Management/Questions     MEDICATION(S): Amphetamine-Dextroamphetamine (ADDERALL) 10 MG Oral Tab  CONCERNS/QUESTIONS: Pt would like to know if she can schedule appointments with Wray Kearns to have this medication refilled.  ADDITIONAL INFORMATION: Pt states that she has met with Wray Kearns in the past to have this done, but the SOP states that this clinic will only see children. Please contact pt to discuss.

## 2015-06-26 NOTE — Telephone Encounter (Signed)
Called patient and LVM    CCR:  If patient calls back please assist in scheduling OV with PCP.  If questions or concerns can transfer to ex [phone].  Thank you.    Postponing to 3/17 for F/U.

## 2015-06-27 NOTE — Telephone Encounter (Signed)
Called patient and LVM informing her to return call to schedule OV with Dr.Debelak.     CCR:  If patient calls back please assist in scheduling OV with PCP.  If questions or concerns can transfer to ex [phone].  Thank you.    Postponing to 3/18 for F/U.

## 2015-06-30 NOTE — Telephone Encounter (Signed)
Pt has apt at [facility] for 3/21, closing TE

## 2015-07-01 ENCOUNTER — Encounter (INDEPENDENT_AMBULATORY_CARE_PROVIDER_SITE_OTHER): Payer: Self-pay | Admitting: Medical

## 2015-07-01 ENCOUNTER — Ambulatory Visit (INDEPENDENT_AMBULATORY_CARE_PROVIDER_SITE_OTHER): Payer: PRIVATE HEALTH INSURANCE | Admitting: Medical

## 2015-07-01 VITALS — BP 104/67 | HR 82 | Temp 98.2°F | Resp 14 | Wt 124.0 lb

## 2015-07-01 DIAGNOSIS — F988 Other specified behavioral and emotional disorders with onset usually occurring in childhood and adolescence: Secondary | ICD-10-CM

## 2015-07-01 DIAGNOSIS — F17200 Nicotine dependence, unspecified, uncomplicated: Secondary | ICD-10-CM

## 2015-07-01 MED ORDER — LISDEXAMFETAMINE DIMESYLATE 60 MG OR CAPS
60.0000 mg | ORAL_CAPSULE | Freq: Every morning | ORAL | Status: DC
Start: 2015-08-31 — End: 2015-10-07

## 2015-07-01 MED ORDER — LISDEXAMFETAMINE DIMESYLATE 60 MG OR CAPS
60.0000 mg | ORAL_CAPSULE | Freq: Every morning | ORAL | Status: DC
Start: 2015-07-01 — End: 2015-07-01

## 2015-07-01 MED ORDER — AMPHETAMINE-DEXTROAMPHETAMINE 10 MG OR TABS
ORAL_TABLET | ORAL | Status: DC
Start: 2015-07-01 — End: 2015-10-07

## 2015-07-01 MED ORDER — AMPHETAMINE-DEXTROAMPHETAMINE 10 MG OR TABS
ORAL_TABLET | ORAL | Status: DC
Start: 2015-08-01 — End: 2016-01-07

## 2015-07-01 MED ORDER — LISDEXAMFETAMINE DIMESYLATE 60 MG OR CAPS
60.0000 mg | ORAL_CAPSULE | Freq: Every morning | ORAL | Status: DC
Start: 2015-08-01 — End: 2015-07-01

## 2015-07-01 MED ORDER — VARENICLINE TARTRATE (STARTER) 0.5 MG X 11 & 1 MG X 42 OR TBPK
ORAL_TABLET | ORAL | Status: DC
Start: 2015-07-01 — End: 2015-09-03

## 2015-07-01 MED ORDER — AMPHETAMINE-DEXTROAMPHETAMINE 10 MG OR TABS
ORAL_TABLET | ORAL | Status: DC
Start: 2015-08-31 — End: 2016-01-07

## 2015-07-01 NOTE — Addendum Note (Signed)
Addended by: Griselda Miner on: 07/01/2015 12:06 PM     Modules accepted: Orders, Medications

## 2015-07-01 NOTE — Progress Notes (Signed)
Lisa Flynn is a 40 year old female who comes in today for a chief concern of ADD/ADHD follow-up.       Current Medication(s):Vyvanse 60 mgs in the am and Adderall 10 mgs in the afternoon    Overall Lisa Flynn is doing well   Sleep: normal   Diet and weight: normal   Work: good   Got an excellent performance  review   Tics/SE:none     Also would like a prescription for Chantix   She is smoking a little over a 1/2 ppd     Review of patient's allergies indicates:  No Known Allergies    OBJECTIVE:  BP 104/67 mmHg  Pulse 82  Temp(Src) 98.2 F (36.8 C) (Temporal)  Resp 14  Wt 124 lb (56.246 kg)  SpO2 100%  LMP 06/10/2015  General appearance: healthy, alert, no distress   Neuro: Alert interactive and appropriate for age  Normal mood and affect       (F98.8) Attention deficit disorder  (primary encounter diagnosis)  Stable, f/u in 3 months,sooner if needed   Plan: Amphetamine-Dextroamphetamine (ADDERALL) 10 MG         Oral Tab, Amphetamine-Dextroamphetamine         (ADDERALL) 10 MG Oral Tab,         Amphetamine-Dextroamphetamine (ADDERALL) 10 MG         Oral Tab, Lisdexamfetamine Dimesylate (VYVANSE)        60 MG Oral Cap      (F17.200) Tobacco use disorder  Plan: Varenicline Tartrate (CHANTIX STARTING MONTH         PAK) 0.5 MG X 11 & 1 MG X 42 Oral Misc

## 2015-07-01 NOTE — Progress Notes (Signed)
Reason for Visit: See chief complaint     Refills? YES  Referral? NO  Letter or Form? NO  Lab Results? NO    HEALTH MAINTENANCE:  Has the patient has this done since their last visit?  Cervical screening/PAP: 01-25-11  Mammo: N/A  Colon Screen: N/A  Diabetic Eye Exam (If applicable): N/A      Have you seen a specialist since your last visit: No    Vaccines Due? No    PHQ2 done in the last year (365 days)? yes    Does patient have eCare?  yes    HM Due:   Health Maintenance   Topic Date Due   . Pap Smear  01/25/2016   . Tetanus Vaccine  01/18/2022   . Influenza Vaccine  Completed   . HIV Screen  Completed       PCP Verified?  Yes, Debelak

## 2015-09-03 ENCOUNTER — Other Ambulatory Visit (INDEPENDENT_AMBULATORY_CARE_PROVIDER_SITE_OTHER): Payer: Self-pay | Admitting: Medical

## 2015-09-03 DIAGNOSIS — F17200 Nicotine dependence, unspecified, uncomplicated: Secondary | ICD-10-CM

## 2015-09-03 MED ORDER — CHANTIX STARTING MONTH PAK 0.5 MG X 11 & 1 MG X 42 OR TBPK
ORAL_TABLET | ORAL | 0 refills | Status: DC
Start: 2015-09-03 — End: 2016-01-07

## 2015-09-03 NOTE — Telephone Encounter (Signed)
This medication is outside of the [facility]'s protocols. Please sign and close the encounter if you approve: Chantix starting pak    If this medication is denied please have your staff inform the patient and schedule an appointment if necessary.

## 2015-09-03 NOTE — Telephone Encounter (Signed)
Pt informed

## 2015-09-03 NOTE — Telephone Encounter (Signed)
Prescription sent

## 2015-10-07 ENCOUNTER — Other Ambulatory Visit (INDEPENDENT_AMBULATORY_CARE_PROVIDER_SITE_OTHER): Payer: Self-pay | Admitting: Medical

## 2015-10-07 DIAGNOSIS — F988 Other specified behavioral and emotional disorders with onset usually occurring in childhood and adolescence: Secondary | ICD-10-CM

## 2015-10-09 NOTE — Telephone Encounter (Signed)
Medication Refill Documentation    Name of Medication: adderall and vyvanse    Prescribing provider:  Gasper Sells, PA-C    Date last filled: 08-31-15    Date last seen for this issue:  07-01-15      BP Readings from Last 2 Encounters:   07/01/15 104/67   03/12/15 116/76       Next scheduled appointment date:  Visit date not found

## 2015-10-11 MED ORDER — AMPHETAMINE-DEXTROAMPHETAMINE 10 MG OR TABS
ORAL_TABLET | ORAL | 0 refills | Status: DC
Start: 2015-10-11 — End: 2015-11-13

## 2015-10-11 MED ORDER — LISDEXAMFETAMINE DIMESYLATE 60 MG OR CAPS
60.0000 mg | ORAL_CAPSULE | Freq: Every morning | ORAL | 0 refills | Status: DC
Start: 2015-10-11 — End: 2015-11-13

## 2015-10-11 NOTE — Telephone Encounter (Signed)
Covering provider signed the script and sent Ecare to pt.    Closing TE

## 2015-11-13 ENCOUNTER — Other Ambulatory Visit (INDEPENDENT_AMBULATORY_CARE_PROVIDER_SITE_OTHER): Payer: Self-pay | Admitting: Family Medicine

## 2015-11-13 DIAGNOSIS — F988 Other specified behavioral and emotional disorders with onset usually occurring in childhood and adolescence: Secondary | ICD-10-CM

## 2015-11-13 DIAGNOSIS — F17200 Nicotine dependence, unspecified, uncomplicated: Secondary | ICD-10-CM

## 2015-11-13 NOTE — Telephone Encounter (Signed)
Defer to [facility]

## 2015-11-13 NOTE — Telephone Encounter (Signed)
Pending Prescriptions:                       Disp   Refills    Amphetamine-Dextroamphetamine (ADDERALL) *31 tab*0            Sig: 1 tablet by mouth each afternoon as needed    Lisdexamfetamine Dimesylate (VYVANSE) 60 *31 cap*0            Sig: Take 1 capsule (60 mg) by mouth every morning.    Varenicline Tartrate (CHANTIX CONTINUING *                  Medication Refill Documentation    Name of Medication: adderall and vyvanse, pt would also like her chantix refilled, but not the starter pack    Prescribing provider:  Gasper Sells, PA-C    Date last filled: 10-11-15    Date last seen for this issue:  07-01-15      BP Readings from Last 2 Encounters:   07/01/15 104/67   03/12/15 116/76       Next scheduled appointment date:  Visit date not found

## 2015-11-17 MED ORDER — VARENICLINE TARTRATE 1 MG OR TABS
1.0000 mg | ORAL_TABLET | Freq: Two times a day (BID) | ORAL | 2 refills | Status: DC
Start: 2015-11-17 — End: 2016-04-15

## 2015-11-17 MED ORDER — AMPHETAMINE-DEXTROAMPHETAMINE 10 MG OR TABS
ORAL_TABLET | ORAL | 0 refills | Status: DC
Start: 2015-11-17 — End: 2016-01-07

## 2015-11-17 MED ORDER — LISDEXAMFETAMINE DIMESYLATE 60 MG OR CAPS
60.0000 mg | ORAL_CAPSULE | Freq: Every morning | ORAL | 0 refills | Status: DC
Start: 2015-11-17 — End: 2016-01-07

## 2015-11-17 NOTE — Telephone Encounter (Signed)
Pr informed through ecare

## 2015-11-17 NOTE — Telephone Encounter (Signed)
Adderall at front desk    Chantix faxed

## 2016-01-07 ENCOUNTER — Ambulatory Visit (INDEPENDENT_AMBULATORY_CARE_PROVIDER_SITE_OTHER): Payer: PRIVATE HEALTH INSURANCE | Admitting: Family Medicine

## 2016-01-07 ENCOUNTER — Other Ambulatory Visit (INDEPENDENT_AMBULATORY_CARE_PROVIDER_SITE_OTHER): Payer: Self-pay | Admitting: Family Medicine

## 2016-01-07 ENCOUNTER — Other Ambulatory Visit (HOSPITAL_BASED_OUTPATIENT_CLINIC_OR_DEPARTMENT_OTHER)
Admit: 2016-01-07 | Discharge: 2016-01-07 | Disposition: A | Discharge status: Home / Self Care | Payer: PRIVATE HEALTH INSURANCE | Attending: Family Medicine | Admitting: Family Medicine

## 2016-01-07 ENCOUNTER — Encounter (INDEPENDENT_AMBULATORY_CARE_PROVIDER_SITE_OTHER): Payer: Self-pay | Admitting: Family Medicine

## 2016-01-07 VITALS — BP 108/70 | HR 63 | Temp 97.7°F | Resp 20 | Ht 64.25 in | Wt 132.4 lb

## 2016-01-07 DIAGNOSIS — Z1151 Encounter for screening for human papillomavirus (HPV): Secondary | ICD-10-CM | POA: Insufficient documentation

## 2016-01-07 DIAGNOSIS — Z0000 Encounter for general adult medical examination without abnormal findings: Secondary | ICD-10-CM

## 2016-01-07 DIAGNOSIS — Z01419 Encounter for gynecological examination (general) (routine) without abnormal findings: Secondary | ICD-10-CM

## 2016-01-07 DIAGNOSIS — Z6822 Body mass index (BMI) 22.0-22.9, adult: Secondary | ICD-10-CM

## 2016-01-07 DIAGNOSIS — F988 Other specified behavioral and emotional disorders with onset usually occurring in childhood and adolescence: Secondary | ICD-10-CM

## 2016-01-07 DIAGNOSIS — Z113 Encounter for screening for infections with a predominantly sexual mode of transmission: Secondary | ICD-10-CM

## 2016-01-07 LAB — LIPID PANEL
Cholesterol (LDL): 69 mg/dL (ref <130)
Cholesterol/HDL Ratio: 2.2
HDL Cholesterol: 74 mg/dL (ref >39)
Non-HDL Cholesterol: 86 mg/dL (ref 0–159)
Total Cholesterol: 160 mg/dL (ref <200)
Triglyceride: 85 mg/dL (ref <150)

## 2016-01-07 LAB — THYROID STIMULATING HORMONE: Thyroid Stimulating Hormone: 1.588 u[IU]/mL (ref 0.400–5.000)

## 2016-01-07 LAB — GLUCOSE, FASTING: Glucose, Fasting: 54 mg/dL — ABNORMAL LOW (ref 62–125)

## 2016-01-07 MED ORDER — AMPHETAMINE-DEXTROAMPHETAMINE 10 MG OR TABS
ORAL_TABLET | ORAL | 0 refills | Status: DC
Start: 2016-03-08 — End: 2016-04-15

## 2016-01-07 MED ORDER — LISDEXAMFETAMINE DIMESYLATE 60 MG OR CAPS
60.0000 mg | ORAL_CAPSULE | Freq: Every morning | ORAL | 0 refills | Status: DC
Start: 2016-03-08 — End: 2016-04-15

## 2016-01-07 MED ORDER — LISDEXAMFETAMINE DIMESYLATE 60 MG OR CAPS
60.0000 mg | ORAL_CAPSULE | Freq: Every morning | ORAL | 0 refills | Status: DC
Start: 2016-01-07 — End: 2016-01-07

## 2016-01-07 MED ORDER — AMPHETAMINE-DEXTROAMPHETAMINE 10 MG OR TABS
ORAL_TABLET | ORAL | 0 refills | Status: DC
Start: 2016-02-06 — End: 2016-04-15

## 2016-01-07 MED ORDER — AMPHETAMINE-DEXTROAMPHETAMINE 10 MG OR TABS
ORAL_TABLET | ORAL | 0 refills | Status: DC
Start: 2016-01-07 — End: 2016-04-15

## 2016-01-07 MED ORDER — LISDEXAMFETAMINE DIMESYLATE 60 MG OR CAPS
60.0000 mg | ORAL_CAPSULE | Freq: Every morning | ORAL | 0 refills | Status: DC
Start: 2016-02-06 — End: 2016-01-07

## 2016-01-07 NOTE — Patient Instructions (Signed)
Recommendations for Pap Smear screening age 40-65    . Pap smear every 3 years OR pap smear with HPV screening every 5 years  o Exceptions:   - if you have ever been treated for moderate or severe dysplasia of the cervix and have completed post-treatment surveillance, you can return to routine screening for your age group, and continue Pap screening for at least twenty years.  - If you have a suppressed immune system you may need more frequent screening.  . Discontinue screening at age 65 if there have been three consecutive normal Pap smears or negative HPV test.  . If you are sexually active, you should have a yearly screening for Chlamydia, HIV, and other sexually transmitted infections as appropriate  . If you are using contraception, you should see your provider yearly for symptom review, blood pressure check (if using a hormonal method), and exam if needed.  Leading a Healthy Life  Six tips to help improve your health and wellness     This explains how these 6 basic guidelines may improve your health and wellness:   Eat well to give your body the energy it needs.   Stay or get active.   A healthy mind is part of a healthy body.   Practice safe living habits.   Keep your mind and body free of harmful drugs and alcohol.   Get regular health care.     Tip #1: Eat well to give your body the energy it needs.   Your body needs nutritious foods to stay strong and healthy.   Here are some general eating guidelines:   Have 2 servings of fish or other seafood 2 times a week (1 serving = 4 ounces).   If you eat dairy products, choose low-fat (1%) or nonfat ones.   If you eat meat, cut down on the amount. Replace it with plant-based foods such as beans, whole grains, fruits and vegetables, and nuts and seeds.   Have less than 1,500 mg of sodium (salt) a day.   Cut down on "junk food" like alcohol, fatty foods, chips, candy, and other sweets.     Tip #2: Stay or get active.   Exercise for at least 30 minutes at a time, 3  times a week. Regular physical activity can help you:   Live longer and feel better   Be stronger and more flexible   Build strong bones   Prevent depression   Strengthen your immune system   Maintain a healthy body weight     Tip #3: Remember: A healthy mind is part of a healthy body.   A good state of mind can help you make healthy choices. Here are a few tips for keeping your mind healthy:   Reduce stress in your life.   Make some time every day for things that are fun.   Get enough sleep. Lack of sleep reduces how well you can concentrate, increases mood swings, and raises your risk of having a car accident.   Ask your health care provider for help if you feel depressed or anxious for more than several days in a row.     Tip #4: Practice safe living habits.   Accidents and Injuries     Accidents and injuries are the 5th leading cause of death in the [place]   Accidents in the home cause thousands of permanent injuries every year.     The most common accidents are fires, falls, and drowning. To help   yourself and your family stay safe:   Install smoke detectors on each floor of your home.   Make sure everyone in your family knows how to swim  Stay safe on the road:  Wear a seatbelt.   Do not ride with someone who has been drinking or taking drugs.   Do not speak on a cell phone or send, read, or write text messages while you are driving.   Wear a helmet when you ride a bicycle or motorcycle.   Get enough sleep at night, and do not drive when you are tired.     Hand Hygiene   Protect yourself from germs by washing your hands often. Always wash your hands:   After you change a diaper or use the toilet   Before you start and after you finish preparing food     Tip #5: Keep your mind and body free of harmful drugs and alcohol.   Tobacco causes more health problems than any other substance. These problems include lung disease, heart disease, and many types of cancer. The nicotine in tobacco is the most addictive and  widely used drug.   Too much alcohol can cause damage to your liver, heart, brain, bones, and other body tissues. Being under the influence of alcohol also increases your chance of being injured in an accident.    Alcohol can cause fetal alcohol syndrome in your children if you drink regularly when you are pregnant.   Street drugs, like marijuana, cocaine, methamphetamine, heroin, or pain pills not prescribed by your doctor can harm your health. They may be mixed with harmful substances, and using them can cause people to put themselves in dangerous situations.     Tip #6: Get regular health care.   Many people think they need to see the doctor only when they are sick. But, health care providers can also help you stay healthy.   Find a health care provider who works with you to manage your health.   Ask your health care provider what diseases you are at risk for. Learn what you can do to prevent or control them.   Get yourself and your family immunized against life-threatening diseases.

## 2016-01-07 NOTE — Progress Notes (Signed)
Lisa Flynn is a 40 year old female here today for a preventive health visit.   Other problems or concerns today:   1. ADD: would like refills on current medications.  Feels her dose is effective and denies side effects such as palpitations, insomnia or anorexia. Does skip breakfast most mornings.  Without medication she is easily distracted, has poor motivation, works on multiple projects without completion of any.    GYN HISTORY  Obstetric History    G1   P1   T1   P0   A0   L1     SAB0   TAB0   Ectopic0   Multiple0   Live Births0      Patient's last menstrual period was 01/03/2016 (exact date).   Periods are regular q 24 days, lasting 3 days.    Dysmenorrhea: moderate, occurring first 1-2 days of flow  Cyclic symptoms: irritability  Intermenstrual bleeding, spotting,or discharge: No   Last pap: 2012  Pap history: no history of abnormal paps  Contraception: yes:  IUD:  PARAGARD T380A  Other gyn history: none    SEXUAL HISTORY  Sexual activity: yes, single partner, contraception - IUD  Age at first intercourse: Not asked today  History of STD: none known  Number of sex partners in the past year: 1  Last STD check: not asked     Chlamydia screening offered: yes  HIV screening offered: yes  HPV vaccine offered: No  Sexual concerns: No  History of sexual or physical abuse: Not asked today  Have you been hit, kicked, punched, or otherwise hurt by someone within the past year:Not asked today    CANCER SCREENING  Family history of colon cancer: NO  Family history of uterine or ovarian cancer: NO  Family history of breast cancer: NO  Prior mammogram: YES   History of abnormal mammogram: NO    LIFESTYLE  Current dietary habits: healthy diet in general  Calcium: dietary sources only  Current exercise habits: yes, engages in regular exercise  Regular seat belt use: YES  Substance use:  reports that she has been smoking Cigarettes.  She has smoked for the past 15.00 years. She has never used smokeless tobacco.  She reports that she does not drink alcohol or use drugs.  Stress: Yes: just moved  Exposure to hazardous materials: Not asked today  Guns in the house: Not asked today    History sections of chart reviewed and updated today: YES    Review Of Systems  Weight: stable  Constitutional: denies fatigue, fever, chills, sweats  Regular dental exams: yes  GI: Denies constipation, diarrhea, or blood in stools; denies nausea, dysphagia, heartburn or other abd. pain  GU: denies abnormal vaginal bleeding, discharge or unusual pelvic pain, denies dysuria, frequency or gross hematuria  Endocrine: no known diabetes or thyroid disease   Skin: denies rash or other active skin concerns  Psych: Depression symptoms: none.  Other: none    EXAM:  BP 108/70   Pulse 63   Temp 97.7 F (36.5 C) (Temporal)   Resp 20   Ht 5' 4.25" (1.632 m)   Wt 132 lb 6.4 oz (60.1 kg)   LMP 01/03/2016 (Exact Date)   SpO2 100%   BMI 22.55 kg/m   Body mass index is 22.55 kg/m.  General appearance: healthy, alert, no distress  Skin: alopecia of scalp  Skin color, texture, turgor normal. No rashes or concerning lesions  Neck: supple. No adenopathy. Thyroid symmetric, normal size, without  nodules  Breasts: No obvious deformity or mass to inspection, nipples everted bilaterally, no skin lesion or nipple discharge, no mass palpated, no axillary lymphadenopathy, fibrocystic change without dominant masses, right > left   Lungs: normal, clear to auscultation and no cough nor wheeze with forced inspiration/expiration  Heart: normal rate, regular rhythm and no murmurs, clicks, or gallops  Abdomen: soft, non-tender. BS normal. No masses or organomegaly  Pelvic: normal bartholin/skene/urethral meatus/anus., Vagina is rugated and well-estrogenized, cervix normal in appearance, no CMT, IUD strings visible, no bladder tenderness, uterus normal size, shape, and consistency, no adnexal masses or tenderness   Rectal: deferred    ASSESSMENT/PLAN:  Health Maintenance    Topic Date Due   . Pap Smear  01/25/2016   . Influenza Vaccine (1) 01/11/2016   . Tetanus Vaccine  01/18/2022   . HIV Screen  Completed     Immunizations or studies due: Yes, see orders     (Z01.419) Encounter for gynecological examination without abnormal finding  (primary encounter diagnosis): routine advice as below, encouraged focusing on establishing healthy habits that will continue lifelong.   Discussed setting monthly goals for healthy diet and exercise until in a good routine of healthy diet and regular aerobic exercise 3-4 x per week for 30-60 min, or 10000 steps per day.    Discussed calcium intake, recommended 1000-1200 mg calcium through diet plus supplement, preferably through diet or smaller supplement doses divided throughout the day.    We discussed ACOG recommendations for annual screening mammograms starting at age 79, ACS recommendations for annual screening mammogram starting at age 53, then decreasing to q 2 years at age 66 and USPSTF recommendation for q 2 years screening starting at age 56. She elects for yearly starting now.    Plan: ZEBRA LABELS, PAP SMEAR (OPTIONAL HPV)      (F98.8) Attention deficit disorder: pt with good response to current medication and doses.  Refilled x 3 months, f/u at that time.    Plan: Amphetamine-Dextroamphetamine (ADDERALL) 10 MG         Oral Tab, Amphetamine-Dextroamphetamine         (ADDERALL) 10 MG Oral Tab,         Amphetamine-Dextroamphetamine (ADDERALL) 10 MG         Oral Tab, Lisdexamfetamine Dimesylate (VYVANSE)        60 MG Oral Cap, DISCONTINUED: Lisdexamfetamine         Dimesylate (VYVANSE) 60 MG Oral Cap,         DISCONTINUED: Lisdexamfetamine Dimesylate         (VYVANSE) 60 MG Oral Ca    (Z00.00) Laboratory exam ordered as part of routine general medical examination  Plan: THYROID STIMULATING HORMONE, LIPID PANEL,         GLUCOSE SERUM, FASTING    (Z11.3) Screening examination for venereal disease  Plan: HIV ANTIGEN AND ANTIBODY SCRN, HERPES TYPE  1 &         2 SEROLOGY, SEROLOGIC SYPHILIS PANEL, SRM,         Hepatitis C Ab with REFLEX PCR, GC&CHLAM         NUCLEIC ACID DETECTN              Preventive counseling: contraception  breast self-exam  immunizations  moderation in EtOH use/no drinking and driving  low-fat high-fiber diet  adequate calcium intake  vitamin D supplementation  proper exercise  Follow-up: 1 year for wellness, 3 months for ADD  Lab tests and results of any  diagnostic studies will be shared with the patient by  eCare

## 2016-01-07 NOTE — Progress Notes (Signed)
Reason for visit: Wellness       Cervical screening/PAP:Today  Mammo: UTD  Colon Screen: UTD  Have you seen a specialist since your last visit: YES   Name and location and date. ADD     HM Due:   Health Maintenance   Topic Date Due   . Pap Smear  01/25/2016   . Influenza Vaccine (1) 01/11/2016   . Tetanus Vaccine  01/18/2022   . HIV Screen  Completed              No future appointments.  Ileene Hutchinson, CMA (AAMA)

## 2016-01-08 LAB — HEPATITIS C AB WITH REFLEX PCR: Hepatitis C Antibody w/Rflx PCR: NONREACTIVE

## 2016-01-08 LAB — HIV ANTIGEN AND ANTIBODY SCRN
HIV Antigen and Antibody Interpretation: NONREACTIVE
HIV Antigen and Antibody Result: NONREACTIVE

## 2016-01-08 LAB — GC&CHLAM NUCLEIC ACID DETECTN
Chlam Trachomatis Nucleic Acid: NEGATIVE
N.Gonorrhoeae(GC) Nucleic Acid: NEGATIVE

## 2016-01-08 LAB — SEROLOGIC SYPHILIS PANEL, SRM
Syphilis Igg Ab Result: NEGATIVE
Syphilis Screening Status: NEGATIVE

## 2016-01-09 LAB — CERVICAL CANCER SCREENING: Cytologic Impression: NEGATIVE

## 2016-01-09 LAB — HERPES TYPE 1 & 2 SEROLOGY
HSV1 WB Result: POSITIVE
HSV2 WB Result: NEGATIVE

## 2016-01-09 LAB — HPV ONLY

## 2016-01-09 NOTE — Progress Notes (Signed)
[facility] 01/09/2016

## 2016-01-29 ENCOUNTER — Ambulatory Visit (INDEPENDENT_AMBULATORY_CARE_PROVIDER_SITE_OTHER): Payer: PRIVATE HEALTH INSURANCE

## 2016-01-29 DIAGNOSIS — Z23 Encounter for immunization: Secondary | ICD-10-CM

## 2016-01-29 NOTE — Progress Notes (Signed)
Vaccine Screening Questions      1.  Have you had a serious reaction or an allergic reaction to a vaccine?  NO    2.  Currently have a moderate or severe illness, including fever? (Don't Ask if vaccine   ordered by provider same day)  NO    3.  Ever had a seizure or a brain or other nervous system problem syndrome associated with a vaccine? (DTaP/TDaP/DTP pertinent) NO    4.  Is patient receiving  any live vaccinations today? (Varicella-Chickenpox, MMR-Measles/Mumps/Rubella, Zoster-Shingles)  NO    If YES to any of the questions above - Do NOT give vaccine.  Consult with RN or provider in clinic.  (#4 can be YES if all Live vaccine questions are answered NO)    If NO to all questions above - Patient may receive vaccine.    5.  Do you need to receive the Flu vaccine today? YES - Additional Flu Questions  Flu Vaccine Screening Questions:    Ever had a serious allergic reaction to eggs?  NO    Ever had Guillain-Barre syndrome associated with a vaccine? NO    Less than 6 months old? NO    If YES to any of the Flu questions above - NO Flu Vaccine to be given.  Patient may consult provider as needed.    If NO to all questions above - Patient may receive Flu Shot (IM)    If between 6 months and 73 years of age, was flu vaccine received last year?  N/A  If NO to above question:  . Children who are receiving influenza vaccine for the first time - administer 2 doses of the current influenza vaccine (separated by at least 4 weeks).          Vaccine information sheet(s) discussed, patient/parent/guardian verbalized understanding? YES     VIS given 01/29/2016 by Jabier Gauss MA.    Immunization Documentation:    Patient/Parent has reviewed the appropriate VIS and has all questions answered? YES    Vaccine(s) given today without initial adverse effect? Keya Paha, CMA (AAMA)

## 2016-04-15 ENCOUNTER — Ambulatory Visit (INDEPENDENT_AMBULATORY_CARE_PROVIDER_SITE_OTHER): Payer: PRIVATE HEALTH INSURANCE | Admitting: Family Medicine

## 2016-04-15 ENCOUNTER — Encounter (INDEPENDENT_AMBULATORY_CARE_PROVIDER_SITE_OTHER): Payer: Self-pay | Admitting: Family Medicine

## 2016-04-15 DIAGNOSIS — Z6820 Body mass index (BMI) 20.0-20.9, adult: Secondary | ICD-10-CM

## 2016-04-15 DIAGNOSIS — F909 Attention-deficit hyperactivity disorder, unspecified type: Secondary | ICD-10-CM

## 2016-04-15 MED ORDER — AMPHETAMINE-DEXTROAMPHETAMINE 10 MG OR TABS
ORAL_TABLET | ORAL | 0 refills | Status: DC
Start: 2016-05-16 — End: 2016-07-12

## 2016-04-15 MED ORDER — LISDEXAMFETAMINE DIMESYLATE 60 MG OR CAPS
60.0000 mg | ORAL_CAPSULE | Freq: Every morning | ORAL | 0 refills | Status: DC
Start: 2016-05-16 — End: 2016-04-15

## 2016-04-15 MED ORDER — AMPHETAMINE-DEXTROAMPHETAMINE 10 MG OR TABS
ORAL_TABLET | ORAL | 0 refills | Status: DC
Start: 2016-04-15 — End: 2016-07-12

## 2016-04-15 MED ORDER — LISDEXAMFETAMINE DIMESYLATE 60 MG OR CAPS
60.0000 mg | ORAL_CAPSULE | Freq: Every morning | ORAL | 0 refills | Status: DC
Start: 2016-04-15 — End: 2016-04-15

## 2016-04-15 MED ORDER — LISDEXAMFETAMINE DIMESYLATE 60 MG OR CAPS
60.0000 mg | ORAL_CAPSULE | Freq: Every morning | ORAL | 0 refills | Status: DC
Start: 2016-06-13 — End: 2016-07-12

## 2016-04-15 MED ORDER — AMPHETAMINE-DEXTROAMPHETAMINE 10 MG OR TABS
ORAL_TABLET | ORAL | 0 refills | Status: DC
Start: 2016-06-13 — End: 2016-07-12

## 2016-04-15 NOTE — Patient Instructions (Signed)
It was a pleasure to see you in clinic today. Your Medical Assistant was: Loletha Grayer can schedule an appointment to see [place] by calling  [phone] or via eCare.     If labs were ordered today the results are expected to be available via eCare 5 days later. Otherwise, result letters are mailed 7-10 days after your tests are completed. If your physician needs to change your care based on your results, you will receive a phone call to notify you. If you haven't heard from him/her and it has been more than 10 days please give [place] a call.     If you are not yet signed up for eCare, please speak with a team member at the front desk. eCare enrollment will allow you to make appointments online, view test results and obtain a copy of our After Visit Summary    Thank you for choosing [facility].

## 2016-04-15 NOTE — Progress Notes (Signed)
Reason for visit: ADD Medication      Cervical screening/PAP:UTD  Mammo: UTD  Colon Screen: UTD  Have you seen a specialist since your last visit: NO        HM Due:   Health Maintenance   Topic Date Due   . Cervical Cancer Screening (Pap Smear)  01/06/2021   . Tetanus Vaccine  01/18/2022   . Influenza Vaccine  Completed   . HIV Screening  Completed              No future appointments.  Ileene Hutchinson, CMA (AAMA)

## 2016-04-15 NOTE — Progress Notes (Signed)
1:59 PM  Lisa Flynn is a 41 year old year old female with a history of ADD who presents to clinic today for the following:    1.  ADD:  Here for medication refilled, stable on current dose for almost 1 year.  Feels dose is appropriate and helps her focus.   She is exercising regularly and drinks Redbull daily.    HA's resolved after starting ADD medications.    Denies any chest pain, palpitations, insomnia, unintentional weight loss.  4 weeks tobacco free!    (reviewed and updated today):  Patient Active Problem List   Diagnosis   . Alopecia areata   . Tobacco use disorder   . Headache   . Presence of intrauterine contraceptive device   . CONTROLLED SUBSTANCES PLAN ON FILE   . Attention deficit disorder   . Subclinical hyperthyroidism        meds reviewed as below    O: VS BP 118/79   Pulse 98   Temp 98.8 F (37.1 C) (Temporal)   Resp 20   Wt 122 lb 9.6 oz (55.6 kg)   LMP 03/26/2016 (Approximate)   SpO2 100%   BMI 20.88 kg/m    Wt Readings from Last 3 Encounters:   04/15/16 122 lb 9.6 oz (55.6 kg)   01/07/16 132 lb 6.4 oz (60.1 kg)   07/01/15 124 lb (56.2 kg)   General appearance: healthy, alert, no distress, smiling. Good range of affect   Lungs: clear to auscultation  Heart: no murmurs, clicks, or gallops,regular rate and rhythm    A/P:  (F90.9) Attention deficit hyperactivity disorder (ADHD), unspecified ADHD type:  Stable on current medications, refilled as below 3 months.  At her next visit we may consider refilling for 3 months and then having patient pick up prescriptions at 3 months without a visit, follow-up in person in 6 months.  Reviewed patient's weight and encouraged patient to make sure she doesn't have any further weight loss, discourage skipping meals and Redbull.  Discussed the risk of irregular heartbeat with Redbull plus her stimulants.  Plan: Amphetamine-Dextroamphetamine (ADDERALL) 10 MG         Oral Tab, Amphetamine-Dextroamphetamine         (ADDERALL) 10 MG Oral Tab,    Amphetamine-Dextroamphetamine (ADDERALL) 10 MG         Oral Tab, Lisdexamfetamine Dimesylate (VYVANSE)        60 MG Oral Cap, DISCONTINUED: Lisdexamfetamine         Dimesylate (VYVANSE) 60 MG Oral Cap,         DISCONTINUED: Lisdexamfetamine Dimesylate         (VYVANSE) 60 MG Oral Cap

## 2016-07-12 ENCOUNTER — Other Ambulatory Visit (INDEPENDENT_AMBULATORY_CARE_PROVIDER_SITE_OTHER): Payer: Self-pay | Admitting: Family Medicine

## 2016-07-12 DIAGNOSIS — F909 Attention-deficit hyperactivity disorder, unspecified type: Secondary | ICD-10-CM

## 2016-07-12 MED ORDER — LISDEXAMFETAMINE DIMESYLATE 60 MG OR CAPS
60.0000 mg | ORAL_CAPSULE | Freq: Every morning | ORAL | 0 refills | Status: DC
Start: 2016-09-12 — End: 2016-10-21

## 2016-07-12 MED ORDER — AMPHETAMINE-DEXTROAMPHETAMINE 10 MG OR TABS
ORAL_TABLET | ORAL | 0 refills | Status: DC
Start: 2016-09-12 — End: 2016-10-21

## 2016-07-12 MED ORDER — AMPHETAMINE-DEXTROAMPHETAMINE 10 MG OR TABS
ORAL_TABLET | ORAL | 0 refills | Status: DC
Start: 2016-08-12 — End: 2016-10-21

## 2016-07-12 MED ORDER — LISDEXAMFETAMINE DIMESYLATE 60 MG OR CAPS
60.0000 mg | ORAL_CAPSULE | Freq: Every morning | ORAL | 0 refills | Status: DC
Start: 2016-07-13 — End: 2016-07-12

## 2016-07-12 MED ORDER — AMPHETAMINE-DEXTROAMPHETAMINE 10 MG OR TABS
ORAL_TABLET | ORAL | 0 refills | Status: DC
Start: 2016-07-13 — End: 2016-10-21

## 2016-07-12 MED ORDER — LISDEXAMFETAMINE DIMESYLATE 60 MG OR CAPS
60.0000 mg | ORAL_CAPSULE | Freq: Every morning | ORAL | 0 refills | Status: DC
Start: 2016-08-12 — End: 2016-07-12

## 2016-07-12 NOTE — Telephone Encounter (Signed)
From: [name]  Sent: 07/12/2016 1:40 PM PDT  Subject: Medication Renewal Request    Lisa Flynn would like a refill of the following medications:     Amphetamine-Dextroamphetamine (ADDERALL) 10 MG Oral Tab Bevelyn Ngo, MD]   Patient Comment: Need next 3 months prescription     Lisdexamfetamine Dimesylate (VYVANSE) 60 MG Oral Cap Bevelyn Ngo, MD]   Patient Comment: Need next 3 months prescription    Preferred pharmacy: [facility] [phone] [place] [phone]

## 2016-07-12 NOTE — Telephone Encounter (Addendum)
Please ask what her weight is and remind her to schedule a f/u visit in 3 months.     3 months of rx printed at [facility] 2, please place at front desk for pick up.

## 2016-07-13 NOTE — Telephone Encounter (Signed)
eCare sent and prescriptions placed at the front desk

## 2016-10-21 ENCOUNTER — Telehealth (INDEPENDENT_AMBULATORY_CARE_PROVIDER_SITE_OTHER): Payer: Self-pay | Admitting: Family Medicine

## 2016-10-21 DIAGNOSIS — F909 Attention-deficit hyperactivity disorder, unspecified type: Secondary | ICD-10-CM

## 2016-10-21 MED ORDER — AMPHETAMINE-DEXTROAMPHETAMINE 10 MG OR TABS
ORAL_TABLET | ORAL | 0 refills | Status: DC
Start: 2016-10-21 — End: 2016-10-28

## 2016-10-21 MED ORDER — LISDEXAMFETAMINE DIMESYLATE 60 MG OR CAPS
60.0000 mg | ORAL_CAPSULE | Freq: Every morning | ORAL | 0 refills | Status: DC
Start: 2016-10-21 — End: 2016-10-28

## 2016-10-21 NOTE — Telephone Encounter (Signed)
Rx's placed at front desk for pick up.

## 2016-10-21 NOTE — Telephone Encounter (Signed)
Called and relayed provider message. Patient confirmed understanding. Nothing further needed, closing TE.

## 2016-10-21 NOTE — Telephone Encounter (Signed)
Adderall  Last OV 04/15/16  Last written 07/12/16  Rx and pharmacy pended.   Routing to provider to complete or relay instructions for next step    Vyvanse  Last OV 04/15/16  Last written 07/12/16  Rx and pharmacy pended.   Routing to provider to complete or relay instructions for next step

## 2016-10-21 NOTE — Telephone Encounter (Signed)
1 month refilled, please have pt schedule a follow up visit.  Approved and printed at [facility], please have pt pick up.

## 2016-10-21 NOTE — Telephone Encounter (Signed)
(  TEXTING IS AN OPTION FOR [facility] ONLY)  Is this a [facility] clinic? Yes. What is the mobile number we can use to get a hold of you via text? [phone]      RETURN CALL: Detailed message on voicemail only      SUBJECT:  Medication Management/Questions     MEDICATION(S): 1) Amphetamine-Dextroamphetamine (ADDERALL) 10 MG Oral Tab and 2) Lisdexamfetamine Dimesylate (VYVANSE) 60 MG Oral Cap    CONCERNS/QUESTIONS: Patient called to request for Prescription Refill SCRIPT that she would like to pick up at your front desk by tomorrow, Friday, 10/22/16. Patient only has 3 left of each medication.    Please contact the patient as soon as possible today, Thursday, 10/21/16.    ADDITIONAL INFORMATION: [id number] [place] [phone] [Patient [id number]][phone]

## 2016-10-28 ENCOUNTER — Encounter (INDEPENDENT_AMBULATORY_CARE_PROVIDER_SITE_OTHER): Payer: Self-pay | Admitting: Family Medicine

## 2016-10-28 ENCOUNTER — Ambulatory Visit (INDEPENDENT_AMBULATORY_CARE_PROVIDER_SITE_OTHER): Payer: PRIVATE HEALTH INSURANCE | Admitting: Family Medicine

## 2016-10-28 DIAGNOSIS — Z6821 Body mass index (BMI) 21.0-21.9, adult: Secondary | ICD-10-CM

## 2016-10-28 DIAGNOSIS — F909 Attention-deficit hyperactivity disorder, unspecified type: Secondary | ICD-10-CM

## 2016-10-28 MED ORDER — AMPHETAMINE-DEXTROAMPHETAMINE 10 MG OR TABS
ORAL_TABLET | ORAL | 0 refills | Status: DC
Start: 2016-12-22 — End: 2017-03-14

## 2016-10-28 MED ORDER — AMPHETAMINE-DEXTROAMPHETAMINE 10 MG OR TABS
ORAL_TABLET | ORAL | 0 refills | Status: DC
Start: 2016-11-21 — End: 2016-10-28

## 2016-10-28 MED ORDER — LISDEXAMFETAMINE DIMESYLATE 60 MG OR CAPS
60.0000 mg | ORAL_CAPSULE | Freq: Every morning | ORAL | 0 refills | Status: DC
Start: 2016-12-22 — End: 2017-01-15

## 2016-10-28 MED ORDER — LISDEXAMFETAMINE DIMESYLATE 60 MG OR CAPS
60.0000 mg | ORAL_CAPSULE | Freq: Every morning | ORAL | 0 refills | Status: DC
Start: 2016-11-21 — End: 2016-10-28

## 2016-10-28 NOTE — Patient Instructions (Signed)
It was a pleasure to see you in clinic today. Your Medical Assistant was: Loletha Grayer can schedule an appointment to see [place] by calling  [phone] or via eCare.     If labs were ordered today the results are expected to be available via eCare 5 days later. Otherwise, result letters are mailed 7-10 days after your tests are completed. If your physician needs to change your care based on your results, you will receive a phone call to notify you. If you haven't heard from him/her and it has been more than 10 days please give [place] a call.     If you are not yet signed up for eCare, please speak with a team member at the front desk. eCare enrollment will allow you to make appointments online, view test results and obtain a copy of our After Visit Summary    Thank you for choosing [facility].

## 2016-10-28 NOTE — Progress Notes (Signed)
Reason for visit: Medication      Cervical screening/PAP:UTD  Mammo: UTD  Colon Screen: UTD  Have you seen a specialist since your last visit: NO        HM Due:   Health Maintenance   Topic Date Due   . Influenza Vaccine (1) 01/10/2017   . Depression Screening (PHQ-2)  04/15/2017   . Cervical Cancer Screening (Pap Smear)  01/06/2021   . Tetanus Vaccine  01/18/2022   . HIV Screening  Completed              No future appointments.  Ileene Hutchinson, CMA (AAMA)

## 2016-10-28 NOTE — Progress Notes (Signed)
12:03 PM  Lisa Flynn is a 41 year old year old female with a history of ADD who presents to clinic today for the following:    1.  ADD: feels that maybe she is becoming more tolerant to medications, noticing more difficulties with concentration which then makes her less productive.   Very busy with schedules over the family.    Denies chest pain, palpitations, insomnia    ( reviewed and updated today):  Patient Active Problem List   Diagnosis   . Alopecia areata   . Headache   . Presence of intrauterine contraceptive device   . CONTROLLED SUBSTANCES PLAN ON FILE   . Attention deficit disorder   . Subclinical hyperthyroidism     meds reviewed as below    O: VS BP 104/66   Pulse 76   Temp 98.8 F (37.1 C) (Temporal)   Resp 12   Wt 125 lb 3.2 oz (56.8 kg)   LMP 10/23/2016 (Exact Date)   SpO2 100%   BMI 21.32 kg/m    Wt Readings from Last 3 Encounters:   10/28/16 125 lb 3.2 oz (56.8 kg)   04/15/16 122 lb 9.6 oz (55.6 kg)   01/07/16 132 lb 6.4 oz (60.1 kg)   General appearance: healthy, alert, no distress, good range of affect   Heart: normal rate, regular rhythm, no murmurs, clicks, or gallops and no murmurs, clicks, or gallops,regular rate and rhythm    A/P:   (F90.9) Attention deficit hyperactivity disorder (ADHD), unspecified ADHD type:  Refilled medications at current dosing.  Reviewed that busy lifestyle maybe contributing to difficulties focusing.  Offered referral to adult ADD specialist to consider change in dose or medication if she is finding these less effective.  Pt may contact me requesting referral.  F/u in 6 months, may call for refills in 3 months.  Plan: Amphetamine-Dextroamphetamine (ADDERALL) 10 MG         Oral Tab, Lisdexamfetamine Dimesylate (VYVANSE)        60 MG Oral Cap, DISCONTINUED:         Amphetamine-Dextroamphetamine (ADDERALL) 10 MG         Oral Tab, DISCONTINUED: Lisdexamfetamine         Dimesylate (VYVANSE) 60 MG Oral Cap

## 2017-01-11 ENCOUNTER — Encounter (INDEPENDENT_AMBULATORY_CARE_PROVIDER_SITE_OTHER): Payer: Self-pay | Admitting: Family Medicine

## 2017-01-11 DIAGNOSIS — F988 Other specified behavioral and emotional disorders with onset usually occurring in childhood and adolescence: Secondary | ICD-10-CM

## 2017-01-12 NOTE — Telephone Encounter (Signed)
Please call pt and ask them to read their ecare messages.

## 2017-01-12 NOTE — Telephone Encounter (Signed)
Called pt. Pt states she would like the referral sent to [facility]

## 2017-01-15 ENCOUNTER — Other Ambulatory Visit (INDEPENDENT_AMBULATORY_CARE_PROVIDER_SITE_OTHER): Payer: Self-pay | Admitting: Family Medicine

## 2017-01-15 DIAGNOSIS — F909 Attention-deficit hyperactivity disorder, unspecified type: Secondary | ICD-10-CM

## 2017-01-17 MED ORDER — LISDEXAMFETAMINE DIMESYLATE 60 MG OR CAPS
60.0000 mg | ORAL_CAPSULE | Freq: Every morning | ORAL | 0 refills | Status: DC
Start: 2017-01-20 — End: 2017-03-14

## 2017-01-17 NOTE — Telephone Encounter (Signed)
Approved and printed at [facility], please place at front desk

## 2017-01-17 NOTE — Telephone Encounter (Signed)
RX placed at the front desk.

## 2017-01-18 NOTE — Telephone Encounter (Signed)
Pt signed for rx. ID verified.

## 2017-01-25 ENCOUNTER — Ambulatory Visit (INDEPENDENT_AMBULATORY_CARE_PROVIDER_SITE_OTHER): Payer: PRIVATE HEALTH INSURANCE | Admitting: Family

## 2017-01-25 ENCOUNTER — Encounter (INDEPENDENT_AMBULATORY_CARE_PROVIDER_SITE_OTHER): Payer: Self-pay | Admitting: Family

## 2017-01-25 VITALS — BP 105/69 | HR 99 | Temp 98.6°F | Resp 12 | Ht 65.0 in | Wt 124.0 lb

## 2017-01-25 DIAGNOSIS — Z6820 Body mass index (BMI) 20.0-20.9, adult: Secondary | ICD-10-CM

## 2017-01-25 DIAGNOSIS — F988 Other specified behavioral and emotional disorders with onset usually occurring in childhood and adolescence: Secondary | ICD-10-CM

## 2017-01-25 DIAGNOSIS — E0590 Thyrotoxicosis, unspecified without thyrotoxic crisis or storm: Secondary | ICD-10-CM

## 2017-01-25 MED ORDER — LISDEXAMFETAMINE DIMESYLATE 70 MG OR CAPS
70.0000 mg | ORAL_CAPSULE | Freq: Every morning | ORAL | 0 refills | Status: DC
Start: 2017-01-25 — End: 2017-03-14

## 2017-01-25 MED ORDER — BUPROPION HCL 100 MG OR TABS
100.0000 mg | ORAL_TABLET | Freq: Two times a day (BID) | ORAL | 2 refills | Status: DC
Start: 2017-01-25 — End: 2017-06-10

## 2017-01-25 NOTE — Progress Notes (Signed)
CC:  ADHD Consultation    Patient is already diagnosed with ADHD inattentive type.   She is prescribed Vyvanse with her PCP.  Lately medication is no longer worker well for her and she is requesting a higher dose. Here to discuss options for treatment.    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  -Her boss told her she was going to be fired if she did not do her work.   -She researched ADHD and was seen and treated.   -This changed her life.    -She eventually started doing better and was promoted to senior buyer and supervisor in 2014.     -She went up on the dose in 2015 or so from 40 mg to 60 mg. Since then she was working well.    -She did well at this position until approximately 3 months ago. It seemed like the medication now just no longer working. It is only lasting about 1.5 hrs into work.     Stress - This has been about the same. Daughter is 66 and Son is 30. She works and parents her children.  Anxiety - she is worried about her job.  Stressful especially now.  Depression- she denies any dark thoughts, hopelessness, depression. She has been treated for depression in the past. She was going through a divorce with first husband and she was 33. Got married at 45. She was depressed. Did not try to hurt herself. Never seen a psychiatrist. She got better. Does not remember the medication she took nor for how long.      She is drinking 16 to 24 ounces of red bull sugar free daily. She loves the taste. She consumes these drinks in addition to the Vyvanse.     She denies any mood swings or irritability.  Denies changes in diet.    She has been at this job for 20 yrs.  In a fairly steady relationship  Prior to this job she was home  Did not graduate from high school.  -she did not do anything at work when she was pregnant  -stayed at one job for 20 yrs  - she did not graduate from [employer]: See problem  list  PAST SURGERIES:   FAMILY HX: no mental illness. Both sisters have ADHD as well.   SOCIAL HX: See history in EPIC.  HABITS:  Cigerettes - smoking 1/4 pack per day  No THC,Occasionally drinking but not daily, no exercise plan  REVIEW OFSYSTEMS: system review included the following positive:  Constitution:   Sleep - always 11:30 pm and feels good waking up at 5 am, no chills or night sweats  HEENT: no change in vision, no headache  Cardiac: denies palpitations or chest pain  Lungs: denies cough or shortness of breat  Neuro: She denies any migraines no weakness no numbness or tingling  GI:  Denis constipation, diarrhea or abdominal pain  GU:no dysuria, no increase in urinary frequency  Gyn: periods are regular, no hot flashes or signs of early menopause  Psych:    Otherwise were reviewed and negative    HEALTH MAINTENANCE REVIEW: updated      OBJECTIVE:   PHYSICAL EXAM:  General: healthy, alert, no distress, relaxed, cooperative  Skin: Skin color, texture, turgor normal. No rashes or concerning lesions  Head: negative  Neck: supple.  Lungs: clear  to auscultation  Heart: normal rate, regular rhythm and no murmurs, clicks, or gallops  Behavior/Activity: calm and confident  Speech: clear, coherent, fluent.  Affect: blunted and congruent with stressors  Mood: flat.    Thought form: circumstantial  Thought content: focused on increasing the vyvanse does not want to discuss her mood  Attention/concentration: distractable   Insight/Judgment: good insight and judgment and low insight    Initial Pathway PHQ-9 Score    Flowsheets    Last refreshed: 01/27/2017  8:37 AM:  Depression Care Pathway Initial PHQ-9 Not on file         Current as of: 01/27/2017  8:37 AM         Recent PHQ-9 Scores     PHQ9 Date 02/12/2015 01/07/2016 01/25/2017    PHQ9 Total Score 3 0 4        GAD7 Total Scores       01/07/2016 01/25/2017          Total: 2 3              ASSESSMENT:    (F98.8) Attention deficit disorder, unspecified hyperactivity  presence  (primary encounter diagnosis)      I spent a total time of 25 minutes face-to-face with the patient, of which more than 50% was spent counseling and coordinating care as outlined in this note.  We discussed the importance of reviewing potential reasons why her dopamine level might be low and how key processes must be working for Vyvanse to be effective. We also discussed how energy drinks can interfere with digestive enzymes and how these are critical to the absorption of the Vyvanse.       PLAN:    Kylie was seen today for consultation.    Diagnoses and all orders for this visit:    Attention deficit disorder, unspecified hyperactivity presence      (F98.8) Attention deficit disorder, unspecified hyperactivity presence  (primary encounter diagnosis)  Plan: Lisdexamfetamine Dimesylate 70 MG Oral Cap,         BuPROPion HCl 100 MG Oral Tab, TSH with         Reflexive Free T4, FERRITIN, VITAMIN D (25         HYDROXY)          (E05.90) Hyperthyroidism  Plan: TSH with Reflexive Free T4, ANTI THYROGLOBULIN,        ANTI THYROID PEROXIDASE          RETURN ON:  See her PCP if these tests are abnormal. Correct these findings before changing ADHD stimulant.    I let her know I was happy to see her as well f Dr. Justice Deeds was unavailable.

## 2017-01-25 NOTE — Patient Instructions (Signed)
It was a pleasure to see you in clinic today.                If you are not yet signed up for eCare, please see the below eCare section at the end of this document for how to enroll and to get your access codes.  It's easy to sign up at home today.    eCare  enrollment will allow you access to the below benefits   You can make appointments online   View test results / Lab Results   Request prescription renewals   Obtain a copy of our After Visit Summary (an electronic copy of this document)     Your Test Results:  If labs were ordered today the results are expected to be available via eCare in about 5 days. If you have an active eCare account, this is how we will notify you of your results.     If you do not have an eCare account then your test results will be mailed to you within about 14 days after your tests are completed. If your physician needs to change your care based on your results or is concerned, you should expect a phone call or eCare message from your provider.    If you have any questions about your test results please schedule an appointment with your provider, so that we may review them with you in greater detail.    **If it has been more than 2 weeks and you have not received your test results please send our office a message via eCare.    Medication Refills: If you need a prescription refilled, please contact your pharmacy 1 week before your current supply will run out to request the refill.  Contacting your pharmacy is the fastest and safest way to obtain a medication refill.  The pharmacy will notify our office.  Please note, that a minimum of 48 to 72 hours is needed to refill a medication,  Please call your pharmacy early to allow enough time to refill before you anticipate running out.  For faster medication refills, you can also schedule an appointment with your provider.    We know you have a choice in where you receive your healthcare and we sincerely thank you for trusting [facility] with your health.

## 2017-01-25 NOTE — Progress Notes (Signed)
Reviewed eCare status with Patient:  NO    HEALTH MAINTENANCE:  Has the patient had any of these completed since their last visit?    Cervical Cancer Screening: Not Due     Breast Cancer Screening: Not Due    Colorectal Cancer Screening: Not Due    Diabetic Eye Exam: Not Due    Immunizations Due: FLU DUE    Have you seen a specialist since your last visit: No    Patient accompanied by Self for this visit.    HM Due:   Health Maintenance   Topic Date Due    Influenza Vaccine (1) 01/10/2017    Depression Screening (PHQ-2)  10/28/2017    Cervical Cancer Screening (Pap Smear)  01/06/2021    Tetanus Vaccine  01/18/2022    HIV Screening  Completed         Future Appointments  Date Time Provider [place]   01/25/2017 5:00 PM Rocky Crafts, ARNP [facility]

## 2017-01-26 ENCOUNTER — Encounter (INDEPENDENT_AMBULATORY_CARE_PROVIDER_SITE_OTHER): Payer: Self-pay | Admitting: Family Medicine

## 2017-01-26 DIAGNOSIS — R768 Other specified abnormal immunological findings in serum: Secondary | ICD-10-CM | POA: Insufficient documentation

## 2017-01-26 LAB — THYROGLOBULIN ANTIBODY (AUTOIMMUNE HYPOTHYROIDISM): Anti thyroglobulin: 25.3 [IU]/mL — ABNORMAL HIGH (ref 0.0–3.9)

## 2017-01-26 LAB — FERRITIN: Ferritin: 9 ng/mL — ABNORMAL LOW (ref 10–180)

## 2017-01-26 LAB — TSH WITH REFLEXIVE FREE T4: TSH with Reflexive Free T4: 2.624 u[IU]/mL (ref 0.400–5.000)

## 2017-01-26 LAB — ANTI THYROID PEROXIDASE: Anti Thyroid Peroxidase: >800 [IU]/mL — ABNORMAL HIGH (ref 0.0–8.9)

## 2017-01-26 NOTE — Progress Notes (Signed)
The results of your thyroid antibodies are abnormal. Your Ferritin level is quite low.     For Thyroid: I recommend follow up with Dr. Justice Deeds to discuss this findings. You need to increase your iron stores and address thyroid inflammation.     Changes in the thyroid function can impact your concentration. You do not have low or high thyroid levels at the moment. Please discuss this with Dr. Justice Deeds.    2. Low iron stores -Before changing the Vyvanse dose further, please increase your iron stores.    We know that the amount of available dopamine is influenced by an enzyme called tyrosine hydroxylase.  If ferritin is low, the tyrosine hydroxylase enzyme is less effective. Thus there is less dopamine available.      ADHD is a complex neurodevelopmental condition.  It starts in childhood and is likely triggered by toxins that lead to a depletion of dopamine. Your low ferritin level is a valuable finding.  Increasing your ferritin may increase your dopamine.       Lisa Flynn, Kerby Nora, PhD

## 2017-01-27 LAB — VITAMIN D (25 HYDROXY)
Vit D (25_Hydroxy) Total: 7.7 ng/mL — ABNORMAL LOW (ref 20.1–50.0)
Vitamin D2 (25_Hydroxy): <1 ng/mL
Vitamin D3 (25_Hydroxy): 7.7 ng/mL

## 2017-01-29 ENCOUNTER — Encounter (INDEPENDENT_AMBULATORY_CARE_PROVIDER_SITE_OTHER): Payer: Self-pay | Admitting: Family

## 2017-01-29 DIAGNOSIS — E559 Vitamin D deficiency, unspecified: Secondary | ICD-10-CM

## 2017-01-31 MED ORDER — VITAMIN D3 1.25 MG (50000 UT) OR CAPS
50000.0000 [IU] | ORAL_CAPSULE | ORAL | 0 refills | Status: DC
Start: 2017-01-31 — End: 2017-07-04

## 2017-01-31 NOTE — Progress Notes (Signed)
The results of your Vitamin D are abnormal.    I recommend that you complete the Vitamin D prescription I sent to your pharmacy. One 50,000 unit capsule/tab once per week for 8 weeks then start 800 to 2000 IU daily during winter months.        Nelva Bush, Kerby Nora, PhD

## 2017-02-10 ENCOUNTER — Other Ambulatory Visit: Payer: Self-pay

## 2017-02-19 ENCOUNTER — Encounter (INDEPENDENT_AMBULATORY_CARE_PROVIDER_SITE_OTHER): Payer: Self-pay | Admitting: Family Medicine

## 2017-03-07 ENCOUNTER — Ambulatory Visit (INDEPENDENT_AMBULATORY_CARE_PROVIDER_SITE_OTHER): Payer: PRIVATE HEALTH INSURANCE

## 2017-03-07 DIAGNOSIS — Z23 Encounter for immunization: Secondary | ICD-10-CM

## 2017-03-07 NOTE — Progress Notes (Signed)
Vaccine Screening Questions    Interpreter: No    1. Are you allergic to Latex? NO    2.  Have you had a serious reaction or an allergic reaction to a vaccine?  NO    3.  Currently have a moderate or severe illness, including fever?  NO    4.  Ever had a seizure or any neurological problem associated with a vaccine? (DTaP/TDaP/DTP pertinent) NO    5.  Is patient receiving any live vaccinations today? (Varicella-Chickenpox, MMR-Measles/Mumps/Rubella, Zoster-Shingles, Flumist, Yellow Fever) NOTE: oral rotavirus is exempt  NO    If YES to any of the questions above - Do NOT give vaccine.  Consult with RN or provider in clinic.  (#5 can be YES if all Live vaccine questions are answered NO)    If NO to all questions above - Patient may receive vaccine.    6.  Do you need to receive the Flu vaccine today? YES - Additional Flu Questions  Flu Vaccine Screening Questions:    Ever had a serious allergic reaction to eggs?  NO    Ever had Guillain-Barre syndrome associated with a vaccine? NO    Less than 6 months old? NO    If YES to any of the Flu questions above - NO Flu Vaccine to be given.  Patient may consult provider as needed.    If NO to all questions above - Patient may receive Flu Shot (IM)    If between 6 months and 72 years of age, was flu vaccine received last year?    If NO to above question:   Children who are receiving influenza vaccine for the first time - administer 2 doses of the current influenza vaccine (separated by at least 4 weeks).          All patients are encouraged to wait 15 minutes before leaving after receiving any vaccine.    VIS given 03/07/2017 by Lenard Forth, CMA      Vaccine given today without initial adverse effect. YES    Lenard Forth, CMA

## 2017-03-14 ENCOUNTER — Ambulatory Visit (INDEPENDENT_AMBULATORY_CARE_PROVIDER_SITE_OTHER): Payer: PRIVATE HEALTH INSURANCE | Admitting: Family Medicine

## 2017-03-14 ENCOUNTER — Encounter (INDEPENDENT_AMBULATORY_CARE_PROVIDER_SITE_OTHER): Payer: Self-pay | Admitting: Family Medicine

## 2017-03-14 VITALS — BP 117/77 | HR 74 | Temp 98.1°F | Resp 16 | Wt 129.0 lb

## 2017-03-14 DIAGNOSIS — R7989 Other specified abnormal findings of blood chemistry: Secondary | ICD-10-CM

## 2017-03-14 DIAGNOSIS — R790 Abnormal level of blood mineral: Secondary | ICD-10-CM

## 2017-03-14 DIAGNOSIS — Z01419 Encounter for gynecological examination (general) (routine) without abnormal findings: Secondary | ICD-10-CM

## 2017-03-14 DIAGNOSIS — Z6821 Body mass index (BMI) 21.0-21.9, adult: Secondary | ICD-10-CM

## 2017-03-14 DIAGNOSIS — E0590 Thyrotoxicosis, unspecified without thyrotoxic crisis or storm: Secondary | ICD-10-CM

## 2017-03-14 DIAGNOSIS — F988 Other specified behavioral and emotional disorders with onset usually occurring in childhood and adolescence: Secondary | ICD-10-CM

## 2017-03-14 MED ORDER — LISDEXAMFETAMINE DIMESYLATE 70 MG OR CAPS
70.0000 mg | ORAL_CAPSULE | Freq: Every morning | ORAL | 0 refills | Status: DC
Start: 2017-03-14 — End: 2017-03-14

## 2017-03-14 MED ORDER — FERROUS SULFATE 324 (65 FE) MG OR TBEC
324.0000 mg | DELAYED_RELEASE_TABLET | Freq: Every day | ORAL | 11 refills | Status: DC
Start: 2017-03-14 — End: 2017-07-04

## 2017-03-14 MED ORDER — LISDEXAMFETAMINE DIMESYLATE 70 MG OR CAPS
70.0000 mg | ORAL_CAPSULE | Freq: Every morning | ORAL | 0 refills | Status: DC
Start: 2017-05-15 — End: 2017-06-10

## 2017-03-14 MED ORDER — LISDEXAMFETAMINE DIMESYLATE 70 MG OR CAPS
70.0000 mg | ORAL_CAPSULE | Freq: Every morning | ORAL | 0 refills | Status: DC
Start: 2017-04-14 — End: 2017-03-14

## 2017-03-14 NOTE — Progress Notes (Signed)
Reason for visit: Wellness      Cervical screening/PAP:UTD  Mammo: UTD  Colon Screen: UTD  Have you seen a specialist since your last visit: NO      HM Due:   Health Maintenance   Topic Date Due    Depression Screening (PHQ-2)  01/25/2018    Cervical Cancer Screening (Pap Smear)  01/06/2021    Tetanus Vaccine  01/18/2022    Influenza Vaccine  Completed    HIV Screening  Completed              Future Appointments  Date Time Provider [place]   03/14/2017 6:45 PM Debelak, Burna Mortimer, MD [facility]

## 2017-03-14 NOTE — Patient Instructions (Signed)
Recommendations for Pap Smear screening age 41-65    . Pap smear every 3 years OR pap smear with HPV screening every 5 years  o Exceptions:   - if you have ever been treated for moderate or severe dysplasia of the cervix and have completed post-treatment surveillance, you can return to routine screening for your age group, and continue Pap screening for at least twenty years.  - If you have a suppressed immune system you may need more frequent screening.  . Discontinue screening at age 65 if there have been three consecutive normal Pap smears or negative HPV test.  . If you are sexually active, you should have a yearly screening for Chlamydia, HIV, and other sexually transmitted infections as appropriate  . If you are using contraception, you should see your provider yearly for symptom review, blood pressure check (if using a hormonal method), and exam if needed.  Leading a Healthy Life  Six tips to help improve your health and wellness     This explains how these 6 basic guidelines may improve your health and wellness:   Eat well to give your body the energy it needs.   Stay or get active.   A healthy mind is part of a healthy body.   Practice safe living habits.   Keep your mind and body free of harmful drugs and alcohol.   Get regular health care.     Tip #1: Eat well to give your body the energy it needs.   Your body needs nutritious foods to stay strong and healthy.   Here are some general eating guidelines:   Have 2 servings of fish or other seafood 2 times a week (1 serving = 4 ounces).   If you eat dairy products, choose low-fat (1%) or nonfat ones.   If you eat meat, cut down on the amount. Replace it with plant-based foods such as beans, whole grains, fruits and vegetables, and nuts and seeds.   Have less than 1,500 mg of sodium (salt) a day.   Cut down on "junk food" like alcohol, fatty foods, chips, candy, and other sweets.     Tip #2: Stay or get active.   Exercise for at least 30 minutes at a time, 3  times a week. Regular physical activity can help you:   Live longer and feel better   Be stronger and more flexible   Build strong bones   Prevent depression   Strengthen your immune system   Maintain a healthy body weight     Tip #3: Remember: A healthy mind is part of a healthy body.   A good state of mind can help you make healthy choices. Here are a few tips for keeping your mind healthy:   Reduce stress in your life.   Make some time every day for things that are fun.   Get enough sleep. Lack of sleep reduces how well you can concentrate, increases mood swings, and raises your risk of having a car accident.   Ask your health care provider for help if you feel depressed or anxious for more than several days in a row.     Tip #4: Practice safe living habits.   Accidents and Injuries     Accidents and injuries are the 5th leading cause of death in the [place]   Accidents in the home cause thousands of permanent injuries every year.     The most common accidents are fires, falls, and drowning. To help   yourself and your family stay safe:   Install smoke detectors on each floor of your home.   Make sure everyone in your family knows how to swim  Stay safe on the road:  Wear a seatbelt.   Do not ride with someone who has been drinking or taking drugs.   Do not speak on a cell phone or send, read, or write text messages while you are driving.   Wear a helmet when you ride a bicycle or motorcycle.   Get enough sleep at night, and do not drive when you are tired.     Hand Hygiene   Protect yourself from germs by washing your hands often. Always wash your hands:   After you change a diaper or use the toilet   Before you start and after you finish preparing food     Tip #5: Keep your mind and body free of harmful drugs and alcohol.   Tobacco causes more health problems than any other substance. These problems include lung disease, heart disease, and many types of cancer. The nicotine in tobacco is the most addictive and  widely used drug.   Too much alcohol can cause damage to your liver, heart, brain, bones, and other body tissues. Being under the influence of alcohol also increases your chance of being injured in an accident.    Alcohol can cause fetal alcohol syndrome in your children if you drink regularly when you are pregnant.   Street drugs, like marijuana, cocaine, methamphetamine, heroin, or pain pills not prescribed by your doctor can harm your health. They may be mixed with harmful substances, and using them can cause people to put themselves in dangerous situations.     Tip #6: Get regular health care.   Many people think they need to see the doctor only when they are sick. But, health care providers can also help you stay healthy.   Find a health care provider who works with you to manage your health.   Ask your health care provider what diseases you are at risk for. Learn what you can do to prevent or control them.   Get yourself and your family immunized against life-threatening diseases.

## 2017-03-14 NOTE — Progress Notes (Signed)
Lisa Flynn is a 41 year old female here today for a preventive health visit.   Other problems or concerns today:   1. ADD:  Recently switched from Vyvanse to Lisdexamphetamine and feels that it is working moderately to manage her attention.  Does notice difficulties with focus, particularly if she is trying to rush or multitask.    Vitamin D and iron levels were low, started on vitamin D but is not started on any iron yet.  She has not noticed any difference over the month.  Also started on buproprion and has been happy it has helped her reduce tobacco use to 1/2 cigarette per day    2.  Thyroid receptor antibody positive: Patient had positive autoimmune testing with a normal TSH on recent blood tests.  Patient denies any weight changes or temperature intolerances.    GYN HISTORY  Obstetric History    G1   P1   T1   P0   A0   L1     SAB0   TAB0   Ectopic0   Multiple0   Live Births0        Currently having periods: YES  Patient's last menstrual period was 03/01/2017 (exact date).   Periods are regular q 28-30 days, lasting 3-4 days.    Crampy, lower abdominal pain during periods: mild, occurring premenstrually  Cyclic symptoms: irritability  Intermenstrual bleeding, spotting,or discharge: No   Last pap: 2017  Pap history: no history of abnormal paps  Other gyn history: none    SEXUAL HISTORY  Sexual activity: yes, single partner, contraception - IUD: ParaGard T380A IUD  02/23/2008  Age at first intercourse: Not asked today  History of STDs: none known  Number of sex partners in the past year: 1  Last STD check: not asked  New partner(s) since last STD check: No    Sexual concerns: No    History of sexual or physical abuse: Not asked today  History of any other forms of abuse (e.g. verbal, financial): Not asked today  Has the patient been hit, kicked, punched, or otherwise hurt by someone within the past year: Not asked today    CANCER SCREENING  Family history of colon cancer: NO  Family history of  uterine or ovarian cancer: NO  Family history of breast cancer: NO  Prior mammogram: YES   History of abnormal mammogram: N/A    LIFESTYLE  Current dietary habits: healthy diet in general  Calcium: dietary sources only about 1 per day  Current exercise habits: generally active every day > 30 min  Regular seat belt use: N/A  Substance use:  reports that she has been smoking Cigarettes.  She has a 3.75 pack-year smoking history. She has never used smokeless tobacco. She reports that she drinks about 1.2 - 1.8 oz of alcohol per week . She reports that she does not use drugs.  Exposure to hazardous materials: Not asked today  Guns in the house: Not asked today    Review Of Systems  Regular dental exams: yes  Constitutional: Denies fatigue fever chills   Respiratory: Denies cough wheezing   Cardiovascular: Denies chest pain or pressure at rest or during exercise palpitations any cardiovascular problems   GI: Denies nausea vomiting   Neurologic: Denies dizziness syncope focal weakness   Psych: Denies depressed mood sleep disturbance anxiety any psychological problems    EXAM:  BP 117/77    Pulse 74    Temp 98.1 F (36.7 C) (Temporal)  Resp 16    Wt 129 lb (58.5 kg)    LMP 03/01/2017 (Exact Date)    SpO2 99%    BMI 21.47 kg/m   Body mass index is 21.47 kg/m.  General: healthy, alert, no distress  Head: Normocephalic. No masses, lesions, tenderness or abnormalities  Lungs: Nonlabored breathing, CTAB no wheezing  Heart: normal rate, regular rhythm and no murmurs, clicks, or gallops  Skin: Skin color, texture, turgor normal. No rashes or concerning lesions  Abdomen: soft, non-tender. BS normal. No masses or organomegaly  Neuro:  Grossly normal to observation, gait normal  Breasts: No obvious deformity or mass to inspection, nipples everted bilaterally, no skin lesion or nipple discharge, no mass palpated, no axillary lymphadenopathy, fibrocystic change without dominant masses  Pelvic exam: normal bartholin/skene/urethral  meatus/anus., Vagina is rugated and well-estrogenized, cervix normal in appearance, no CMT, no bladder tenderness, uterus normal size, shape, and consistency, no adnexal masses or tenderness    ASSESSMENT/PLAN:  (Z01.419) Routine gynecological examination  (primary encounter diagnosis): routine advice as below, encouraged continuing healthy diet and regular aerobic exercise 3-4 x per week for 30-60 min, or 10000 steps per day.    Discussed calcium intake, recommended 1000-1200 mg calcium through diet plus supplement, preferably through diet or smaller supplement doses divided throughout the day.    We discussed ACOG recommendations for annual screening mammograms starting at age 74, ACS recommendations for annual screening mammogram starting at age 51, then decreasing to q 2 years at age 53 and USPSTF recommendation for q 2 years screening starting at age 70. She elects for yearly, will get at [place] near her work      Education officer, community) Attention deficit disorder, unspecified hyperactivity presence:  Stable on current medications, refilled x 3 months although pt does not feel they are as effective as in the past. Agree with tx for Vit D and iron deficiency.  recheck labs in 1 month.   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        Health Maintenance   Topic Date Due    Depression Screening (PHQ-2)  03/14/2018    Cervical Cancer Screening (Pap Smear)  01/06/2021    Tetanus Vaccine  01/18/2022    Influenza Vaccine  Completed     HIV Screening  Completed       Immunizations or studies due: as ordered below  Chlamydia screening offered: no  HIV screening offered: no  HPV vaccine offered: No    Preventive counseling: contraception  breast self-exam  skin cancer prevention/self-exam  immunizations  dental care  moderation in EtOH use/no drinking and driving  Follow-up: 1 year

## 2017-04-28 ENCOUNTER — Encounter (INDEPENDENT_AMBULATORY_CARE_PROVIDER_SITE_OTHER): Payer: Self-pay | Admitting: Family Medicine

## 2017-05-12 ENCOUNTER — Telehealth (INDEPENDENT_AMBULATORY_CARE_PROVIDER_SITE_OTHER): Payer: Self-pay | Admitting: Family Medicine

## 2017-05-12 NOTE — Telephone Encounter (Signed)
Pharmacy notified and verbalized understanding. Closing TE

## 2017-05-12 NOTE — Telephone Encounter (Signed)
Yes, this can be filled early

## 2017-05-12 NOTE — Telephone Encounter (Signed)
Lisdexamfetamine Dimesylate 70 MG Oral Cap        Sig: Take 1 capsule (70 mg) by mouth every morning.          Pt is to fill this on 02/3 but pharmacist states that there were 31 days in dec and jan so pt should be due for a fill but also that pt is leaving out of town for weekend. Asking if this can filled today? Please advise, thank you

## 2017-06-10 ENCOUNTER — Other Ambulatory Visit (INDEPENDENT_AMBULATORY_CARE_PROVIDER_SITE_OTHER): Payer: Self-pay | Admitting: Family Medicine

## 2017-06-10 ENCOUNTER — Other Ambulatory Visit (INDEPENDENT_AMBULATORY_CARE_PROVIDER_SITE_OTHER): Payer: Self-pay | Admitting: Family

## 2017-06-10 DIAGNOSIS — F988 Other specified behavioral and emotional disorders with onset usually occurring in childhood and adolescence: Secondary | ICD-10-CM

## 2017-06-10 MED ORDER — LISDEXAMFETAMINE DIMESYLATE 70 MG OR CAPS
70.0000 mg | ORAL_CAPSULE | Freq: Every morning | ORAL | 0 refills | Status: DC
Start: 2017-06-10 — End: 2017-06-10

## 2017-06-10 MED ORDER — LISDEXAMFETAMINE DIMESYLATE 70 MG OR CAPS
70.0000 mg | ORAL_CAPSULE | Freq: Every morning | ORAL | 0 refills | Status: DC
Start: 2017-06-10 — End: 2017-07-04

## 2017-06-10 MED ORDER — BUPROPION HCL 100 MG OR TABS
100.0000 mg | ORAL_TABLET | Freq: Two times a day (BID) | ORAL | 2 refills | Status: DC
Start: 2017-06-10 — End: 2017-09-26

## 2017-06-10 NOTE — Telephone Encounter (Signed)
This medication is outside of the [facility]'s protocols. Please sign and close the encounter if you approve:BuPROPion (for tobacco dependence)    If this medication is denied please have your staff inform the patient and schedule an appointment if necessary.

## 2017-06-10 NOTE — Telephone Encounter (Signed)
Patient can pick up on month of Vyvanse   Needs appointment for refills in April

## 2017-06-11 NOTE — Telephone Encounter (Signed)
Called patient someone answered the phone and hung up.     SFK:CLEX patient returns call please inform script for Vyvanse is ready for pick up at the clinic front desk in [place] message sent to patient.

## 2017-06-12 NOTE — Telephone Encounter (Signed)
Patient picked up RX. Patient aware appt needed for April refill.    Closing TE.

## 2017-06-14 ENCOUNTER — Other Ambulatory Visit: Payer: Self-pay

## 2017-06-15 ENCOUNTER — Other Ambulatory Visit (INDEPENDENT_AMBULATORY_CARE_PROVIDER_SITE_OTHER): Payer: PRIVATE HEALTH INSURANCE

## 2017-07-01 ENCOUNTER — Other Ambulatory Visit (INDEPENDENT_AMBULATORY_CARE_PROVIDER_SITE_OTHER): Payer: PRIVATE HEALTH INSURANCE

## 2017-07-01 DIAGNOSIS — R7989 Other specified abnormal findings of blood chemistry: Secondary | ICD-10-CM

## 2017-07-01 DIAGNOSIS — R790 Abnormal level of blood mineral: Secondary | ICD-10-CM

## 2017-07-01 LAB — IRON BINDING CAPACITY (W/IRON, TRANSFERRIN & TRANSF SAT)
Iron, SRM: 124 ug/dL (ref 31–171)
Total Iron Binding Capacity: 428 ug/dL (ref 270–535)
Transferrin Saturation: 29 % (ref 10–45)
Transferrin: 306 mg/dL (ref 192–382)

## 2017-07-01 LAB — FERRITIN: Ferritin: 11 ng/mL (ref 10–180)

## 2017-07-03 NOTE — Result Encounter Note (Signed)
[facility] 07/03/2017

## 2017-07-04 ENCOUNTER — Encounter (INDEPENDENT_AMBULATORY_CARE_PROVIDER_SITE_OTHER): Payer: Self-pay | Admitting: Family Medicine

## 2017-07-04 ENCOUNTER — Ambulatory Visit (INDEPENDENT_AMBULATORY_CARE_PROVIDER_SITE_OTHER): Payer: PRIVATE HEALTH INSURANCE | Admitting: Family Medicine

## 2017-07-04 VITALS — BP 110/73 | HR 80 | Temp 97.1°F | Resp 12 | Wt 128.2 lb

## 2017-07-04 DIAGNOSIS — Z6821 Body mass index (BMI) 21.0-21.9, adult: Secondary | ICD-10-CM

## 2017-07-04 DIAGNOSIS — F17200 Nicotine dependence, unspecified, uncomplicated: Secondary | ICD-10-CM

## 2017-07-04 DIAGNOSIS — R790 Abnormal level of blood mineral: Secondary | ICD-10-CM

## 2017-07-04 DIAGNOSIS — F988 Other specified behavioral and emotional disorders with onset usually occurring in childhood and adolescence: Secondary | ICD-10-CM

## 2017-07-04 DIAGNOSIS — E559 Vitamin D deficiency, unspecified: Secondary | ICD-10-CM

## 2017-07-04 MED ORDER — LISDEXAMFETAMINE DIMESYLATE 70 MG OR CAPS
70.0000 mg | ORAL_CAPSULE | Freq: Every morning | ORAL | 0 refills | Status: DC
Start: 2017-09-12 — End: 2017-09-26

## 2017-07-04 MED ORDER — LISDEXAMFETAMINE DIMESYLATE 70 MG OR CAPS
70.0000 mg | ORAL_CAPSULE | Freq: Every morning | ORAL | 0 refills | Status: DC
Start: 2017-08-12 — End: 2017-07-04

## 2017-07-04 MED ORDER — LISDEXAMFETAMINE DIMESYLATE 70 MG OR CAPS
70.0000 mg | ORAL_CAPSULE | Freq: Every morning | ORAL | 0 refills | Status: DC
Start: 2017-07-13 — End: 2017-07-04

## 2017-07-04 MED ORDER — FERROUS SULFATE 324 (65 FE) MG OR TBEC
324.0000 mg | DELAYED_RELEASE_TABLET | Freq: Two times a day (BID) | ORAL | 11 refills | Status: DC
Start: 2017-07-04 — End: 2018-07-17

## 2017-07-04 NOTE — Patient Instructions (Addendum)
Patient Education   Take your iron with vitamin C or orange juice twice daily      Constipation (Adult)  Constipation means that you have bowel movements that are less frequent than usual. Stools often become very hard and difficult to pass.  Constipation is very common. At some point in life it affects almost everyone. Since everyone's bowel habits are different, what is constipation to one person may not be to another. Your healthcare provider may do tests to diagnose constipation. It depends on whathe or shefinds when evaluating you.    Symptoms of constipation include:   Abdominal pain   Bloating   Vomiting   Painful bowel movements   Itching, swelling, bleeding, or pain around the anus  Causes  Constipation can have many causes. These include:   Diet low in fiber   Too much dairy   Not drinking enough liquids   Lack of exercise or physical activity. This is especially true for older adults.   Changes in lifestyle or daily routine, including pregnancy, aging, work, and travel   Frequent use or misuse of laxatives   Ignoring the urge to have a bowel movement or delaying it until later   Medicines, such as certain prescription pain medicines, iron supplements, antacids, certain antidepressants, and calcium supplements   Diseases like irritable bowel syndrome, bowel obstructions, stroke, diabetes, thyroid disease, Parkinson disease, hemorrhoids, and colon cancer  Complications  Potential complications of constipation can include:   Hemorrhoids   Rectal bleeding from hemorrhoids or anal fissures(skin tears)   Hernias   Dependency on laxatives   Chronic constipation   Fecal impaction   Bowel obstruction or perforation  Home care  All treatment should be done after talking with your healthcare provider. This is especially true if you have another medical problems, are taking prescription medicines, or are an older adult. Treatment most often involves lifestyle changes. You may also need  medicines. Your healthcare provider will tell you which will work best for you. Follow the advice below to help avoid this problem in the future.  Lifestyle changes  These lifestyle changes can help prevent constipation:   Diet. Eat a high-fiber diet, with fresh fruit and vegetables, and reduce dairy intake, meats, and processed foods   Fluids. It's important to get enough fluids each day. Drink plenty of water when you eat more fiber. If you are on diet that limits the amount of fluid you can have, talk about this with your healthcare provider.   Regular exercise. Check with your healthcare provider first.  Medicines  Take any medicines as directed. Some laxatives are safe to use only every now and then. Others can be taken on a regular basis. Talk with your doctor or pharmacist if you have questions.  Prescription pain medicines can cause constipation. If you are taking this kind of medicine, ask your healthcare provider if you should also take a stool softener.  Medicines you may take to treat constipation include:   Fiber supplements   Stool softeners   Laxatives   Enemas   Rectal suppositories  Follow-up care  Follow up with your healthcare provider if symptoms don't get better in the next few days. You may need to have more tests or see a specialist.  Call 911  Call 911 if any of these occur:   Trouble breathing   Stiff, rigid abdomen that is severely painful to touch   Confusion   Fainting or loss of consciousness   Rapid heart rate  Chest pain  When to seek medical advice  Call your healthcare provider right away if any of these occur:   Fever of 100.1F (38C) or higher, or as directed by your healthcare provider   Failure to resume normal bowel movements   Pain in your abdomen or back gets worse   Nausea or vomiting   Swelling in your abdomen   Blood in the stool   Black, tarry stool   Involuntary weight loss   Weakness  Date Last Reviewed: 04/10/2014   2000-2017 [employer]. [address] [place] [address]. All rights reserved. This information is not intended as a substitute for professional medical care. Always follow your healthcare professional's instructions.

## 2017-07-04 NOTE — Progress Notes (Signed)
Reason for visit: ADD      Cervical screening/PAP: 12/2020  Mammo:  NA   Colon Screen:  NA   Have you seen a specialist since your last visit: NO     If  Name and location and date.     HM Due:   Health Maintenance   Topic Date Due    Depression Screening (PHQ-2)  03/14/2018    Cervical Cancer Screening  01/06/2021    Tetanus Vaccine  01/18/2022    Influenza Vaccine  Completed    HIV Screening  Completed              Future Appointments   Date Time Provider [place]   07/04/2017  8:15 AM Debelak, Burna Mortimer, MD [facility]

## 2017-07-04 NOTE — Progress Notes (Signed)
8:15 AM  Lisa Flynn is a 42 year old year old female with a history of ADD who presents to clinic today for the following:    1.  Iron def anemia:  Has been taking 325 mg iron sulfate daily, although misses on the weekends frequently.  Does not feel any different, I.e. No improvement in energy or attention as hoped for by her ADD prescriber.  Some nausea, improved if she does not take it with food. No constipation.  2.  Vit D def:  Taking vit daily, thinks 4000 IU since the 50,000 weekly for 8 weeks.  As above, does not feel any increased energy.  3.  Tobacco cessation:   Taking buproprion and feels is it helping her cravings. Slowly cutting back, smoking less of each cigarette. About 1/4 pack per day.   4. ADD:  Would like to increase her dosing but previous prescriber wanted nutrition deficiency treated first.  She is happy continuing on current dose for now.  Would prefer not to drive to specialist for now.    ROS:  Denies chest pain, palpitation, shortness of breath, dyspnea on exertion      (reviewed and updated today):  Patient Active Problem List   Diagnosis    Alopecia areata    Headache    Presence of intrauterine contraceptive device    CONTROLLED SUBSTANCES PLAN ON FILE    Attention deficit disorder    Subclinical hyperthyroidism    Thyroid antibody positive     meds reviewed as below    O: VS BP 110/73    Pulse 80    Temp 97.1 F (36.2 C) (Temporal)    Resp 12    Wt 128 lb 3.2 oz (58.2 kg)    LMP 06/06/2017    SpO2 100%    BMI 21.33 kg/m   General appearance: healthy, alert, no distress, good range of affect   Lungs: clear to auscultation and no cough nor wheeze with forced inspiration/expiration  Heart: no murmurs, clicks, or gallops,regular rate and rhythm    Orders Only on 07/01/17   1. Ferritin   Result Value Ref [facility]    Ferritin 11 10 - 180 ng/mL   2. Iron Binding Capacity and Total Iron   Result Value Ref [facility]    Transferrin 306 192 - 382 mg/dL    Iron, SRM 124 31 - 171 ug/dL    Total Iron  Binding Capacity 428 270 - 535 ug/dL    Transferrin Saturation 29 10 - 45 %         A/P:  (F98.8) Attention deficit disorder, unspecified hyperactivity presence  (primary encounter diagnosis):  Stable, refilled as below x 3 months. Will f/u in 3 months and see if attention has improved with vit supplementation or if she should return to specialist for further discussion.  Plan: Lisdexamfetamine Dimesylate 70 MG Oral Cap,         DISCONTINUED: Lisdexamfetamine Dimesylate 70 MG        Oral Cap, DISCONTINUED: Lisdexamfetamine         Dimesylate 70 MG Oral Cap    (R79.0) Low ferritin: somewhat improved, recommended a trial of twice daily dosing, watch for constipation. Take with vit C. Recheck in 3 months.   Plan: Ferrous Sulfate 324 (65 Fe) MG Oral Tab EC,         Iron Binding Capacity and Total Iron, Ferritin,        CBC with Differential    (E55.9) Vitamin D deficiency:  lab pending.  Pt to confirm the dose she is taking in case dose needs to be adjusted.     (F17.200) Tobacco use disorder: continue bupropion at current dose and setting goals to taper off cigarettes.

## 2017-07-05 LAB — VITAMIN D (25 HYDROXY)
Vit D (25_Hydroxy) Total: 30.8 ng/mL (ref 20.1–50.0)
Vitamin D2 (25_Hydroxy): 14.6 ng/mL
Vitamin D3 (25_Hydroxy): 16.2 ng/mL

## 2017-07-05 NOTE — Result Encounter Note (Signed)
[facility] 07/05/2017

## 2017-07-07 ENCOUNTER — Other Ambulatory Visit: Payer: Self-pay

## 2017-09-19 ENCOUNTER — Other Ambulatory Visit (INDEPENDENT_AMBULATORY_CARE_PROVIDER_SITE_OTHER): Payer: PRIVATE HEALTH INSURANCE

## 2017-09-19 DIAGNOSIS — R790 Abnormal level of blood mineral: Secondary | ICD-10-CM

## 2017-09-19 LAB — CBC, DIFF
% Basophils: 1 %
% Eosinophils: 3 %
% Immature Granulocytes: 0 %
% Lymphocytes: 30 %
% Monocytes: 10 %
% Neutrophils: 56 %
% Nucleated RBC: 0 %
Absolute Eosinophil Count: 0.18 10*3/uL (ref 0.00–0.50)
Absolute Lymphocyte Count: 1.98 10*3/uL (ref 1.00–4.80)
Basophils: 0.08 10*3/uL (ref 0.00–0.20)
Hematocrit: 42 % (ref 36–45)
Hemoglobin: 13.9 g/dL (ref 11.5–15.5)
Immature Granulocytes: 0.01 10*3/uL (ref 0.00–0.05)
MCH: 33.7 pg — ABNORMAL HIGH (ref 27.3–33.6)
MCHC: 32.9 g/dL (ref 32.2–36.5)
MCV: 102 fL — ABNORMAL HIGH (ref 81–98)
Monocytes: 0.68 10*3/uL (ref 0.00–0.80)
Neutrophils: 3.58 10*3/uL (ref 1.80–7.00)
Nucleated RBC: 0 10*3/uL
Platelet Count: 212 10*3/uL (ref 150–400)
RBC: 4.13 10*6/uL (ref 3.80–5.00)
RDW-CV: 12.6 % (ref 11.6–14.4)
WBC: 6.51 10*3/uL (ref 4.3–10.0)

## 2017-09-19 LAB — FERRITIN: Ferritin: 13 ng/mL (ref 10–180)

## 2017-09-19 LAB — IRON BINDING CAPACITY (W/IRON, TRANSFERRIN & TRANSF SAT)
Iron, SRM: 134 ug/dL (ref 31–171)
Total Iron Binding Capacity: 360 ug/dL (ref 270–535)
Transferrin Saturation: 37 % (ref 10–45)
Transferrin: 257 mg/dL (ref 192–382)

## 2017-09-19 NOTE — Result Encounter Note (Signed)
[facility] 09/19/2017

## 2017-09-26 ENCOUNTER — Ambulatory Visit (INDEPENDENT_AMBULATORY_CARE_PROVIDER_SITE_OTHER): Payer: PRIVATE HEALTH INSURANCE | Admitting: Family Medicine

## 2017-09-26 ENCOUNTER — Encounter (INDEPENDENT_AMBULATORY_CARE_PROVIDER_SITE_OTHER): Payer: Self-pay | Admitting: Family Medicine

## 2017-09-26 VITALS — BP 108/74 | HR 64 | Temp 97.4°F | Resp 14 | Wt 137.0 lb

## 2017-09-26 DIAGNOSIS — Z6822 Body mass index (BMI) 22.0-22.9, adult: Secondary | ICD-10-CM

## 2017-09-26 DIAGNOSIS — F988 Other specified behavioral and emotional disorders with onset usually occurring in childhood and adolescence: Secondary | ICD-10-CM

## 2017-09-26 DIAGNOSIS — R790 Abnormal level of blood mineral: Secondary | ICD-10-CM | POA: Insufficient documentation

## 2017-09-26 MED ORDER — LISDEXAMFETAMINE DIMESYLATE 70 MG OR CAPS
70.0000 mg | ORAL_CAPSULE | Freq: Every morning | ORAL | 0 refills | Status: DC
Start: 2017-11-12 — End: 2017-12-06

## 2017-09-26 MED ORDER — LISDEXAMFETAMINE DIMESYLATE 70 MG OR CAPS
70.0000 mg | ORAL_CAPSULE | Freq: Every morning | ORAL | 0 refills | Status: DC
Start: 2017-10-12 — End: 2017-09-26

## 2017-09-26 MED ORDER — BUPROPION HCL 100 MG OR TABS
100.0000 mg | ORAL_TABLET | Freq: Two times a day (BID) | ORAL | 1 refills | Status: DC
Start: 2017-09-26 — End: 2017-09-26

## 2017-09-26 MED ORDER — BUPROPION HCL 100 MG OR TABS
100.0000 mg | ORAL_TABLET | Freq: Two times a day (BID) | ORAL | 1 refills | Status: DC
Start: 2017-09-26 — End: 2017-12-06

## 2017-09-26 NOTE — Progress Notes (Signed)
8:02 AM  Lisa Flynn is a 42 year old year old female with a history of ADD who presents to clinic today for the following:    1.  Low ferritin:  See lab results below.  She has been taking one of the iron supplement in the morning, but misses the second dose about 50% of the time.  Does not feel energy or attention symptoms have improved.  Taking vit D daily.    2.  ADD:  Workload is higher due to some coworkers leaving, but feels that her medication isn't working as well as she'd like.  For example, she is moving homes and gets distracted by packing, has only completed about 30% in 1 week.   Mood is doing well.  Felt that bupropion and worked better for smoking cessation with a certain color brand, not as effective with other brands.      ROS:  Planning on quitting smoking when she moves homes, will be living with her mother  Denies chest pain, palpitations, insomnia, weight loss       (reviewed and updated today):  Patient Active Problem List   Diagnosis    Alopecia areata    Headache    Presence of intrauterine contraceptive device    CONTROLLED SUBSTANCES PLAN ON FILE    Attention deficit disorder    Subclinical hyperthyroidism    Thyroid antibody positive      meds reviewed as below    O: VS BP 108/74    Pulse 64    Temp 97.4 F (36.3 C) (Temporal)    Resp 14    Wt 137 lb (62.1 kg)    LMP 09/05/2017    SpO2 100%    BMI 22.80 kg/m    Wt Readings from Last 3 Encounters:   09/26/17 137 lb (62.1 kg)   07/04/17 128 lb 3.2 oz (58.2 kg)   03/14/17 129 lb (58.5 kg)   General appearance: healthy, alert, no distress, good range of affect   Heart: no murmurs, clicks, or gallops,regular rate and rhythm        Orders Only on 09/19/17   1. CBC with Differential   Result Value Ref [facility]    WBC 6.51 4.3 - 10.0 10*3/uL    RBC 4.13 3.80 - 5.00 10*6/uL    Hemoglobin 13.9 11.5 - 15.5 g/dL    Hematocrit 42 36 - 45 %    MCV 102 (H) 81 - 98 fL    MCH 33.7 (H) 27.3 - 33.6 pg    MCHC 32.9 32.2 - 36.5 g/dL    Platelet Count 212 150  - 400 10*3/uL    RDW-CV 12.6 11.6 - 14.4 %    % Neutrophils 56 %    % Lymphocytes 30 %    % Monocytes 10 %    % Eosinophils 3 %    % Basophils 1 %    % Immature Granulocytes 0 %    Neutrophils 3.58 1.80 - 7.00 10*3/uL    Absolute Lymphocyte Count 1.98 1.00 - 4.80 10*3/uL    Monocytes 0.68 0.00 - 0.80 10*3/uL    Absolute Eosinophil Count 0.18 0.00 - 0.50 10*3/uL    Basophils 0.08 0.00 - 0.20 10*3/uL    Immature Granulocytes 0.01 0.00 - 0.05 10*3/uL    Nucleated RBC 0.00 0.00 10*3/uL    % Nucleated RBC 0 %   2. Ferritin   Result Value Ref [facility]    Ferritin 13 10 - 180 ng/mL   3.  Iron Binding Capacity and Total Iron   Result Value Ref [facility]    Transferrin 257 192 - 382 mg/dL    Iron, SRM 134 31 - 171 ug/dL    Total Iron Binding Capacity 360 270 - 535 ug/dL    Transferrin Saturation 37 10 - 45 %       A/P:  (F98.8) Attention deficit disorder, unspecified hyperactivity presence  (primary encounter diagnosis):  Relatively stable on current medications, patient would like dose adjustment due to continued difficulties with focus.  Refilled bupropion printed prescription so patient can check for specific brand.  Patient plans to follow up with Nelva Bush for discussion of dose adjustment with next refills  Plan: Lisdexamfetamine Dimesylate 70 MG Oral Cap,         buPROPion 100 MG Oral Tab, DISCONTINUED:         buPROPion 100 MG Oral Tab, DISCONTINUED:         Lisdexamfetamine Dimesylate 70 MG Oral Cap      (R79.0) Low ferritin: Somewhat improved with current dose of iron, now in normal range.  Recommended she continue to a room for twice a day dosing.  Consider repeat labs in 3-6 months

## 2017-09-26 NOTE — Progress Notes (Signed)
Reason for visit: F/u Labs      Cervical screening/PAP:UTD  Mammo: N/A  Colon Screen: N/A  Have you seen a specialist since your last visit: NO   Name and location and date.     HM Due:   Health Maintenance   Topic Date Due    Pneumococcal Vaccine: Pediatrics (0-5 years) and At-Risk Patients (6-64 years) (1 of 1 - PPSV23) 09/17/1981    Depression Screening (PHQ-2)  03/14/2018    Cervical Cancer Screening  01/06/2021    Tetanus Vaccine  01/18/2022    Pneumococcal Vaccine: 65+ years (1 of 2 - PCV13) 09/17/2040    Influenza Vaccine  Completed    HIV Screening  Completed              Future Appointments   Date Time Provider [place]   09/26/2017  8:00 AM Debelak, Burna Mortimer, MD [facility]

## 2017-12-06 ENCOUNTER — Encounter (INDEPENDENT_AMBULATORY_CARE_PROVIDER_SITE_OTHER): Payer: Self-pay | Admitting: Family

## 2017-12-06 ENCOUNTER — Ambulatory Visit (INDEPENDENT_AMBULATORY_CARE_PROVIDER_SITE_OTHER): Payer: PRIVATE HEALTH INSURANCE | Admitting: Family

## 2017-12-06 VITALS — BP 118/75 | HR 100 | Temp 99.7°F | Resp 12 | Ht 64.57 in | Wt 128.2 lb

## 2017-12-06 DIAGNOSIS — F902 Attention-deficit hyperactivity disorder, combined type: Secondary | ICD-10-CM

## 2017-12-06 DIAGNOSIS — E063 Autoimmune thyroiditis: Secondary | ICD-10-CM

## 2017-12-06 DIAGNOSIS — R718 Other abnormality of red blood cells: Secondary | ICD-10-CM

## 2017-12-06 DIAGNOSIS — R4189 Other symptoms and signs involving cognitive functions and awareness: Secondary | ICD-10-CM

## 2017-12-06 DIAGNOSIS — F988 Other specified behavioral and emotional disorders with onset usually occurring in childhood and adolescence: Secondary | ICD-10-CM

## 2017-12-06 DIAGNOSIS — Z6821 Body mass index (BMI) 21.0-21.9, adult: Secondary | ICD-10-CM

## 2017-12-06 MED ORDER — LISDEXAMFETAMINE DIMESYLATE 70 MG OR CAPS
70.0000 mg | ORAL_CAPSULE | Freq: Every morning | ORAL | 0 refills | Status: DC
Start: 2018-01-05 — End: 2018-03-15

## 2017-12-06 MED ORDER — AMPHETAMINE-DEXTROAMPHETAMINE 5 MG OR TABS
ORAL_TABLET | ORAL | 0 refills | Status: DC
Start: 2018-02-04 — End: 2018-03-15

## 2017-12-06 MED ORDER — LISDEXAMFETAMINE DIMESYLATE 70 MG OR CAPS
70.0000 mg | ORAL_CAPSULE | Freq: Every morning | ORAL | 0 refills | Status: DC
Start: 2018-02-04 — End: 2018-03-15

## 2017-12-06 MED ORDER — AMPHETAMINE-DEXTROAMPHETAMINE 5 MG OR TABS
ORAL_TABLET | ORAL | 0 refills | Status: DC
Start: 2018-01-05 — End: 2018-03-15

## 2017-12-06 MED ORDER — LISDEXAMFETAMINE DIMESYLATE 70 MG OR CAPS
70.0000 mg | ORAL_CAPSULE | Freq: Every morning | ORAL | 0 refills | Status: DC
Start: 2017-12-06 — End: 2018-03-15

## 2017-12-06 MED ORDER — AMPHETAMINE-DEXTROAMPHETAMINE 5 MG OR TABS
ORAL_TABLET | ORAL | 0 refills | Status: DC
Start: 2017-12-06 — End: 2018-03-15

## 2017-12-06 NOTE — Patient Instructions (Addendum)
First recommending the  NOW brand of THYROID Energy (available at most pharmacy including [facility] with best price).  Two tabs twice daily with empty stomach.  22 cents per pill.    Contains ingredients to boost a person's metabolism naturally.  Recheck thyroid level in 8 weeks after starting Thyroid energy.  I have ordered labs. We can decide then if further treatment is needed with a prescription.    B12 daily.     Labs today and will follow up with [facility].        Vyvanse likely no longer working for her in current stressful situation, lower metabolism and possibly less exercise.     Discussed importance of boosting metabolism.   Added small amount of adderall for after lunch  May want to switch to Focalin if boosting metabolism does not work.    Labs today and will follow up  4 weeks or sooner if needed

## 2017-12-06 NOTE — Progress Notes (Signed)
nnCHIEF Complaint(s)  Lisa Flynn is a 42 year old Female here with chief complaint(s):     1) She is here to adjust her ADHD medication. The Vyvanse did work for [employer] but then stopped working. She is paying attention less to work as the day progresses. Wears off much faster. Wears off around 12:30 or so. She has been on it for about 5 yrs.     Other changes: Her parents moved in with her. While this is helpful for her kid's care, it is a financial burden to her. She has more to feed and care for as a single mother.   And She drinks less alcohol now. Not smoking.     Starts her day at 7 am.  Takes Vyvanse before leaving for work. Then she has iron and juice. Lunch is around 1 pm and typically healthy.      She has missed meetings  Denies anxiety or depression  Not fatigued in the afternoon.  No napping at work.  She just thinks the medicine as stopped.     Allergies-Patient has no known allergies.    Review of Systems:  Constitution: sleeping well. She works in the yard a lot and walks daily with a friend.  She feels rested in the morning.   She is eating breakfast.  Staying hydrated.  Denies new aches and pains.  Sleep is the same and she agrees it is restorative most of the time.      Social History     Socioeconomic History    Marital status: Divorced     Spouse name: Pavel    Number of children: 0    Years of education: Not on file    Highest education level: Not on file   Occupational History    Occupation: assembly   Airline pilot strain: Not on file    Food insecurity:     Worry: Not on file     Inability: Not on file    Transportation needs:     Medical: Not on file     Non-medical: Not on file   Tobacco Use    Smoking status: Former Smoker     Packs/day: 0.25     Years: 15.00     Pack years: 3.75     Types: Cigarettes, E-Cigarettes     Last attempt to quit: 10/10/2017     Years since quitting: 0.1    Smokeless tobacco: Never Used    Tobacco comment: Quit 12/17,  (quit 08/2010, started again last week due to stress 04/2011)   Substance and Sexual Activity    Alcohol use: Yes     Alcohol/week: 2.0 - 3.0 standard drinks     Types: 2 - 3 Shots of liquor per week     Frequency: 2-3 times a week     Drinks per session: 1 or 2     Comment:      Drug use: No    Sexual activity: Yes     Partners: Male     Birth control/protection: IUD   Lifestyle    Physical activity:     Days per week: Not on file     Minutes per session: Not on file    Stress: Not on file   Relationships    Social connections:     Talks on phone: Not on file     Gets together: Not on file     Attends religious service:  Not on file     Active member of club or organization: Not on file     Attends meetings of clubs or organizations: Not on file     Relationship status: Not on file    Intimate partner violence:     Fear of current or ex partner: Not on file     Emotionally abused: Not on file     Physically abused: Not on file     Forced sexual activity: Not on file   Other Topics Concern    Not on file   Social History Narrative    01/2011:  Divorced.  In an intermittent relationship.        02/2012:  No changes.  18 yo nephew living with them       Patient Active Problem List   Diagnosis    Alopecia areata    Headache    Presence of intrauterine contraceptive device    CONTROLLED SUBSTANCES PLAN ON FILE    Attention deficit disorder    Subclinical hyperthyroidism    Thyroid antibody positive    Low ferritin     Current Outpatient Medications   Medication Sig Dispense Refill    Acetaminophen (TYLENOL 8 HOUR) 650 MG OR TBCR 1-2 TABLETS EVERY 8 HOURS AS NEEDED 180 Tab 0    buPROPion 100 MG Oral Tab Take 1 tablet (100 mg) by mouth 2 times a day. (Patient not taking: Reported on 12/06/2017) 180 tablet 1    Cholecalciferol (VITAMIN D3) 1000 units Oral Tab Take 2 tablets (2,000 Units) by mouth daily. 90 tablet 3    Ferrous Sulfate 324 (65 Fe) MG Oral Tab EC Take 1 tablet (324 mg) by mouth 2 times a  day. 60 tablet 11    Lisdexamfetamine Dimesylate (VYVANSE) 60 MG Oral Cap Take 60 capsules by mouth daily.      Lisdexamfetamine Dimesylate 70 MG Oral Cap Take 1 capsule (70 mg) by mouth every morning. 30 capsule 0    Unclassified (DME PRESCRIPTION) Please dispense supply noted below 1 Device 5     No current facility-administered medications for this visit.          OBJECTIVE:  Constitution: patient appears engaged, vitals stable with elevated heart rate (on stimulant). Alert. Low body weight. No distress  Heart Regular rate and rhythm, S1 and S2 present without murmur  General appearance: casually dressed  Behavior/Activity: calm and cooperative  Speech: clear, coherent, fluent.  Affect: normal affect  Mood: normal mood.    Thought form: normal: logical, coherent, goal-directed.  Thought content: normal  Attention/concentration: alert   Memory: age appropriate  Insight/Judgment: good insight and judgment      Health Maintenance reviewed - reviewed, reminded and ordered.    MEDICAL DECISION MAKING:  # of diagnoses or management options: limited (b)  Amount and/or complexity of data to be reviewed: limited (b)  Risk of complications and/or morbidity or mortality: low (b)    ASSESSMENT AND PLAN  I spent a total time of 25 minutes face-to-face with the patient, of which more than 50% was spent counseling and coordinating care as outlined in this note.    Stop current b12 supplement.  START Thyro - active complex available on [employer]. This has b12 as well.     Vyvanse likely no longer working for her in current stressful situation, lower metabolism and possibly less exercise.     Discussed importance of boosting metabolism. See avs  Added small amount of adderall for after  lunch  May want to switch to Focalin if boosting metabolism does not work.    Labs today and will follow up  4 weeks or sooner if needed      (R41.89) Cognitive change  (primary encounter diagnosis)  Plan: Thyroid Stimulating Hormone, Folate and  Vitamin        B12, amphetamine-dextroamphetamine 5 MG Oral         Tablet    (E06.3) Hashimoto's thyroiditis  Plan: Thyroid Stimulating Hormone          (R71.8) Elevated MCV  Plan: Folate and Vitamin B12            (F90.2) ADHD (attention deficit hyperactivity disorder), combined type  Plan: amphetamine-dextroamphetamine (ADDERALL) 5 MG         Oral Tablet, amphetamine-dextroamphetamine         (ADDERALL) 5 MG Oral Tablet, Lisdexamfetamine         Dimesylate 70 MG Oral Capsule, Lisdexamfetamine        Dimesylate 70 MG Oral Capsule, Lisdexamfetamine        Dimesylate 70 MG Oral Capsule    Follow up 1 month sooner if needed.     Nelva Bush, ARNP, PhD        Orders per documented in this encounter  Patient instructions per documented in this encounter          There are no Patient Instructions on file for this visit.

## 2017-12-06 NOTE — Progress Notes (Signed)
HEALTH MAINTENANCE:    Immunizations: UTD  The patient has no Health Maintenance topics of status Overdue, Due On, or Due Soon  Cervical Cancer Screening:    Health Maintenance   Topic Date Due    Cervical Cancer Screening  01/06/2021     There are no Chlamydia Screening preventive care reminders to display for this patient.    Breast Cancer Screening:    There are no Breast Cancer Screening preventive care reminders to display for this patient.    Colorectal Cancer Screening:    There are no Colorectal Cancer Screening (Colonoscopy) preventive care reminders to display for this patient.  There are no Colorectal Cancer Screening (FOBT/FIT) preventive care reminders to display for this patient.    Diabetic Foot Exam:    There are no Diabetes Foot Exam preventive care reminders to display for this patient.    Diabetic Eye Exam:    There are no Diabetes Eye Exam preventive care reminders to display for this patient.    Reviewed eCare status with Patient:  YES    Have you seen a specialist since your last visit? No      Future Appointments   Date Time Provider [place]   12/06/2017  2:15 PM Rocky Crafts, ARNP [facility]

## 2017-12-07 LAB — THYROID STIMULATING HORMONE: Thyroid Stimulating Hormone: 2.669 u[IU]/mL (ref 0.400–5.000)

## 2017-12-07 LAB — FOLATE & VITAMIN B12
Folate, SRM: 6 ng/mL (ref >5.8)
Vitamin B12 (Cobalamin): 1020 pg/mL — ABNORMAL HIGH (ref 180–914)

## 2017-12-07 NOTE — Result Encounter Note (Signed)
Your lab results are normal.   Please let me know if you have any question.

## 2017-12-08 ENCOUNTER — Encounter (INDEPENDENT_AMBULATORY_CARE_PROVIDER_SITE_OTHER): Payer: Self-pay | Admitting: Family

## 2018-01-25 ENCOUNTER — Ambulatory Visit (INDEPENDENT_AMBULATORY_CARE_PROVIDER_SITE_OTHER): Payer: PRIVATE HEALTH INSURANCE

## 2018-01-25 DIAGNOSIS — Z23 Encounter for immunization: Secondary | ICD-10-CM

## 2018-01-25 NOTE — Progress Notes (Signed)
Vaccine Screening Questions    Interpreter: No    1. Are you allergic to Latex? NO    2.  Have you had a serious reaction or an allergic reaction to a vaccine?  NO    3.  Currently have a moderate or severe illness, including fever?  NO    4.  Ever had a seizure or any neurological problem associated with a vaccine? (DTaP/TDaP/DTP pertinent) NO    5.  Is patient receiving any live vaccinations today? (Varicella-Chickenpox, MMR-Measles/Mumps/Rubella, Zoster-Shingles, Flumist, Yellow Fever) NOTE: oral rotavirus is exempt  NO    If YES to any of the questions above - Do NOT give vaccine.  Consult with RN or provider in clinic.  (#5 can be YES if all Live vaccine questions are answered NO)    If NO to all questions above - Patient may receive vaccine.    6.  Do you need to receive the Flu vaccine today? YES - Additional Flu Questions  Flu Vaccine Screening Questions:    Ever had a serious allergic reaction to eggs?  NO    Ever had Guillain-Barre syndrome associated with a vaccine? NO    Less than 6 months old? NO    If YES to any of the Flu questions above - NO Flu Vaccine to be given.  Patient may consult provider as needed.    If NO to all questions above - Patient may receive Flu Shot (IM)    Is the patient requesting Flumist? NO    If between 6 months and 12 years of age, was flu vaccine received last year?  no  If NO to above question:   Children who are receiving influenza vaccine for the first time - administer 2 doses of the current influenza vaccine (separated by at least 4 weeks).          All patients are encouraged to wait 15 minutes before leaving after receiving any vaccine.    VIS given 01/25/2018 by Odis Luster, CMA.

## 2018-02-23 ENCOUNTER — Other Ambulatory Visit (INDEPENDENT_AMBULATORY_CARE_PROVIDER_SITE_OTHER): Payer: Self-pay | Admitting: Family

## 2018-02-23 DIAGNOSIS — F902 Attention-deficit hyperactivity disorder, combined type: Secondary | ICD-10-CM

## 2018-02-23 DIAGNOSIS — R4189 Other symptoms and signs involving cognitive functions and awareness: Secondary | ICD-10-CM

## 2018-02-28 ENCOUNTER — Encounter (INDEPENDENT_AMBULATORY_CARE_PROVIDER_SITE_OTHER): Payer: Self-pay | Admitting: Family Medicine

## 2018-02-28 NOTE — Telephone Encounter (Signed)
Please offer patient a telemedicine visit with me or she can see her provider at [facility].

## 2018-03-01 NOTE — Telephone Encounter (Signed)
Mailbox not set up to leave message  [employer] message.

## 2018-03-15 ENCOUNTER — Encounter (INDEPENDENT_AMBULATORY_CARE_PROVIDER_SITE_OTHER): Payer: Self-pay | Admitting: Family Medicine

## 2018-03-15 ENCOUNTER — Ambulatory Visit (INDEPENDENT_AMBULATORY_CARE_PROVIDER_SITE_OTHER): Payer: Self-pay

## 2018-03-15 ENCOUNTER — Ambulatory Visit (INDEPENDENT_AMBULATORY_CARE_PROVIDER_SITE_OTHER): Payer: PRIVATE HEALTH INSURANCE | Admitting: Family Medicine

## 2018-03-15 VITALS — BP 116/77 | HR 97 | Temp 97.5°F | Resp 16 | Ht 64.5 in | Wt 126.0 lb

## 2018-03-15 DIAGNOSIS — Z30430 Encounter for insertion of intrauterine contraceptive device: Secondary | ICD-10-CM

## 2018-03-15 DIAGNOSIS — F902 Attention-deficit hyperactivity disorder, combined type: Secondary | ICD-10-CM

## 2018-03-15 DIAGNOSIS — R4189 Other symptoms and signs involving cognitive functions and awareness: Secondary | ICD-10-CM

## 2018-03-15 DIAGNOSIS — Z01812 Encounter for preprocedural laboratory examination: Secondary | ICD-10-CM

## 2018-03-15 DIAGNOSIS — Z30433 Encounter for removal and reinsertion of intrauterine contraceptive device: Secondary | ICD-10-CM

## 2018-03-15 LAB — PR URINE PREGNANCY TEST HCG, ONSITE: Pregnancy (HCG) (UWNC), URN: NEGATIVE

## 2018-03-15 MED ORDER — AMPHETAMINE-DEXTROAMPHETAMINE 5 MG OR TABS
ORAL_TABLET | ORAL | 0 refills | Status: DC
Start: 2018-05-16 — End: 2018-06-05

## 2018-03-15 MED ORDER — LISDEXAMFETAMINE DIMESYLATE 70 MG OR CAPS
70.0000 mg | ORAL_CAPSULE | Freq: Every morning | ORAL | 0 refills | Status: DC
Start: 2018-04-15 — End: 2018-06-05

## 2018-03-15 MED ORDER — LISDEXAMFETAMINE DIMESYLATE 70 MG OR CAPS
70.0000 mg | ORAL_CAPSULE | Freq: Every morning | ORAL | 0 refills | Status: DC
Start: 2018-03-15 — End: 2018-06-05

## 2018-03-15 MED ORDER — IBUPROFEN 200 MG OR TABS
600.0000 mg | ORAL_TABLET | Freq: Once | ORAL | Status: AC
Start: 2018-03-15 — End: 2018-03-15
  Administered 2018-03-15: 600 mg via ORAL

## 2018-03-15 MED ORDER — AMPHETAMINE-DEXTROAMPHETAMINE 5 MG OR TABS
ORAL_TABLET | ORAL | 0 refills | Status: DC
Start: 2018-04-15 — End: 2018-06-05

## 2018-03-15 MED ORDER — AMPHETAMINE-DEXTROAMPHETAMINE 5 MG OR TABS
ORAL_TABLET | ORAL | 0 refills | Status: DC
Start: 2018-03-15 — End: 2018-06-05

## 2018-03-15 MED ORDER — LISDEXAMFETAMINE DIMESYLATE 70 MG OR CAPS
70.0000 mg | ORAL_CAPSULE | Freq: Every morning | ORAL | 0 refills | Status: DC
Start: 2018-05-16 — End: 2018-06-05

## 2018-03-15 NOTE — Progress Notes (Signed)
PROCEDURE NOTE: Intrauterine Device Removal & Reinsertion    Lisa Flynn is a 42 year old  [id number] who presents today for IUD removal and subsequent ParaGard T380A IUD IUD insertion.    A visit for counseling and discussion of this procedure took place today. Cervical assay for chlamydia and gonorrhea was not done. Pap smear was not done. A urine pregnancy test was done and the result was negative. Patient's last menstrual period was 03/01/2018 (exact date).. The patient was counseled that a negative pregnancy test does not rule out the possibility of early pregnancy, depending on timing of recent intercourse, current contraceptive use, and other clinical factors: NO:      ParaGard T380A IUD   Expiration date: 2029   Lot number: [id number]      Indication: Desire for contraception  Allergic to anesthetic, latex, iodine: NO    Patient's medical history reviewed, and there are no known contraindications to IUD use: Yes    The risks and benefits of use of the IUD were discussed with the patient.    Risks include, but are not limited to:  1. The remote chance of contraceptive failure  2. Increased risk of ectopic pregnancy, infection, and/or miscarriage if pregnancy occurs  3. Risk of PID if exposed to sexually transmitted diseases  4. Risk of heavier periods and dysmenorrhea (Copper IUD Only)  5. Risk of irregular periods and amenorrhea (Levonorgestrel IUD Only)  6. Risk of embedment of the IUD into the endometrium and consequent difficulty in removal  7. Risk of uterine perforation, possible damage to intraabdominal organs and/or need for surgical intervention       Correct patient NAME and ID YES   Correct PROCEDURE YES   Correct EQUIPMENT and SETTINGS YES   Completed CONSENT YES, Date: 03/15/2018          The patient was placed in the dorsal lithotomy position. Bimanual   exam showed the uterus to be in the neutral position. Speculum placed: Cervix visualized and string seen.. Strings were grasped  with ring forceps, gentle traction applied, and IUD was successfully removed. Device was intact.      The cervix was then prepped with povidone iodine and a a single tooth tenaculum was applied to the anterior lip of the cervix. A sterile uterine sound was used to sound the uterus to a depth of 8 cm using no-touch technique. A ParaGard T380A IUD intrauterine device was placed to the fundus using the prepackaged inserter and no-touch technique. The IUD strings were trimmed to a length of 3 cm from the cervical os.    The patient tolerated the procedure well. Complications: none  Procedure performed by Clinician    Postprocedure education was performed, including the following points:   1. You can manually check your IUD strings and were taught how to do this, but this is not required.   2. If you are concerned you are pregnant, call your doctor.   3. Seek immediate care for signs of infection, including pelvic pain, vaginal discharge or excessive or intermenstrual bleeding, and fever.   4. If you have heavy cramping and are concerned that your IUD has been expelled, please call your provider.   5. The PARAGARD T380A IUD should be removed 10-12 years after insertion (past 10 years discussed with patient that it was off-label use).   6. It is still necessary to return for periodic check-ups, pap smears, etc at a time interval recommended by your provider.   7.  Take ibuprofen as needed for cramping.   8. If your new IUD contains hormones, use a backup form of birth control (e.g. condoms) for the first 7 days after insertion unless the insertion was within 7 days of the start of your period. If your new IUD does not contain hormones, it is effective immediately.   9. Condoms are recommended for patients at risk for sexually transmitted infections.   Patient instructed to return for follow-up as needed.       Pt also requesting refill on stable doses of Adderrall.  Pt happy with focus at these doses and no side effects  such as weight loss, palpitations, insomnia.    rx refilled. F/u in 3 months for wellness visit.      Wt Readings from Last 10 Encounters:   03/15/18 126 lb (57.2 kg)   12/06/17 128 lb 3.2 oz (58.2 kg)   09/26/17 137 lb (62.1 kg)   07/04/17 128 lb 3.2 oz (58.2 kg)   03/14/17 129 lb (58.5 kg)   01/25/17 124 lb (56.2 kg)   10/28/16 125 lb 3.2 oz (56.8 kg)   04/15/16 122 lb 9.6 oz (55.6 kg)   01/07/16 132 lb 6.4 oz (60.1 kg)   07/01/15 124 lb (56.2 kg)

## 2018-03-15 NOTE — Progress Notes (Signed)
Reason for visit: IUD removed/replaced      Cervical screening/PAP:UTD  Mammo: UTD  Colon Screen: uTD  Have you seen a specialist since your last visit: NO        HM Due:   Health Maintenance   Topic Date Due    Depression Screening (PHQ-2)  09/27/2018    Cervical Cancer Screening  01/06/2021    Tetanus Vaccine  01/18/2022    Influenza Vaccine  Completed    HIV Screening  Completed    Pneumococcal Vaccine: Pediatrics (0-5 years) and At-Risk Patients (6-64 years)  Discontinued              Future Appointments   Date Time Provider [place]   03/15/2018  9:30 AM Debelak, Burna Mortimer, MD [facility]

## 2018-03-15 NOTE — Patient Instructions (Addendum)
It was a pleasure to see you in clinic today. Your Medical Assistant was: Loletha Grayer can schedule an appointment to see [place] by calling  [phone] or via eCare.     If labs were ordered today the results are expected to be available via eCare 5 days later. Otherwise, result letters are mailed 7-10 days after your tests are completed. If your physician needs to change your care based on your results, you will receive a phone call to notify you. If you haven't heard from him/her and it has been more than 10 days please give [place] a call.     If you are not yet signed up for eCare, please speak with a team member at the front desk. eCare enrollment will allow you to make appointments online, view test results and obtain a copy of our After Visit Summary    Thank you for choosing [facility].        After Your IUD Is Placed:    What can I expect?  . It is normal to have unusual bleeding, spotting, and mild cramping for 3 to 6 months after your IUD is placed. These usually improve over time. If your bleeding or cramping is not getting better, or if you have heavy bleeding or strong pelvic pain, call our office right away.   . Ibuprofen (Advil, Motrin, and others) helps decrease pain and cramping. You can buy ibuprofen at any drugstore without a prescription. Take 3 tablets (200 mg each) by mouth every 6 hours as needed for pain and cramping. Do not take more than 12 tablets in 24 hours. Be sure to take ibuprofen with food.    What problems should I watch for?   Marland Kitchen The IUD can sometimes fall out (be expelled from the uterus).   ? If you have heavy bleeding or pain, check with your fingers to make sure you can still feel the IUD strings. Do not pull on the strings, since you could remove the IUD. If you cannot feel the strings, call our office.   ? If you are not having symptoms that concern you, you do not need to check your IUD strings.   . The IUD is a very good form of birth control, but no  form of birth control is perfect. If you have nausea, breast tenderness, pelvic pain, or unexpected bleeding, you may be pregnant. If you have these signs, call our office.  . Women who have an IUD can get pelvic infections. Call us right away if you have pain in your pelvis or lower abdomen, unusual vaginal discharge, or a fever higher than 101F (38.3C), if it is not caused by another illness.   . See your healthcare provider for yearly exams, and have all routine screening tests. An IUD does not protect against sexually transmitted infections.     When should the IUD be removed?  An IUD can be removed at any time. IUDs are approved by the Food and Drug Administration (FDA) for up to a certain number of years, but in some cases the IUD can be used for a longer period of time. It is important that you talk about this with your healthcare provider.   Here are the number of years each IUD can be used:  Marland Kitchen Mirena IUD: 5 to 7 years after it is placed (approved by the FDA for 5 years)  . Paragard IUD: 10 to 45  years after it is placed (approved by the FDA for 10 years)  . Skyla IUD: 3 years after it is placed (approved by the FDA for 3 years)  . Liletta IUD: 5 to 7 years after it is placed (approved by the FDA for 5 years)  . Verdia Kuba IUD: 5 years after it is placed (approved by the FDA for 5 years)    When should I call my healthcare provider?  Call your healthcare provider if you:  . Have heavy bleeding  . Feel strong or sharp pain in your pelvis or lower abdomen  . Have vaginal discharge that smells bad  . Feel pain when you have sex  . Cannot find the IUD strings  . Have a fever above 101F (38.3C) that you cannot explain  . Think you might be pregnant  . Want to have the IUD removed    Questions?  . Keep these instructions so you can refer to them as needed.  . Please call your healthcare provider if you have any questions or concerns about your IUD.

## 2018-06-05 ENCOUNTER — Encounter (INDEPENDENT_AMBULATORY_CARE_PROVIDER_SITE_OTHER): Payer: Self-pay | Admitting: Family Medicine

## 2018-06-05 ENCOUNTER — Ambulatory Visit (INDEPENDENT_AMBULATORY_CARE_PROVIDER_SITE_OTHER): Payer: PRIVATE HEALTH INSURANCE | Admitting: Family Medicine

## 2018-06-05 DIAGNOSIS — F902 Attention-deficit hyperactivity disorder, combined type: Secondary | ICD-10-CM

## 2018-06-05 DIAGNOSIS — Z01419 Encounter for gynecological examination (general) (routine) without abnormal findings: Secondary | ICD-10-CM

## 2018-06-05 MED ORDER — LISDEXAMFETAMINE DIMESYLATE 70 MG OR CAPS
70.0000 mg | ORAL_CAPSULE | Freq: Every morning | ORAL | 0 refills | Status: DC
Start: 2018-07-18 — End: 2018-09-18

## 2018-06-05 MED ORDER — AMPHETAMINE-DEXTROAMPHETAMINE 5 MG OR TABS
ORAL_TABLET | ORAL | 0 refills | Status: DC
Start: 2018-06-17 — End: 2018-12-19

## 2018-06-05 MED ORDER — AMPHETAMINE-DEXTROAMPHETAMINE 5 MG OR TABS
ORAL_TABLET | ORAL | 0 refills | Status: DC
Start: 2018-07-18 — End: 2018-09-18

## 2018-06-05 MED ORDER — LISDEXAMFETAMINE DIMESYLATE 70 MG OR CAPS
70.0000 mg | ORAL_CAPSULE | Freq: Every morning | ORAL | 0 refills | Status: DC
Start: 2018-06-17 — End: 2018-12-19

## 2018-06-05 MED ORDER — AMPHETAMINE-DEXTROAMPHETAMINE 5 MG OR TABS
ORAL_TABLET | ORAL | 0 refills | Status: DC
Start: 2018-08-17 — End: 2018-09-18

## 2018-06-05 MED ORDER — LISDEXAMFETAMINE DIMESYLATE 70 MG OR CAPS
70.0000 mg | ORAL_CAPSULE | Freq: Every morning | ORAL | 0 refills | Status: DC
Start: 2018-08-17 — End: 2018-09-18

## 2018-06-05 NOTE — Patient Instructions (Signed)
It was a pleasure to see you in clinic today. Your Medical Assistant was: Loletha Grayer can schedule an appointment to see [place] by calling  [phone] or via eCare.     If labs were ordered today the results are expected to be available via eCare 5 days later. Otherwise, result letters are mailed 7-10 days after your tests are completed. If your physician needs to change your care based on your results, you will receive a phone call to notify you. If you haven't heard from him/her and it has been more than 10 days please give [place] a call.     If you are not yet signed up for eCare, please speak with a team member at the front desk. eCare enrollment will allow you to make appointments online, view test results and obtain a copy of our After Visit Summary    Thank you for choosing [facility].

## 2018-06-05 NOTE — Progress Notes (Signed)
Lisa Flynn is a 43 year old female here today for a preventive health visit.   Other problems or concerns today:   1.  ADD:  Requesting refill on Adderall.  Happy with doses. Weight is stable.      GYN HISTORY  OB History   Gravida Para Term Preterm AB Living   1 1 1  0 0 1   SAB TAB Ectopic Multiple Live Births   0 0 0 0 0       Currently having periods: YES  Patient's last menstrual period was 05/13/2018 (exact date).   Periods are regular q 28-30 days, lasting 7 days.    Crampy, lower abdominal pain during periods: none  Cyclic symptoms: none  Intermenstrual bleeding, spotting,or discharge: No   Last pap: 2017  Pap history: no history of abnormal paps  Other gyn history: none    SEXUAL HISTORY  Sexual activity: yes, single partner, contraception - IUD: ParaGard T380A IUD  Age at first intercourse: Not asked today  History of STDs: none known  Number of sex partners in the past year: 1  Last STD check: not asked   New partner(s) since last STD check: No    Sexual concerns: No    History of sexual or physical abuse: Not asked today  History of any other forms of abuse (e.g. verbal, financial): Not asked today  Has the patient been hit, kicked, punched, or otherwise hurt by someone within the past year: Not asked today      CANCER SCREENING  Family history of colon cancer: NO  Family history of uterine or ovarian cancer: NO  Family history of breast cancer: NO  Prior mammogram: NO  History of abnormal mammogram: N/A    LIFESTYLE  Current dietary habits: healthy diet in general minimal processed food, minimal eating out  Calcium: dietary sources only  Current exercise habits: generally active >21mn, ~ 2-3 days/week  Regular seat belt use: N/A  Substance use:  reports that she quit smoking about 7 months ago. Her smoking use included cigarettes and e-cigarettes. She has a 3.75 pack-year smoking history. She has never used smokeless tobacco. She reports current alcohol use of about 2.0 - 3.0 standard  drinks of alcohol per week. She reports that she does not use drugs.  Exposure to hazardous materials: Not asked today  Guns in the house: Not asked today    Review Of Systems  Regular dental exams: yes  Constitutional: Negative for fatigue fever chills   Respiratory: Negative for dyspnea at rest cough wheezing   Cardiovascular: Negative for chest pain or pressure at rest or during exercise Denies   GI: Negative for nausea vomiting diarrhea constipation   Neurologic: deferred   Psych: Negative for any psychological problems    EXAM:  BP (P) 114/75   Pulse (P) 75   Temp (P) 97.5 F (36.4 C) (Oral)   Resp (P) 20   Ht (P) 5' 4.25" (1.632 m)   Wt (P) 126 lb (57.2 kg)   LMP 05/13/2018 (Exact Date)   SpO2 (P) 97%   BMI (P) 21.46 kg/m   Body mass index is 21.46 kg/m (pended).  General: healthy, alert, no distress  Head: Alopecia.  Normocephalic. No masses, lesions, tenderness or abnormalities  supple. No adenopathy. Thyroid symmetric, normal size, without nodules  Lungs: CTAB no wheezing  Heart: normal rate, regular rhythm and no murmurs, clicks, or gallops  Skin: Skin color, texture, turgor normal. No rashes or concerning lesions  Abdomen: soft, non-tender. BS normal. No masses or organomegaly  Neuro:  Grossly normal to observation, gait normal  Breasts: No obvious deformity or mass to inspection, nipples everted bilaterally, no skin lesion or nipple discharge, no mass palpated, no axillary lymphadenopathy, fibrocystic change without dominant masses  Pelvic exam: normal bartholin/skene/urethral meatus/anus., Vagina is rugated and well-estrogenized, cervix normal in appearance, no CMT, IUD strings visible, no bladder tenderness, uterus normal size, shape, and consistency, no adnexal masses or tenderness    ASSESSMENT/PLAN:  (Z01.419) Encounter for gynecological examination (general) (routine) without abnormal findings  (primary encounter diagnosis): routine advice as below, encouraged continuing healthy diet and  exercise until in a good routine of healthy diet and regular aerobic exercise 3-4 x per week for 30-60 min, or 10000 steps per day.       (F90.2) ADHD (attention deficit hyperactivity disorder), combined type:  Stable on current doses.  Refilled x 3 months.  F/u in 3 months.   Plan: amphetamine-dextroAMPHetamine (Adderall) 5 MG         tablet, amphetamine-dextroAMPHetamine         (Adderall) 5 MG tablet,         amphetamine-dextroAMPHetamine 5 MG tablet,         lisdexamfetamine 70 MG capsule,         lisdexamfetamine 70 MG capsule,         lisdexamfetamine 70 MG capsule        Health Maintenance   Topic Date Due   . Depression Screening (PHQ-2)  06/06/2019   . Cervical Cancer Screening  01/06/2021   . DTaP, Tdap, and Td Vaccines (3 - Td) 01/18/2022   . Influenza Vaccine  Completed   . HIV Screening  Completed   . Hepatitis B Vaccine  Aged Out   . Pneumococcal Vaccine: Pediatrics (0-5 years) and At-Risk Patients (6-64 years)  Discontinued       Immunizations or studies due: none - up to date  Chlamydia screening offered: declined  HIV screening offered: declined  HPV vaccine offered: No    Preventive counseling: contraception  breast self-exam  skin cancer prevention/self-exam  immunizations  dental care  moderation in EtOH use/no drinking and driving  adequate calcium intake  vitamin D supplementation  proper exercise  Follow-up: 1 year

## 2018-06-05 NOTE — Progress Notes (Signed)
Reason for visit: Wellness      Cervical screening/PAP:UTD  Mammo: UTD  Colon Screen: UTD  Have you seen a specialist since your last visit: NO        HM Due:   Health Maintenance   Topic Date Due   . Depression Screening (PHQ-2)  03/16/2019   . Cervical Cancer Screening  01/06/2021   . DTaP, Tdap, and Td Vaccines (3 - Td) 01/18/2022   . Influenza Vaccine  Completed   . HIV Screening  Completed   . Hepatitis B Vaccine  Aged Out   . Pneumococcal Vaccine: Pediatrics (0-5 years) and At-Risk Patients (6-64 years)  Discontinued              Future Appointments   Date Time Provider [place]   06/05/2018  9:00 AM Debelak, Burna Mortimer, MD [facility]

## 2018-06-06 ENCOUNTER — Telehealth (INDEPENDENT_AMBULATORY_CARE_PROVIDER_SITE_OTHER): Payer: Self-pay | Admitting: Family Medicine

## 2018-06-09 NOTE — Telephone Encounter (Signed)
Please call pt and ask them to read their ecare messages.

## 2018-06-10 NOTE — Telephone Encounter (Signed)
Spoke with patient who acknowledged understanding.

## 2018-06-26 ENCOUNTER — Encounter (INDEPENDENT_AMBULATORY_CARE_PROVIDER_SITE_OTHER): Payer: Self-pay | Admitting: Family Medicine

## 2018-06-26 ENCOUNTER — Ambulatory Visit (INDEPENDENT_AMBULATORY_CARE_PROVIDER_SITE_OTHER): Payer: PRIVATE HEALTH INSURANCE | Admitting: Family Medicine

## 2018-06-26 VITALS — Wt 130.0 lb

## 2018-06-26 DIAGNOSIS — N3000 Acute cystitis without hematuria: Secondary | ICD-10-CM

## 2018-06-26 LAB — PR U/A AUTO W/MICRO, ONSITE
Bilirubin, Urine: NEGATIVE
Casts, URN: NEGATIVE /LPF
Glucose, Urine: NEGATIVE mg/dL
Ketones, URN: NEGATIVE mg/dL
Nitrite, URN: NEGATIVE
Specific Gravity, Urine: 1.02 (ref 1.005–1.030)
Urobilinogen, URN: 1 E.U./dL (ref 0.2–1.0)
WBC, URN: NEGATIVE /HPF (ref ?–5)
pH, URN: 8 (ref 5.0–8.0)

## 2018-06-26 MED ORDER — NITROFURANTOIN MONOHYD MACRO 100 MG OR CAPS
100.0000 mg | ORAL_CAPSULE | Freq: Two times a day (BID) | ORAL | 0 refills | Status: DC
Start: 2018-06-26 — End: 2019-01-01

## 2018-06-26 NOTE — Progress Notes (Signed)
Lisa Flynn is a 43 year old  female  who presents today for evaluation of dysuria, frequency and urgency over the last 2 days.  Was holding her urine at home while working and thinks this may have precipitated.      HPI:  Onset of symptoms: 2 days  Symptoms are: moderate.    ROS:  Pt denies: vaginal discharge, nausea, fever, chills and flank pain  Recent antibiotics:  No            Previous cystitis: Yes: years ago  Previous pyelonephritis:   No  Other bladder/kidney problems:  No  Diabetes:  No  Birth control method: abstinence    New partner in the last 3 months: No  Other significant partner history: No  Date of last sexual encounter: n/a  Date of last STD check: not asked  Review of patient's allergies indicates:  No Known Allergies  Current Outpatient Medications   Medication Sig Dispense Refill   . Acetaminophen (TYLENOL 8 HOUR) 650 MG OR TBCR 1-2 TABLETS EVERY 8 HOURS AS NEEDED 180 Tab 0   . [START ON 08/17/2018] amphetamine-dextroAMPHetamine (Adderall) 5 MG tablet One tablet 30 tablet 0   . [START ON 07/18/2018] amphetamine-dextroAMPHetamine (Adderall) 5 MG tablet One tablet every day at noon for ADHD. 30 tablet 0   . amphetamine-dextroAMPHetamine 5 MG tablet Take one tablet daily at noon if needed for ADHD. 30 tablet 0   . Cholecalciferol (VITAMIN D3) 1000 units Oral Tab Take 2 tablets (2,000 Units) by mouth daily. 90 tablet 3   . Ferrous Sulfate 324 (65 Fe) MG Oral Tab EC Take 1 tablet (324 mg) by mouth 2 times a day. 60 tablet 11   . [START ON 08/17/2018] lisdexamfetamine 70 MG capsule Take 1 capsule (70 mg) by mouth every morning. 30 capsule 0   . [START ON 07/18/2018] lisdexamfetamine 70 MG capsule Take 1 capsule (70 mg) by mouth every morning. 30 capsule 0   . lisdexamfetamine 70 MG capsule Take 1 capsule (70 mg) by mouth every morning. 30 capsule 0   . Paragard Copper IUD Insert 1 Intra Uterine Device into the uterus. Replace in 10 years.     . Unclassified (DME PRESCRIPTION) Please dispense  supply noted below 1 Device 5     No current facility-administered medications for this visit.        Meets guidelines for uncomplicated UTI: YES    EXAM:  BP (P) 123/84   Pulse (P) 95   Temp (P) 98.8 F (37.1 C) (Temporal)   Resp (P) 12   Wt 130 lb (59 kg)   LMP 06/05/2018 (Approximate)   SpO2 (P) 100%   BMI (P) 22.14 kg/m   Appearance: healthy, alert, no distress  CVAT: No  Abdomen: soft, non-tender. No masses or organomegaly  Pelvic exam: deferred    Office Visit on 06/26/18   1. U/A AUTO W/MICRO, ONSITE   Result Value Ref Range    Color, Urine YELLOW     Clarity, URN CLOUDY     Glucose, Urine NEG NEG mg/dL    Bilirubin, Urine NEG NEG    Ketones, URN NEG NEG mg/dL    Specific Gravity, Urine 1.020 1.005 - 1.030    Occult Blood, URN 1+ (A) NEG    pH, URN 8.0 5.0 - 8.0    Protein 1+ (A) NEG-TRACE mg/dL    Urobilinogen, URN 1.0 0.2 - 1.0 E.U./dL    Nitrite, URN NEG NEG    Leukocytes  TRACE (A) NEG    RBC, Urine 1+ (3-4) (A) NEG (0-2)    WBC, URN NEG 90-5) NEG(0-5) /HPF    Epithelial Cells, URN 1+ (6-30) /LPF    Bacteria 1+ (FEW) /HPF    Casts, URN NEG /LPF    Crystals, URN 3+ AMORPHOUS PHOSPHATES /LPF    Other         Assessment/Plan:  (N30.00) Acute cystitis without hematuria  (primary encounter diagnosis)  Plan: nitrofurantoin monohydrate macro 100 MG capsule  Plan: U/A AUTO W/MICRO, ONSITE, CANCELED: U/A AUTO         DIPSTICK ONLY, ONSITE      Patient education: UTI symptoms/etiology/treatment discussed, Preventive measures discussed, UTI handout given and See patient instructions

## 2018-06-26 NOTE — Patient Instructions (Signed)
It was a pleasure to see you in clinic today. Your Medical Assistant was: Loletha Grayer can schedule an appointment to see [place] by calling  [phone] or via eCare.     If labs were ordered today the results are expected to be available via eCare 5 days later. Otherwise, result letters are mailed 7-10 days after your tests are completed. If your physician needs to change your care based on your results, you will receive a phone call to notify you. If you haven't heard from him/her and it has been more than 10 days please give [place] a call.     If you are not yet signed up for eCare, please speak with a team member at the front desk. eCare enrollment will allow you to make appointments online, view test results and obtain a copy of our After Visit Summary    Thank you for choosing [facility].      Patient Education     Bladder Infection,Female (Adult)    Urine is normally doesn't have any bacteria in it. But bacteria can get into the urinary tract from the skin around the rectum. Or they can travel in the blood from elsewhere in the body. Once they are in your urinary tract, they can cause infection in the urethra (urethritis), the bladder (cystitis), or the kidneys (pyelonephritis).  The most common place for an infection is in the bladder. This is called a bladder infection. This is one of the most common infections in women. Most bladder infections are easily treated. They are not serious unless the infection spreads to the kidney.  The phrases "bladder infection," "UTI," and "cystitis" are often used to describe the same thing. But they are not always the same. Cystitis is an inflammation of the bladder. Themost common cause of cystitis is an infection.  Symptoms  The infection causes inflammation in the urethra and bladder. This causes many of the symptoms. The most common symptoms of a bladder infection are:   Pain or burning when urinating   Having to urinate more often than  usual   Urgent need to urinate   Only a small amount of urine comes out   Blood in urine   Abdominal discomfort. This is usually in the lower abdomen above the pubic bone.   Cloudy urine   Strong- or bad-smelling urine   Unable to urinate (urinary retention)   Unable to hold urine in (urinary incontinence)   Fever   Loss of appetite   Confusion (in older adults)  Causes  Bladder infections are not contagious. You can't get one from someone else, from a toilet seat, or from sharing a bath.  The most common cause of bladder infections is bacteria from the bowels. The bacteria get onto the skin around the opening of the urethra. From there, they can get into the urine and travel up to the bladder, causing inflammation and infection. This usually happens because of:   Wiping improperly after urinating. Always wipe from front to back.   Bowel incontinence   Pregnancy   Procedures such as having a catheter inserted   Older age   Not emptying your bladder. This can allow bacteria a chance to grow in your urine.   Dehydration   Constipation   Sex   Use of a diaphragm for birth control  Treatment  Bladder infections are diagnosed by a urine test. They are treated with antibiotics  and usuallyclear up quickly without complications. Treatment helps prevent a more serious kidney infection.  Medicines  Medicines can help in the treatment of a bladder infection:   Take antibiotics until they are used up, even if you feel better. It is important to finish them to make sure the infection has cleared.   You can use acetaminophen or ibuprofen for pain, fever, or discomfort, unless another medicine was prescribed. If you have chronic liver or kidney disease, talk with your healthcareprovider before usingthese medicines. Also talk with your provider if you've ever had a stomach ulcer or gastrointestinal bleeding, or are taking blood-thinner medicines.   If you are givenphenazopydridine to reduce burning with  urination, it will cause your urine to become a bright orange color. This can stain clothing.  Care and prevention  These self-care steps can help prevent future infections:   Drink plenty of fluids to prevent dehydration and flush out your bladder. Do thisunless you must restrict fluids for other health reasons, or your doctor told you not to.   Proper cleaning after going to the bathroom is important. Wipe from front to back after using the toilet to prevent the spread of bacteria.   Urinate more often. Don't try to hold urine in for a long time.   Wear loose-fitting clothes and cotton underwear. Avoid tight-fitting pants.   Improve your diet and prevent constipation. Eat more fresh fruit and vegetables, andfiber, and less junk and fatty foods.   Avoid sex until your symptoms are gone.   Avoid caffeine, alcohol, and spicy foods. These can irritate your bladder.   Urinate right after intercourse to flush out your bladder.   If you use birth control pills and have frequent bladder infections, discuss it with your doctor.  Follow-up care  Call your healthcare provider if all symptoms are not gone after 3 days of treatment. This is especially important if you have repeat infections.  If a culture was done, you will be told if your treatment needs to be changed. If directed, you can callto find out the results.  If X-rays were done, you will be told if the results will affect yourtreatment.  Call 911  Call 911 if any of the following occur:   Trouble breathing   Hard to wake up orconfusion   Fainting or loss of consciousness   Rapid heart rate  When to seek medical advice  Call your healthcare provider right away if any of these occur:   Fever of 100.78F (38.0C) or higher, or as directed by your healthcare provider   Symptoms are not betterby the third day of treatment   Back or belly (abdominal) pain that gets worse   Repeated vomiting, or unable to keep medicine down   Weakness or  dizziness   Vaginal discharge   Pain, redness, or swelling in the outer vaginal area (labia)  Date Last Reviewed: 01/11/2015   2000-2018 [employer]. [address], [place] [address]. All rights reserved. This information is not intended as a substitute for professional medical care. Always follow your healthcare professional's instructions.

## 2018-06-26 NOTE — Result Encounter Note (Signed)
[facility] 06/26/2018

## 2018-06-26 NOTE — Progress Notes (Signed)
Reason for visit: Possible UTI      Cervical screening/PAP:UTD  Mammo: UTD  Colon Screen: UTD  Have you seen a specialist since your last visit: NO      HM Due:   Health Maintenance   Topic Date Due   . Depression Screening (PHQ-2)  06/06/2019   . Cervical Cancer Screening  01/06/2021   . DTaP, Tdap, and Td Vaccines (3 - Td) 01/18/2022   . Influenza Vaccine  Completed   . HIV Screening  Completed   . Hepatitis B Vaccine  Aged Out   . Pneumococcal Vaccine: Pediatrics (0-5 years) and At-Risk Patients (6-64 years)  Discontinued              No future appointments.

## 2018-06-30 ENCOUNTER — Encounter (INDEPENDENT_AMBULATORY_CARE_PROVIDER_SITE_OTHER): Payer: Self-pay | Admitting: Family Medicine

## 2018-07-01 NOTE — Telephone Encounter (Signed)
Routing to PCP

## 2018-07-09 ENCOUNTER — Encounter (INDEPENDENT_AMBULATORY_CARE_PROVIDER_SITE_OTHER): Payer: Self-pay | Admitting: Family Medicine

## 2018-07-09 DIAGNOSIS — N3000 Acute cystitis without hematuria: Secondary | ICD-10-CM

## 2018-07-10 MED ORDER — CEPHALEXIN 500 MG OR CAPS
500.0000 mg | ORAL_CAPSULE | Freq: Two times a day (BID) | ORAL | 0 refills | Status: AC
Start: 2018-07-10 — End: 2018-07-15

## 2018-07-10 NOTE — Telephone Encounter (Signed)
Routing to PCP

## 2018-07-16 ENCOUNTER — Other Ambulatory Visit (INDEPENDENT_AMBULATORY_CARE_PROVIDER_SITE_OTHER): Payer: Self-pay | Admitting: Family Medicine

## 2018-07-16 DIAGNOSIS — R790 Abnormal level of blood mineral: Secondary | ICD-10-CM

## 2018-07-17 MED ORDER — FERROUS SULFATE 324 (65 FE) MG OR TBEC
DELAYED_RELEASE_TABLET | ORAL | 2 refills | Status: DC
Start: 2018-07-17 — End: 2018-10-17

## 2018-08-26 ENCOUNTER — Encounter (INDEPENDENT_AMBULATORY_CARE_PROVIDER_SITE_OTHER): Payer: Self-pay | Admitting: Family Medicine

## 2018-09-18 ENCOUNTER — Other Ambulatory Visit (INDEPENDENT_AMBULATORY_CARE_PROVIDER_SITE_OTHER): Payer: Self-pay | Admitting: Family Medicine

## 2018-09-18 DIAGNOSIS — F902 Attention-deficit hyperactivity disorder, combined type: Secondary | ICD-10-CM

## 2018-09-18 MED ORDER — AMPHETAMINE-DEXTROAMPHETAMINE 5 MG OR TABS
ORAL_TABLET | ORAL | 0 refills | Status: DC
Start: 2018-10-18 — End: 2019-07-10

## 2018-09-18 MED ORDER — AMPHETAMINE-DEXTROAMPHETAMINE 5 MG OR TABS
ORAL_TABLET | ORAL | 0 refills | Status: DC
Start: 2018-11-18 — End: 2018-12-19

## 2018-09-18 MED ORDER — AMPHETAMINE-DEXTROAMPHETAMINE 5 MG OR TABS
ORAL_TABLET | ORAL | 0 refills | Status: DC
Start: 2018-09-18 — End: 2018-12-19

## 2018-09-18 MED ORDER — LISDEXAMFETAMINE DIMESYLATE 70 MG OR CAPS
70.0000 mg | ORAL_CAPSULE | Freq: Every morning | ORAL | 0 refills | Status: DC
Start: 2018-11-18 — End: 2019-01-01

## 2018-09-18 MED ORDER — VYVANSE 70 MG OR CAPS
ORAL_CAPSULE | ORAL | 0 refills | Status: DC
Start: 2018-09-18 — End: 2019-01-01

## 2018-09-18 MED ORDER — LISDEXAMFETAMINE DIMESYLATE 70 MG OR CAPS
70.0000 mg | ORAL_CAPSULE | Freq: Every morning | ORAL | 0 refills | Status: DC
Start: 2018-10-18 — End: 2018-12-19

## 2018-09-18 NOTE — Addendum Note (Signed)
Addended by: Krystal Clark on: 09/18/2018 07:35 PM     Modules accepted: Orders

## 2018-10-15 ENCOUNTER — Other Ambulatory Visit (INDEPENDENT_AMBULATORY_CARE_PROVIDER_SITE_OTHER): Payer: Self-pay | Admitting: Family Medicine

## 2018-10-15 DIAGNOSIS — R790 Abnormal level of blood mineral: Secondary | ICD-10-CM

## 2018-10-17 MED ORDER — FERROUS SULFATE 324 (65 FE) MG OR TBEC
DELAYED_RELEASE_TABLET | ORAL | 2 refills | Status: AC
Start: 2018-10-17 — End: ?

## 2018-12-15 ENCOUNTER — Telehealth (INDEPENDENT_AMBULATORY_CARE_PROVIDER_SITE_OTHER): Payer: Self-pay | Admitting: Family Medicine

## 2018-12-15 ENCOUNTER — Encounter (INDEPENDENT_AMBULATORY_CARE_PROVIDER_SITE_OTHER): Payer: Self-pay | Admitting: Family Medicine

## 2018-12-15 DIAGNOSIS — F902 Attention-deficit hyperactivity disorder, combined type: Secondary | ICD-10-CM

## 2018-12-15 NOTE — Telephone Encounter (Signed)
RETURN CALL: Voicemail - Detailed Message      SUBJECT:  Refill Request    NAME OF MEDICATION(S):     amphetamine-dextroAMPHetamine (Adderall) 5 MG tablet    Vyvanse 70 MG capsule    DATE NEEDED BY: 12/19/18    PRESCRIBING PROVIDER: Dr. Heywood Bene NAME/LOCATION: [facility]    ADDITIONAL INFORMATION: Patient is requesting a refill for these medications.

## 2018-12-16 NOTE — Telephone Encounter (Signed)
Patient sent eCare message, she will be out of medication before her appt on 9/21 and asking for a refill.     Medication and pharmacy pended

## 2018-12-17 ENCOUNTER — Other Ambulatory Visit (INDEPENDENT_AMBULATORY_CARE_PROVIDER_SITE_OTHER): Payer: Self-pay | Admitting: Family Medicine

## 2018-12-17 DIAGNOSIS — F902 Attention-deficit hyperactivity disorder, combined type: Secondary | ICD-10-CM

## 2018-12-19 MED ORDER — AMPHETAMINE-DEXTROAMPHETAMINE 5 MG OR TABS
ORAL_TABLET | ORAL | 0 refills | Status: DC
Start: 2018-12-19 — End: 2019-01-01

## 2018-12-19 MED ORDER — LISDEXAMFETAMINE DIMESYLATE 70 MG OR CAPS
70.0000 mg | ORAL_CAPSULE | Freq: Every morning | ORAL | 0 refills | Status: DC
Start: 2018-12-19 — End: 2019-01-01

## 2018-12-19 NOTE — Telephone Encounter (Signed)
See other TE.  Refilled already.

## 2018-12-19 NOTE — Telephone Encounter (Signed)
See other TEs.

## 2018-12-30 ENCOUNTER — Telehealth (INDEPENDENT_AMBULATORY_CARE_PROVIDER_SITE_OTHER): Payer: Self-pay | Admitting: Family Medicine

## 2018-12-30 NOTE — Telephone Encounter (Signed)
Info sent

## 2019-01-01 ENCOUNTER — Telehealth (INDEPENDENT_AMBULATORY_CARE_PROVIDER_SITE_OTHER): Payer: PRIVATE HEALTH INSURANCE | Admitting: Family Medicine

## 2019-01-01 ENCOUNTER — Encounter (INDEPENDENT_AMBULATORY_CARE_PROVIDER_SITE_OTHER): Payer: Self-pay | Admitting: Family Medicine

## 2019-01-01 DIAGNOSIS — F902 Attention-deficit hyperactivity disorder, combined type: Secondary | ICD-10-CM

## 2019-01-01 NOTE — Progress Notes (Signed)
Reason for visit: Medication      Cervical screening/PAP:UTD  Mammo: UTD  Colon Screen: UTD  Have you seen a specialist since your last visit: NO        HM Due:   Health Maintenance   Topic Date Due   . Influenza Vaccine (1) 01/11/2019   . Depression Screening (PHQ-2)  06/06/2019   . Cervical Cancer Screening  01/06/2021   . DTaP, Tdap, and Td Vaccines (3 - Td) 01/18/2022   . Hepatitis C Screening  Completed   . HIV Screening  Completed   . Hepatitis B Vaccine  Aged Out   . Pneumococcal Vaccine: Pediatrics (0-5 years) and At-Risk Patients (6-64 years)  Discontinued              Future Appointments   Date Time Provider [place]   01/01/2019  1:45 PM Debelak, Burna Mortimer, MD [facility]

## 2019-01-01 NOTE — Progress Notes (Signed)
Distant Site Telemedicine Encounter    I conducted this encounter from [facility] via secure, live, face-to-face video conference with the patient. Lisa Flynn was located at [place], [place] alone.  Prior to the interview, the risks and benefits of telemedicine were discussed with the patient and verbal consent was obtained.      Consent  Due to the COVID-19 pandemic in [place], Telemedicine will be used.  As with any health care service, there are risks associated with the use of telemedicine, including equipment failure, poor image resolution, and information security issues.    Do you consent to the use of telemedicine in your medical care today? YES    1:59 PM  Lisa Flynn is a 42 year old year old female with a history of ADD who presents for a telemedicine visit today for the following:    1.  ADD:  Feels the dose is appropriate and helps her focus at work, as well as care for her family and take care of household tasks on weekends. Normal appetite, weight is stable.  Mood doing well, children back in private school.     ROS:  Denies chest pain, palpitations    (reviewed and updated today):  Patient Active Problem List   Diagnosis   . Alopecia areata   . Headache   . Presence of intrauterine contraceptive device   . CONTROLLED SUBSTANCES PLAN ON FILE   . Attention deficit disorder   . Subclinical hyperthyroidism   . Thyroid antibody positive   . Low ferritin     meds reviewed as below    O:   General appearance: healthy, alert, no distress, good range of affect   Wt Readings from Last 3 Encounters:   01/01/19 127 lb (57.6 kg)   06/26/18 130 lb (59 kg)   06/05/18 (P) 126 lb (57.2 kg)           A/P:  (F90.2) ADHD (attention deficit hyperactivity disorder), combined type: stable on current doses, refilled as below.  May call for refills in 3 months with planned follow up in person in 6 months, sooner if concerns.   Plan: amphetamine-dextroAMPHetamine 5 MG tablet,         lisdexamfetamine 70 MG capsule,         lisdexamfetamine 70 MG capsule,         lisdexamfetamine (Vyvanse) 70 MG capsule,         amphetamine-dextroAMPHetamine 5 MG tablet,         amphetamine-dextroAMPHetamine 5 MG tablet               2:04 PM

## 2019-01-02 MED ORDER — LISDEXAMFETAMINE DIMESYLATE 70 MG OR CAPS
70.0000 mg | ORAL_CAPSULE | Freq: Every morning | ORAL | 0 refills | Status: DC
Start: 2019-01-18 — End: 2019-05-21

## 2019-01-02 MED ORDER — AMPHETAMINE-DEXTROAMPHETAMINE 5 MG OR TABS
ORAL_TABLET | ORAL | 0 refills | Status: DC
Start: 2019-03-20 — End: 2019-04-23

## 2019-01-02 MED ORDER — LISDEXAMFETAMINE DIMESYLATE 70 MG OR CAPS
70.0000 mg | ORAL_CAPSULE | Freq: Every morning | ORAL | 0 refills | Status: DC
Start: 2019-03-20 — End: 2019-04-23

## 2019-01-02 MED ORDER — AMPHETAMINE-DEXTROAMPHETAMINE 5 MG OR TABS
ORAL_TABLET | ORAL | 0 refills | Status: DC
Start: 2019-01-18 — End: 2019-07-10

## 2019-01-02 MED ORDER — AMPHETAMINE-DEXTROAMPHETAMINE 5 MG OR TABS
ORAL_TABLET | ORAL | 0 refills | Status: DC
Start: 2019-02-18 — End: 2019-07-07

## 2019-01-02 MED ORDER — LISDEXAMFETAMINE DIMESYLATE 70 MG OR CAPS
70.0000 mg | ORAL_CAPSULE | Freq: Every morning | ORAL | 0 refills | Status: DC
Start: 2019-02-18 — End: 2019-07-10

## 2019-01-28 ENCOUNTER — Other Ambulatory Visit: Payer: Self-pay

## 2019-03-12 ENCOUNTER — Ambulatory Visit (INDEPENDENT_AMBULATORY_CARE_PROVIDER_SITE_OTHER): Payer: PRIVATE HEALTH INSURANCE

## 2019-03-12 ENCOUNTER — Encounter (INDEPENDENT_AMBULATORY_CARE_PROVIDER_SITE_OTHER): Payer: Self-pay

## 2019-03-12 DIAGNOSIS — Z23 Encounter for immunization: Secondary | ICD-10-CM

## 2019-03-12 NOTE — Progress Notes (Signed)
Vaccine Screening Questions    Interpreter: No    1. Are you allergic to Latex? NO    2.  Have you had a serious reaction or an allergic reaction to a vaccine?  NO    3.  Currently have a moderate or severe illness, including fever?  NO    4.  Ever had a seizure or any neurological problem associated with a vaccine? (DTaP/TDaP/DTP pertinent) NO    5.  Is patient receiving any live vaccinations today? (Varicella-Chickenpox, MMR-Measles/Mumps/Rubella, Zoster-Shingles, Flumist, Yellow Fever) NOTE: oral rotavirus is exempt  NO    If YES to any of the questions above - Do NOT give vaccine.  Consult with RN or provider in clinic.  (#5 can be YES if all Live vaccine questions are answered NO)    If NO to all questions above - Patient may receive vaccine.    6.  Do you need to receive the Flu vaccine today? YES - Additional Flu Questions  Flu Vaccine Screening Questions:    Ever had a serious allergic reaction to eggs?  NO    Ever had Guillain-Barre syndrome associated with a vaccine? NO    Less than 6 months old? NO    If YES to any of the Flu questions above - NO Flu Vaccine to be given.  Patient may consult provider as needed.    If NO to all questions above - Patient may receive Flu Shot (IM)    Is the patient requesting Flumist? NO    If between 6 months and 80 years of age, was flu vaccine received last year?  N/A  If NO to above question:  . Children who are receiving influenza vaccine for the first time - administer 2 doses of the current influenza vaccine (separated by at least 4 weeks).          All patients are encouraged to wait 15 minutes before leaving after receiving any vaccine.    VIS given 03/12/2019 by Ileene Hutchinson, CMA.

## 2019-04-20 ENCOUNTER — Other Ambulatory Visit (INDEPENDENT_AMBULATORY_CARE_PROVIDER_SITE_OTHER): Payer: Self-pay | Admitting: Family Medicine

## 2019-04-20 DIAGNOSIS — F902 Attention-deficit hyperactivity disorder, combined type: Secondary | ICD-10-CM

## 2019-04-21 ENCOUNTER — Other Ambulatory Visit (INDEPENDENT_AMBULATORY_CARE_PROVIDER_SITE_OTHER): Payer: Self-pay | Admitting: Family Medicine

## 2019-04-21 ENCOUNTER — Encounter (INDEPENDENT_AMBULATORY_CARE_PROVIDER_SITE_OTHER): Payer: Self-pay | Admitting: Family Medicine

## 2019-04-21 DIAGNOSIS — F902 Attention-deficit hyperactivity disorder, combined type: Secondary | ICD-10-CM

## 2019-04-21 NOTE — Telephone Encounter (Signed)
Medication and pharmacy pended

## 2019-04-21 NOTE — Telephone Encounter (Signed)
Will need to wait for PCP to review since controlled substances

## 2019-04-23 MED ORDER — VYVANSE 70 MG OR CAPS
ORAL_CAPSULE | ORAL | 0 refills | Status: DC
Start: 2019-04-23 — End: 2019-07-07

## 2019-04-23 MED ORDER — AMPHETAMINE-DEXTROAMPHETAMINE 5 MG OR TABS
ORAL_TABLET | ORAL | 0 refills | Status: DC
Start: 2019-04-23 — End: 2019-05-21

## 2019-04-23 NOTE — Telephone Encounter (Signed)
See other TE.

## 2019-04-23 NOTE — Telephone Encounter (Signed)
Second request

## 2019-04-23 NOTE — Telephone Encounter (Addendum)
Refilled medication as below.  Patient is due for an office visit. Please help patient schedule a visit

## 2019-04-24 NOTE — Telephone Encounter (Signed)
[facility] sent.

## 2019-05-21 ENCOUNTER — Other Ambulatory Visit (INDEPENDENT_AMBULATORY_CARE_PROVIDER_SITE_OTHER): Payer: Self-pay | Admitting: Family Medicine

## 2019-05-21 ENCOUNTER — Encounter (INDEPENDENT_AMBULATORY_CARE_PROVIDER_SITE_OTHER): Payer: Self-pay | Admitting: Family Medicine

## 2019-05-21 DIAGNOSIS — F902 Attention-deficit hyperactivity disorder, combined type: Secondary | ICD-10-CM

## 2019-05-21 MED ORDER — AMPHETAMINE-DEXTROAMPHETAMINE 5 MG OR TABS
5.0000 mg | ORAL_TABLET | Freq: Every day | ORAL | 0 refills | Status: DC
Start: 2019-06-21 — End: 2019-07-07

## 2019-05-21 MED ORDER — LISDEXAMFETAMINE DIMESYLATE 70 MG OR CAPS
70.0000 mg | ORAL_CAPSULE | Freq: Every morning | ORAL | 0 refills | Status: DC
Start: 2019-05-21 — End: 2019-07-07

## 2019-05-21 MED ORDER — AMPHETAMINE-DEXTROAMPHETAMINE 5 MG OR TABS
5.0000 mg | ORAL_TABLET | Freq: Every day | ORAL | 0 refills | Status: DC
Start: 2019-05-21 — End: 2019-07-07

## 2019-05-21 MED ORDER — LISDEXAMFETAMINE DIMESYLATE 70 MG OR CAPS
70.0000 mg | ORAL_CAPSULE | Freq: Every morning | ORAL | 0 refills | Status: DC
Start: 2019-06-21 — End: 2019-07-07

## 2019-05-21 NOTE — Telephone Encounter (Signed)
See additional [facility].

## 2019-05-21 NOTE — Telephone Encounter (Signed)
Routed to PCP in earlier 2/8 TE (Surescripts).

## 2019-05-21 NOTE — Telephone Encounter (Signed)
Medication Refill     Is this a controlled substance:Yes, adderall & vyvanse  Do they have a controlled substance agreement:   No  Was pt seen in the last 3 months No: Needs Routine follow up- Routing to FD to assist in scheduling     Last written 02/18/2019 and 04/23/2019  Number of refills 0    Last Labs:    Controlled Substance ( Chronic pain UA) :DateN/A      Rx and pharmacy pended.    Routing to provider to relay instructions for next step. Please advised on appointment type if indicated (in person ov, telemed, or phone visit).

## 2019-07-07 NOTE — Patient Instructions (Signed)
It was a pleasure to see you in clinic today. Your Medical Assistant was: Sharyn Lull           Here are the items that would be beneficial to your health to complete and the dates they are due.  Please contact us if you have questions about scheduling or if these have been completed elsewhere:    Health Maintenance   Topic Date Due   . COVID-19 Vaccine (1) Never done   . Depression Screening (PHQ-2)  01/01/2020   . Cervical Cancer Screening  01/06/2021   . DTaP, Tdap, and Td Vaccines (3 - Td) 01/18/2022   . Influenza Vaccine  Completed   . Hepatitis C Screening  Completed   . HIV Screening  Completed   . Hepatitis A Vaccine  Aged Out   . Hepatitis B Vaccine  Aged Out   . Pneumococcal Vaccine: Pediatrics (0-5 years) and At-Risk Patients (6-64 years)  Discontinued        You can schedule an appointment to see [place] by calling  [phone] or via eCare.     If labs were ordered today the results are expected to be available via eCare 5 days later. Otherwise, result letters are mailed 7-10 days after your tests are completed. If your physician needs to change your care based on your results, you will receive a phone call to notify you. If you haven't heard from him/her and it has been more than 10 days please give [place] a call.     If you are not yet signed up for eCare, please speak with a team member at the front desk. eCare enrollment will allow you to make appointments online, view test results and obtain a copy of our After Visit Summary    Thank you for choosing [facility].    Recommendations for Pap Smear screening age 28-65    ? Pap smear every 3 years OR pap smear with HPV screening every 5 years  o Exceptions:   ? if you have ever been treated for moderate or severe dysplasia of the cervix and have completed post-treatment surveillance, you can return to routine screening for your age group, and continue Pap screening for at least twenty years.  ? If you have a suppressed immune system you may  need more frequent screening.  ? Discontinue screening at age 60 if there have been three consecutive normal Pap smears or negative HPV test.  ? If you are sexually active, you should have a yearly screening for Chlamydia, HIV, and other sexually transmitted infections as appropriate  ? If you are using contraception, you should see your provider yearly for symptom review, blood pressure check (if using a hormonal method), and exam if needed.  Leading a Healthy Life  Six tips to help improve your health and wellness     This explains how these 6 basic guidelines may improve your health and wellness:   Eat well to give your body the energy it needs.   Stay or get active.   A healthy mind is part of a healthy body.   Practice safe living habits.   Keep your mind and body free of harmful drugs and alcohol.   Get regular health care.     Tip #1: Eat well to give your body the energy it needs.   Your body needs nutritious foods to stay strong and healthy.   Here are some general eating guidelines:   Have 2 servings of fish  or other seafood 2 times a week (1 serving = 4 ounces).   If you eat dairy products, choose low-fat (1%) or nonfat ones.   If you eat meat, cut down on the amount. Replace it with plant-based foods such as beans, whole grains, fruits and vegetables, and nuts and seeds.   Have less than 1,500 mg of sodium (salt) a day.   Cut down on "junk food" like alcohol, fatty foods, chips, candy, and other sweets.     Tip #2: Stay or get active.   Exercise for at least 30 minutes at a time, 3 times a week. Regular physical activity can help you:   Live longer and feel better   Be stronger and more flexible   Build strong bones   Prevent depression   Strengthen your immune system   Maintain a healthy body weight     Tip #3: Remember: A healthy mind is part of a healthy body.   A good state of mind can help you make healthy choices. Here are a few tips for keeping your mind healthy:   Reduce stress in your life.    Make some time every day for things that are fun.   Get enough sleep. Lack of sleep reduces how well you can concentrate, increases mood swings, and raises your risk of having a car accident.   Ask your health care provider for help if you feel depressed or anxious for more than several days in a row.     Tip #4: Practice safe living habits.   Accidents and Injuries     Accidents and injuries are the 5th leading cause of death in the [place]   Accidents in the home cause thousands of permanent injuries every year.     The most common accidents are fires, falls, and drowning. To help yourself and your family stay safe:   Install smoke detectors on each floor of your home.   Make sure everyone in your family knows how to swim  Stay safe on the road:  Wear a seatbelt.   Do not ride with someone who has been drinking or taking drugs.   Do not speak on a cell phone or send, read, or write text messages while you are driving.   Wear a helmet when you ride a bicycle or motorcycle.   Get enough sleep at night, and do not drive when you are tired.     Hand Hygiene   Protect yourself from germs by washing your hands often. Always wash your hands:   After you change a diaper or use the toilet   Before you start and after you finish preparing food     Tip #5: Keep your mind and body free of harmful drugs and alcohol.   Tobacco causes more health problems than any other substance. These problems include lung disease, heart disease, and many types of cancer. The nicotine in tobacco is the most addictive and widely used drug.   Too much alcohol can cause damage to your liver, heart, brain, bones, and other body tissues. Being under the influence of alcohol also increases your chance of being injured in an accident.    Alcohol can cause fetal alcohol syndrome in your children if you drink regularly when you are pregnant.   Street drugs, like marijuana, cocaine, methamphetamine, heroin, or pain pills not prescribed by your doctor can  harm your health. They may be mixed with harmful substances, and using them can cause people  to put themselves in dangerous situations.     Tip #6: Get regular health care.   Many people think they need to see the doctor only when they are sick. But, health care providers can also help you stay healthy.   Find a health care provider who works with you to manage your health.   Ask your health care provider what diseases you are at risk for. Learn what you can do to prevent or control them.   Get yourself and your family immunized against life-threatening diseases.

## 2019-07-09 ENCOUNTER — Encounter (INDEPENDENT_AMBULATORY_CARE_PROVIDER_SITE_OTHER): Payer: Self-pay | Admitting: Family Medicine

## 2019-07-09 ENCOUNTER — Ambulatory Visit (INDEPENDENT_AMBULATORY_CARE_PROVIDER_SITE_OTHER): Payer: PRIVATE HEALTH INSURANCE | Admitting: Family Medicine

## 2019-07-09 VITALS — BP 119/80 | HR 92 | Temp 97.7°F | Resp 20 | Ht 64.57 in | Wt 125.8 lb

## 2019-07-09 DIAGNOSIS — F902 Attention-deficit hyperactivity disorder, combined type: Secondary | ICD-10-CM

## 2019-07-09 DIAGNOSIS — Z0000 Encounter for general adult medical examination without abnormal findings: Secondary | ICD-10-CM

## 2019-07-09 DIAGNOSIS — Z01419 Encounter for gynecological examination (general) (routine) without abnormal findings: Secondary | ICD-10-CM

## 2019-07-09 DIAGNOSIS — E0590 Thyrotoxicosis, unspecified without thyrotoxic crisis or storm: Secondary | ICD-10-CM

## 2019-07-09 DIAGNOSIS — R790 Abnormal level of blood mineral: Secondary | ICD-10-CM

## 2019-07-09 LAB — FERRITIN: Ferritin: 9 ng/mL — ABNORMAL LOW (ref 10–180)

## 2019-07-09 LAB — CBC, DIFF
% Basophils: 1 %
% Eosinophils: 3 %
% Immature Granulocytes: 0 %
% Lymphocytes: 28 %
% Monocytes: 8 %
% Neutrophils: 60 %
% Nucleated RBC: 0 %
Absolute Eosinophil Count: 0.15 10*3/uL (ref 0.00–0.50)
Absolute Lymphocyte Count: 1.44 10*3/uL (ref 1.00–4.80)
Basophils: 0.04 10*3/uL (ref 0.00–0.20)
Hematocrit: 43 % (ref 36–45)
Hemoglobin: 13.9 g/dL (ref 11.5–15.5)
Immature Granulocytes: 0.01 10*3/uL (ref 0.00–0.05)
MCH: 32.3 pg (ref 27.3–33.6)
MCHC: 32.2 g/dL (ref 32.2–36.5)
MCV: 101 fL — ABNORMAL HIGH (ref 81–98)
Monocytes: 0.4 10*3/uL (ref 0.00–0.80)
Neutrophils: 3.2 10*3/uL (ref 1.80–7.00)
Nucleated RBC: 0 10*3/uL
Platelet Count: 287 10*3/uL (ref 150–400)
RBC: 4.3 10*6/uL (ref 3.80–5.00)
RDW-CV: 12.3 % (ref 11.6–14.4)
WBC: 5.24 10*3/uL (ref 4.3–10.0)

## 2019-07-09 LAB — IRON BINDING CAPACITY (W/IRON, TRANSFERRIN & TRANSF SAT)
Iron, SRM: 80 ug/dL (ref 31–171)
Total Iron Binding Capacity: 375 ug/dL (ref 270–535)
Transferrin Saturation: 21 % (ref 10–45)
Transferrin: 268 mg/dL (ref 192–382)

## 2019-07-09 LAB — TSH WITH REFLEXIVE FREE T4: TSH with Reflexive Free T4: 2.657 u[IU]/mL (ref 0.400–5.000)

## 2019-07-09 NOTE — Progress Notes (Signed)
Reason for visit:   Chief Complaint   Patient presents with   . Wellness           Cervical screening/PAP:N/A  Mammo: N/A  Colon Screen: N/A  Have you seen a specialist since your last visit: NO   Name and location and date.N/A     HM Due:   Health Maintenance   Topic Date Due   . COVID-19 Vaccine (1) Never done   . Depression Screening (PHQ-2)  01/01/2020   . Cervical Cancer Screening  01/06/2021   . DTaP, Tdap, and Td Vaccines (3 - Td) 01/18/2022   . Influenza Vaccine  Completed   . Hepatitis C Screening  Completed   . HIV Screening  Completed   . Hepatitis A Vaccine  Aged Out   . Hepatitis B Vaccine  Aged Out   . Pneumococcal Vaccine: Pediatrics (0-5 years) and At-Risk Patients (6-64 years)  Discontinued              No future appointments.

## 2019-07-09 NOTE — Progress Notes (Signed)
Lisa Flynn is a 44 year old female here today for a preventive health visit.   Other problems or concerns today:   1. ADD: requesting refill on ADD medications which are working well for her.  She would like to start exercising more regularly to help combat her worried about depression related to her brother moving out next week.    GYN HISTORY  OB History   Gravida Para Term Preterm AB Living   1 1 1  0 0 1   SAB TAB Ectopic Multiple Live Births   0 0 0 0 0       Currently having periods: YES  Patient's last menstrual period was 06/23/2019.   Periods are regular q 28-30 days, lasting 5 days.    Crampy, lower abdominal pain during periods: none  Cyclic symptoms: none  Intermenstrual bleeding, spotting,or discharge: No   Last pap: 2017  Pap history: no history of abnormal paps  Other gyn history: none    SEXUAL HISTORY  Sexual activity: yes, single partner, contraception - IUD: ParaGard T380A IUD  History of STDs: none known  Number of sex partners in the past year: 1  New partner(s) since last STD check: No    Sexual concerns: No    CANCER SCREENING  Family History     Problem (# of Occurrences) Relation (Name,Age of Onset)    ADHD (2) Child: nephew, Child: nephew    Diabetes (2) Mother, Paternal Grandfather    Hypertension (1) Mother    Stroke (1) Maternal Grandfather: stroke at 56    Thyroid Disease (1) Mother       Negative family history of: Cancer, Heart Disease        Prior mammogram: YES   History of abnormal mammogram: NO    LIFESTYLE  Current dietary habits: healthy diet in general  Calcium: dietary sources only  Current exercise habits: no regular exercise       Review Of Systems  Regular dental exams: not asked  Review of Systems   Constitution: Negative for weight gain and weight loss.   Eyes: Negative for visual disturbance.   Cardiovascular: Negative for chest pain, irregular heartbeat and palpitations.   Respiratory: Negative for cough, shortness of breath and wheezing.    Skin:  Negative for rash and suspicious lesions.        alopecia   Gastrointestinal: Negative for abdominal pain, constipation and diarrhea.   Genitourinary: Negative for dysuria, hematuria and pelvic pain.   Psychiatric/Behavioral: Negative for depression. The patient is not nervous/anxious.        EXAM:  BP 119/80   Pulse 92   Temp 36.5 C   Resp 20   Ht 5' 4.57" (1.64 m)   Wt 57.1 kg (125 lb 12.8 oz)   LMP 06/23/2019   SpO2 98%   BMI 21.22 kg/m   Physical Exam   Constitutional: She is oriented to person, place, and time. She appears well-developed and well-nourished. No distress.   HENT:   Head: Normocephalic and atraumatic.   Right Ear: External ear normal.   Left Ear: External ear normal.   Eyes: Pupils are equal, round, and reactive to light. Conjunctivae and EOM are normal.   Neck: Normal range of motion. Neck supple. No thyromegaly present.   Cardiovascular: Normal rate, regular rhythm and normal heart sounds.   No murmur heard.  Pulmonary/Chest: Effort normal and breath sounds normal. No respiratory distress. She has no wheezes. She has no rales. She exhibits no  tenderness.   Abdominal: Soft. Bowel sounds are normal. She exhibits no distension and no mass. There is no abdominal tenderness. There is no rebound and no guarding.   Musculoskeletal: Normal range of motion.         General: No edema.   Lymphadenopathy:     She has no cervical adenopathy.   Neurological: She is alert and oriented to person, place, and time.   Skin: Skin is warm and dry. No rash noted.   Scalp alopecia   Psychiatric: She has a normal mood and affect. Her behavior is normal. Judgment and thought content normal.      Breasts: No obvious deformity or mass to inspection, nipples everted bilaterally, no skin lesion or nipple discharge, no mass palpated, no axillary lymphadenopathy, significant fibrocystic change without dominant masses  Pelvic exam: deferred    ASSESSMENT/PLAN:  (Z01.419) Encounter for gynecological examination  (general) (routine) without abnormal findings  (primary encounter diagnosis): routine advice as below, encouraged focusing on establishing healthy habits that will continue lifelong.   Discussed setting goals for healthy diet and exercise until in a good routine of healthy diet and regular aerobic exercise 3-4 x per week for 30-60 min, or 10000 steps per day, but make sure she does not have resulting weight loss.  Discussed calcium intake, recommended 1000-1200 mg calcium through diet plus supplement, preferably through diet or smaller supplement doses divided throughout the day.      (F90.2) ADHD (attention deficit hyperactivity disorder), combined type:  Stable, refilled x 3 months.   Plan: amphetamine-dextroAMPHetamine 5 MG tablet,         amphetamine-dextroAMPHetamine 5 MG tablet,         amphetamine-dextroAMPHetamine 5 MG tablet,         lisdexamfetamine (Vyvanse) 70 MG capsule,         lisdexamfetamine (Vyvanse) 70 MG capsule,         lisdexamfetamine (Vyvanse) 70 MG capsule    (R79.0) Low ferritin    (E05.90) Subclinical hyperthyroidism    (Z00.00) Laboratory examination ordered as part of a routine general medical examination  Plan: Iron Binding Capacity (Includes Total Iron),         Ferritin, CBC with Diff, TSH w/Reflexive Free         T4        Health Maintenance   Topic Date Due   . COVID-19 Vaccine (1) Never done   . Depression Screening (PHQ-2)  01/01/2020   . Cervical Cancer Screening  01/06/2021   . DTaP, Tdap, and Td Vaccines (3 - Td) 01/18/2022   . Influenza Vaccine  Completed   . Hepatitis C Screening  Completed   . HIV Screening  Completed   . Hepatitis A Vaccine  Aged Out   . Hepatitis B Vaccine  Aged Out   . Pneumococcal Vaccine: Pediatrics (0-5 years) and At-Risk Patients (6-64 years)  Discontinued        Immunizations or studies due: as ordered below      Preventive counseling: breast self-exam  skin cancer prevention/self-exam  immunizations  dental care  moderation in EtOH use/no drinking  and driving  low-fat high-fiber diet  adequate calcium intake  vitamin D supplementation  proper exercise  Follow-up: 6 months    Recommended mammogram. Pt declines right now, wishing to wait until there is a Print[employer] at work.

## 2019-07-10 MED ORDER — LISDEXAMFETAMINE DIMESYLATE 70 MG OR CAPS
70.0000 mg | ORAL_CAPSULE | Freq: Every morning | ORAL | 0 refills | Status: DC
Start: 2019-09-21 — End: 2019-11-09

## 2019-07-10 MED ORDER — AMPHETAMINE-DEXTROAMPHETAMINE 5 MG OR TABS
ORAL_TABLET | ORAL | 0 refills | Status: DC
Start: 2019-07-22 — End: 2019-11-09

## 2019-07-10 MED ORDER — AMPHETAMINE-DEXTROAMPHETAMINE 5 MG OR TABS
5.0000 mg | ORAL_TABLET | Freq: Every day | ORAL | 0 refills | Status: DC
Start: 2019-09-21 — End: 2019-11-09

## 2019-07-10 MED ORDER — LISDEXAMFETAMINE DIMESYLATE 70 MG OR CAPS
70.0000 mg | ORAL_CAPSULE | Freq: Every morning | ORAL | 0 refills | Status: DC
Start: 2019-07-22 — End: 2019-11-09

## 2019-07-10 MED ORDER — LISDEXAMFETAMINE DIMESYLATE 70 MG OR CAPS
70.0000 mg | ORAL_CAPSULE | Freq: Every morning | ORAL | 0 refills | Status: DC
Start: 2019-08-21 — End: 2019-11-09

## 2019-07-10 MED ORDER — AMPHETAMINE-DEXTROAMPHETAMINE 5 MG OR TABS
5.0000 mg | ORAL_TABLET | Freq: Every day | ORAL | 0 refills | Status: DC
Start: 2019-08-21 — End: 2019-11-09

## 2019-11-03 ENCOUNTER — Other Ambulatory Visit (INDEPENDENT_AMBULATORY_CARE_PROVIDER_SITE_OTHER): Payer: Self-pay | Admitting: Family Medicine

## 2019-11-03 DIAGNOSIS — F902 Attention-deficit hyperactivity disorder, combined type: Secondary | ICD-10-CM

## 2019-11-05 NOTE — Telephone Encounter (Signed)
1st attempt.    LMTCB    CCR pls update patient on rx and schedule telemed apt with Joellyn Haff.  See msg below.    2nd attempt  mychart msg sent.

## 2019-11-05 NOTE — Telephone Encounter (Signed)
Pt is out of town wont be back until Friday.     Please adv.

## 2019-11-05 NOTE — Telephone Encounter (Signed)
Patient overdue for 3 month ADHD follow-up. Please offer telemedicine visit with Lisa Flynn today before refills can authorized.

## 2019-11-05 NOTE — Telephone Encounter (Signed)
Please assist in scheduling with any provider for telemedicine visit on Friday or phone visit sooner than this.  Okay to schedule with provider at one of our other clinics if no availability at [facility].

## 2019-11-05 NOTE — Telephone Encounter (Signed)
LMTCB    CCR: Please assist patient with scheduling [facility] visit once she is back in town. Thank you    1st attempt

## 2019-11-09 ENCOUNTER — Telehealth (INDEPENDENT_AMBULATORY_CARE_PROVIDER_SITE_OTHER): Payer: PRIVATE HEALTH INSURANCE | Admitting: Family

## 2019-11-09 DIAGNOSIS — F902 Attention-deficit hyperactivity disorder, combined type: Secondary | ICD-10-CM

## 2019-11-09 MED ORDER — LISDEXAMFETAMINE DIMESYLATE 70 MG OR CAPS
70.0000 mg | ORAL_CAPSULE | Freq: Every morning | ORAL | 0 refills | Status: DC
Start: 2019-11-09 — End: 2020-03-07

## 2019-11-09 MED ORDER — AMPHETAMINE-DEXTROAMPHETAMINE 5 MG OR TABS
5.0000 mg | ORAL_TABLET | Freq: Every day | ORAL | 0 refills | Status: DC
Start: 2019-12-08 — End: 2020-03-07

## 2019-11-09 MED ORDER — LISDEXAMFETAMINE DIMESYLATE 70 MG OR CAPS
70.0000 mg | ORAL_CAPSULE | Freq: Every morning | ORAL | 0 refills | Status: DC
Start: 2020-01-07 — End: 2020-02-08

## 2019-11-09 MED ORDER — AMPHETAMINE-DEXTROAMPHETAMINE 5 MG OR TABS
5.0000 mg | ORAL_TABLET | Freq: Every day | ORAL | 0 refills | Status: DC
Start: 2020-01-07 — End: 2020-02-08

## 2019-11-09 MED ORDER — LISDEXAMFETAMINE DIMESYLATE 70 MG OR CAPS
70.0000 mg | ORAL_CAPSULE | Freq: Every morning | ORAL | 0 refills | Status: DC
Start: 2019-12-08 — End: 2020-03-07

## 2019-11-09 MED ORDER — AMPHETAMINE-DEXTROAMPHETAMINE 5 MG OR TABS
ORAL_TABLET | ORAL | 0 refills | Status: DC
Start: 2019-11-09 — End: 2020-03-07

## 2019-11-09 NOTE — Progress Notes (Signed)
Consent:  You have chosen to receive care through the use of telemedicine. Telemedicine enables health care providers at different locations to provide safe, effective, and  convenient care through the use of technology. As with any health care service, there are risks associated with the use of telemedicine, including equipment failure, poor image resolution, and information security issues.    Do you understand the risks and benefits of telemedicine as I have explained them to you? Yes.    Have your questions regarding telemedicine been answered? Yes.    Do you consent to the use of telemedicine in your medical care today? Yes.      Distant Site Encounter  Is this visit being conducted using Telemedicine (live, interactive video and audio) or Telephone (audio only)? Telemedicine (live, interactive video & audio)     Distant Site Telemedicine Encounter    I conducted this encounter from [employer] via secure, live, face-to-face video conference with the patient. Trinadee was located at [place], [place]. Prior to the interview, the risks and benefits of telemedicine were discussed with the patient and verbal consent was obtained.    1:39 PM  Lisa Flynn is a 44 year old year old female who presents for a telemedicine visit today for the following:    Chief Complaint   Patient presents with   . Attention Deficit Disorder         SUBJECTIVE:   HPI:  1) ADHD:  Patient reports prior to starting medication, patient struggled with inattention.  Patient currently takes Vyvanse 70 mg every morning and amphetamine-dextroamphetamine 5 mg every afternoon for ADHD.  Patient reports is doing well on current medications.  Patient reports perceived benefit from treatment.  Patient is able to complete tasks successfully with medication.  Patient denies poor appetite, difficulty sleeping, irritability, mood lability, stomachache, headache and rebound symptoms       Patient Active Problem List   Diagnosis   . Alopecia areata   .  Headache   . Presence of intrauterine contraceptive device   . CONTROLLED SUBSTANCES PLAN ON FILE   . Attention deficit disorder   . Subclinical hyperthyroidism   . Thyroid antibody positive   . Low ferritin       I have reviewed the patients allergies, problem list and medications and updated the patients chart accordingly.         OBJECTIVE:  BP 104/74 Comment: pt reported  Pulse 89 Comment: pt reported  Temp 36.7 C Comment: pt reported  Wt 56.4 kg (124 lb 6.4 oz) Comment: pt reported  SpO2 99% Comment: pt reported  BMI 20.98 kg/m   General appearance: alert well-appearing and no acute distress  Respiratory: Speaking in full sentences comfortably, Normal work of breathing and No cough during visit    Mental Status Examination:  - Dress, grooming, personal hygiene: dressed in clothing appropriate for situation, weather and age, well kempt  - Behavior: cooperative and pleasant  - Speech: clear, coherent  - Mood & Affect: Normal, congruent,   - Coherency and relevance of thought: normal: logical, coherent, goal-directed.  - Through content: normal and logical   - Orientation: intact to person, place and time   - Attention and concentration: alert  - Memory: not tested  - Insight & Judgment:grossly normal judgment and insight        ASSESSMENT/PLAN:  1. ADHD (attention deficit hyperactivity disorder), combined type  Patient is doing well with current management, Medication side effects are not significant, Continue current medical  management, 3 months of medication refilled.  Return in 3 months.   - amphetamine-dextroAMPHetamine 5 MG tablet; Take 1 tablet (5 mg) by mouth daily.  Dispense: 30 tablet; Refill: 0  - amphetamine-dextroAMPHetamine 5 MG tablet; Take 1 tablet (5 mg) by mouth daily.  Dispense: 30 tablet; Refill: 0  - amphetamine-dextroAMPHetamine 5 MG tablet; TAKE ONE TABLET BY MOUTH ONE TIME DAILY  Dispense: 30 tablet; Refill: 0  - lisdexamfetamine (Vyvanse) 70 MG capsule; Take 1 capsule (70 mg) by  mouth every morning.  Dispense: 30 capsule; Refill: 0  - lisdexamfetamine (Vyvanse) 70 MG capsule; Take 1 capsule (70 mg) by mouth every morning.  Dispense: 30 capsule; Refill: 0  - lisdexamfetamine (Vyvanse) 70 MG capsule; Take 1 capsule (70 mg) by mouth every morning.  Dispense: 30 capsule; Refill: 0

## 2020-01-29 ENCOUNTER — Other Ambulatory Visit: Payer: Self-pay

## 2020-02-08 ENCOUNTER — Other Ambulatory Visit (INDEPENDENT_AMBULATORY_CARE_PROVIDER_SITE_OTHER): Payer: Self-pay | Admitting: Family Medicine

## 2020-02-08 DIAGNOSIS — F902 Attention-deficit hyperactivity disorder, combined type: Secondary | ICD-10-CM

## 2020-02-08 MED ORDER — LISDEXAMFETAMINE DIMESYLATE 70 MG OR CAPS
70.0000 mg | ORAL_CAPSULE | Freq: Every morning | ORAL | 0 refills | Status: DC
Start: 2020-02-08 — End: 2020-03-07

## 2020-02-08 MED ORDER — AMPHETAMINE-DEXTROAMPHETAMINE 5 MG OR TABS
5.0000 mg | ORAL_TABLET | Freq: Every day | ORAL | 0 refills | Status: DC
Start: 2020-02-08 — End: 2020-03-07

## 2020-02-08 NOTE — Telephone Encounter (Signed)
Refilled medication as below.  Patient is due for an office visit.  No refills without a visit

## 2020-02-09 NOTE — Telephone Encounter (Signed)
Pt scheduled for 11/29.    Nothing further needed, closing TE.

## 2020-03-07 ENCOUNTER — Telehealth (INDEPENDENT_AMBULATORY_CARE_PROVIDER_SITE_OTHER): Payer: PRIVATE HEALTH INSURANCE | Admitting: Family Medicine

## 2020-03-07 DIAGNOSIS — F902 Attention-deficit hyperactivity disorder, combined type: Secondary | ICD-10-CM

## 2020-03-07 DIAGNOSIS — R790 Abnormal level of blood mineral: Secondary | ICD-10-CM

## 2020-03-07 MED ORDER — AMPHETAMINE-DEXTROAMPHETAMINE 5 MG OR TABS
ORAL_TABLET | ORAL | 0 refills | Status: DC
Start: 2020-03-10 — End: 2020-07-25

## 2020-03-07 MED ORDER — LISDEXAMFETAMINE DIMESYLATE 70 MG OR CAPS
70.0000 mg | ORAL_CAPSULE | Freq: Every morning | ORAL | 0 refills | Status: DC
Start: 2020-05-10 — End: 2020-04-14

## 2020-03-07 MED ORDER — LISDEXAMFETAMINE DIMESYLATE 70 MG OR CAPS
70.0000 mg | ORAL_CAPSULE | Freq: Every morning | ORAL | 0 refills | Status: DC
Start: 2020-04-09 — End: 2020-04-14

## 2020-03-07 MED ORDER — LISDEXAMFETAMINE DIMESYLATE 70 MG OR CAPS
70.0000 mg | ORAL_CAPSULE | Freq: Every morning | ORAL | 0 refills | Status: DC
Start: 2020-03-10 — End: 2020-04-14

## 2020-03-07 MED ORDER — AMPHETAMINE-DEXTROAMPHETAMINE 5 MG OR TABS
5.0000 mg | ORAL_TABLET | Freq: Every day | ORAL | 0 refills | Status: DC
Start: 2020-04-09 — End: 2020-07-25

## 2020-03-07 MED ORDER — AMPHETAMINE-DEXTROAMPHETAMINE 5 MG OR TABS
5.0000 mg | ORAL_TABLET | Freq: Every day | ORAL | 0 refills | Status: DC
Start: 2020-05-10 — End: 2020-06-16

## 2020-03-07 NOTE — Progress Notes (Signed)
Distant Site Telemedicine Encounter    I conducted this encounter from home via secure, live, face-to-face video conference with the patient. Lisa Flynn was located at [employer] alone.  Prior to the interview, the risks and benefits of telemedicine were discussed with the patient and verbal consent was obtained.      Consent  Due to the COVID-19 pandemic in [place], Telemedicine will be used.  As with any health care service, there are risks associated with the use of telemedicine, including equipment failure, poor image resolution, and information security issues.    Do you consent to the use of telemedicine in your medical care today? YES       No chief complaint on file.       Subjective:     Lisa Flynn is a 44 year old female who presents on 03/07/2020     1. ADD: happy with current doses, helps her to focus and stay on task.  No side effects.  2. Iron deficiency:  Just started back on iron due to leg cramping.  Feels that currently is having normal periods and eats a healthy well rounded diet.      Review of Systems   Constitutional: Negative for weight loss.   Cardiovascular: Negative for chest pain, irregular heartbeat and palpitations.   Gastrointestinal: Negative for anorexia.          Objective:    There were no vitals taken for this visit.    Physical Exam  Constitutional:       Appearance: Normal appearance. She is normal weight.   Neurological:      Mental Status: She is alert.   Psychiatric:         Mood and Affect: Mood normal.         Behavior: Behavior normal.         Thought Content: Thought content normal.         Judgment: Judgment normal.            Assessment and Plan:      Makeyla was seen today for attention deficit disorder and anemia.    Low ferritin: Likely a combination of heavy periods and low dietary iron intake.  Patient subjectively feels better with iron supplementation.  Encourage patient to schedule repeat iron levels in 2 to 3 months.  If not improving, may  need to consider other sources of iron deficiency for further work-up.    ADHD (attention deficit hyperactivity disorder), combined type: Stable on current medications.  Refilled x3 months.  Follow-up in person in 3 months.  -     amphetamine-dextroAMPHetamine 5 MG tablet; Take 1 tablet (5 mg) by mouth daily.  Dispense: 30 tablet; Refill: 0  -     amphetamine-dextroAMPHetamine 5 MG tablet; Take 1 tablet (5 mg) by mouth daily.  Dispense: 30 tablet; Refill: 0  -     amphetamine-dextroAMPHetamine 5 MG tablet; TAKE ONE TABLET BY MOUTH ONE TIME DAILY  Dispense: 30 tablet; Refill: 0  -     lisdexamfetamine (Vyvanse) 70 MG capsule; Take 1 capsule (70 mg) by mouth every morning.  Dispense: 30 capsule; Refill: 0  -     lisdexamfetamine (Vyvanse) 70 MG capsule; Take 1 capsule (70 mg) by mouth every morning.  Dispense: 30 capsule; Refill: 0  -     lisdexamfetamine (Vyvanse) 70 MG capsule; Take 1 capsule (70 mg) by mouth every morning.  Dispense: 30 capsule; Refill: 0

## 2020-03-10 ENCOUNTER — Encounter (INDEPENDENT_AMBULATORY_CARE_PROVIDER_SITE_OTHER): Payer: PRIVATE HEALTH INSURANCE | Admitting: Family Medicine

## 2020-04-14 ENCOUNTER — Encounter (INDEPENDENT_AMBULATORY_CARE_PROVIDER_SITE_OTHER): Payer: Self-pay | Admitting: Family Medicine

## 2020-04-14 DIAGNOSIS — F902 Attention-deficit hyperactivity disorder, combined type: Secondary | ICD-10-CM

## 2020-04-14 MED ORDER — LISDEXAMFETAMINE DIMESYLATE 70 MG OR CAPS
70.0000 mg | ORAL_CAPSULE | Freq: Every morning | ORAL | 0 refills | Status: DC
Start: 2020-04-14 — End: 2020-05-15

## 2020-04-14 MED ORDER — LISDEXAMFETAMINE DIMESYLATE 70 MG OR CAPS
70.0000 mg | ORAL_CAPSULE | Freq: Every morning | ORAL | 0 refills | Status: DC
Start: 2020-05-10 — End: 2020-05-15

## 2020-04-14 NOTE — Telephone Encounter (Signed)
Please update insurance for pt and her daughter (separate chart [name]). Then I can send in prescriptions.

## 2020-04-14 NOTE — Telephone Encounter (Signed)
Insurance has been updated for both patient.

## 2020-04-24 NOTE — Telephone Encounter (Signed)
Received notice from [employer] that Vyvanse is not covered and requested be changed to a "one-step" med. Choices are dexmethylphenidate er caps, dextroamp/amphetamine er caps, dextroamphetamine er caps, Metadate ER or methylphenidate er caps/tabs

## 2020-04-29 NOTE — Telephone Encounter (Signed)
Left patient detailed message and reminder to read MyChart response from PCP.    Closing TE.

## 2020-04-29 NOTE — Telephone Encounter (Signed)
Please call pt and ask them to read their MyChart messages.

## 2020-05-15 ENCOUNTER — Encounter (INDEPENDENT_AMBULATORY_CARE_PROVIDER_SITE_OTHER): Payer: Self-pay | Admitting: Family Medicine

## 2020-05-15 ENCOUNTER — Telehealth (INDEPENDENT_AMBULATORY_CARE_PROVIDER_SITE_OTHER): Payer: PRIVATE HEALTH INSURANCE | Admitting: Family Medicine

## 2020-05-15 DIAGNOSIS — F902 Attention-deficit hyperactivity disorder, combined type: Secondary | ICD-10-CM

## 2020-05-15 DIAGNOSIS — F988 Other specified behavioral and emotional disorders with onset usually occurring in childhood and adolescence: Secondary | ICD-10-CM

## 2020-05-15 MED ORDER — DEXMETHYLPHENIDATE HCL ER 30 MG OR CP24
30.0000 mg | EXTENDED_RELEASE_CAPSULE | Freq: Every morning | ORAL | 0 refills | Status: DC
Start: 2020-05-15 — End: 2020-06-11

## 2020-05-15 NOTE — Progress Notes (Signed)
Distant Site Telemedicine Encounter    I conducted this encounter from [facility] via secure, live, face-to-face video conference with the patient. Lisa Flynn was located at [place], [place] with  daughter  Prior to the interview, the risks and benefits of telemedicine were discussed with the patient and verbal consent was obtained.      Consent  Due to the COVID-19 pandemic in [place], Telemedicine will be used.  As with any health care service, there are risks associated with the use of telemedicine, including equipment failure, poor image resolution, and information security issues.    Do you consent to the use of telemedicine in your medical care today? YES       Chief Complaint   Patient presents with    Attention Deficit Disorder            Subjective:     Lisa Flynn is a 45 year old female who presents on 05/15/2020     1.  ADD: happy on current dose of Vyvanse, used for years, but insurance requesting a trial of different medication prior to approving Vyvanse.  She has tried 3 other medications about 10 years ago, not tolerated, gave her palpitations.     Review of Systems   Constitutional: Negative for weight gain and weight loss.   Cardiovascular: Negative for chest pain and palpitations.   Psychiatric/Behavioral: The patient has insomnia.           Objective:    There were no vitals taken for this visit.    Physical Exam  Constitutional:       General: She is not in acute distress.     Appearance: She is normal weight.   Neurological:      Mental Status: She is alert.   Psychiatric:         Mood and Affect: Mood normal.         Behavior: Behavior normal.         Thought Content: Thought content normal.         Judgment: Judgment normal.       Assessment and Plan:      Lisa Flynn was seen today for attention deficit disorder.    Attention deficit disorder, unspecified hyperactivity presence:  Previous trials of Ritalin, Concerta and Adderral were not well tolerated, but pt's insurance requesting  trial of different medication.   Will try Focalin as below, sending in dose equivalent to previous Vyvanse.  F/u by phone in 2-4 weeks to determine how she is doing and if dose needs to be changed, or repeat submission for Vyvanse approval if not tolerated, or 2 more refills if doing well.   -     dexmethylphenidate ER 30 MG 24 hr capsule; Take 1 capsule (30 mg) by mouth every morning.  Dispense: 30 capsule; Refill: 0

## 2020-05-19 NOTE — Telephone Encounter (Signed)
Please call pt and ask them to read their MyChart messages.

## 2020-05-20 NOTE — Telephone Encounter (Signed)
Called patient to inform PCP advice & MyChart messag.  PT will update in 1-2 wks via mychart & schedule follow-up visit then,    No further action needed

## 2020-06-10 NOTE — Telephone Encounter (Signed)
Patient updating

## 2020-06-11 MED ORDER — LISDEXAMFETAMINE DIMESYLATE 70 MG OR CAPS
70.0000 mg | ORAL_CAPSULE | Freq: Every morning | ORAL | 0 refills | Status: DC
Start: 2020-06-11 — End: 2020-06-16

## 2020-06-16 MED ORDER — DEXMETHYLPHENIDATE HCL ER 35 MG OR CP24
30.0000 mg | EXTENDED_RELEASE_CAPSULE | Freq: Every morning | ORAL | 0 refills | Status: DC
Start: 2020-06-16 — End: 2020-07-25

## 2020-06-16 MED ORDER — AMPHETAMINE-DEXTROAMPHETAMINE 5 MG OR TABS
5.0000 mg | ORAL_TABLET | Freq: Every day | ORAL | 0 refills | Status: DC
Start: 2020-06-16 — End: 2020-07-25

## 2020-06-16 NOTE — Addendum Note (Signed)
Addended by: Krystal Clark on: 06/16/2020 02:18 PM     Modules accepted: Orders

## 2020-06-16 NOTE — Addendum Note (Signed)
Addended by: Krystal Clark on: 06/16/2020 03:00 PM     Modules accepted: Orders

## 2020-06-16 NOTE — Telephone Encounter (Signed)
Patient requesting rx refill:  Adderall  Pharmacy pended   Routing to PCP

## 2020-07-21 ENCOUNTER — Telehealth (INDEPENDENT_AMBULATORY_CARE_PROVIDER_SITE_OTHER): Payer: Self-pay | Admitting: Family Medicine

## 2020-07-23 ENCOUNTER — Other Ambulatory Visit (INDEPENDENT_AMBULATORY_CARE_PROVIDER_SITE_OTHER): Payer: Self-pay | Admitting: Family Medicine

## 2020-07-23 DIAGNOSIS — F988 Other specified behavioral and emotional disorders with onset usually occurring in childhood and adolescence: Secondary | ICD-10-CM

## 2020-07-23 DIAGNOSIS — F902 Attention-deficit hyperactivity disorder, combined type: Secondary | ICD-10-CM

## 2020-07-23 NOTE — Telephone Encounter (Signed)
Patient was informed one month ago when this was refilled that she would need follow-up.  Needs follow-up for refills. Please schedule telemedicine for this week or next week. Okay to use hold slot Friday with [facility] for this or schedule with Ousmane Seeman on Monday.

## 2020-07-23 NOTE — Telephone Encounter (Signed)
Pt has been scheduled.  °

## 2020-07-25 ENCOUNTER — Telehealth (INDEPENDENT_AMBULATORY_CARE_PROVIDER_SITE_OTHER): Payer: PRIVATE HEALTH INSURANCE | Admitting: Family

## 2020-07-25 VITALS — Wt 127.0 lb

## 2020-07-25 DIAGNOSIS — F900 Attention-deficit hyperactivity disorder, predominantly inattentive type: Secondary | ICD-10-CM

## 2020-07-25 MED ORDER — AMPHETAMINE-DEXTROAMPHETAMINE 5 MG OR TABS
5.0000 mg | ORAL_TABLET | Freq: Every afternoon | ORAL | 0 refills | Status: DC
Start: 2020-09-21 — End: 2020-09-30

## 2020-07-25 MED ORDER — DEXMETHYLPHENIDATE HCL ER 35 MG OR CP24
1.0000 | EXTENDED_RELEASE_CAPSULE | Freq: Every morning | ORAL | 0 refills | Status: AC
Start: 2020-07-25 — End: ?

## 2020-07-25 MED ORDER — AMPHETAMINE-DEXTROAMPHETAMINE 5 MG OR TABS
5.0000 mg | ORAL_TABLET | Freq: Every afternoon | ORAL | 0 refills | Status: AC
Start: 2020-08-23 — End: ?

## 2020-07-25 MED ORDER — AMPHETAMINE-DEXTROAMPHETAMINE 5 MG OR TABS
5.0000 mg | ORAL_TABLET | Freq: Every afternoon | ORAL | 0 refills | Status: AC
Start: 2020-07-25 — End: ?

## 2020-07-25 MED ORDER — DEXMETHYLPHENIDATE HCL ER 35 MG OR CP24
30.0000 mg | EXTENDED_RELEASE_CAPSULE | Freq: Every morning | ORAL | 0 refills | Status: DC
Start: 2020-09-21 — End: 2020-09-30

## 2020-07-25 MED ORDER — DEXMETHYLPHENIDATE HCL ER 35 MG OR CP24
1.0000 | EXTENDED_RELEASE_CAPSULE | Freq: Every morning | ORAL | 0 refills | Status: AC
Start: 2020-08-23 — End: ?

## 2020-07-25 NOTE — Progress Notes (Signed)
Consent:  You have chosen to receive care through the use of telemedicine. Telemedicine enables health care providers at different locations to provide safe, effective, and  convenient care through the use of technology. As with any health care service, there are risks associated with the use of telemedicine, including equipment failure, poor image resolution, and information security issues.    Do you understand the risks and benefits of telemedicine as I have explained them to you? Yes.    Have your questions regarding telemedicine been answered? Yes.    Do you consent to the use of telemedicine in your medical care today? Yes.      Distant Site Encounter  Is this visit being conducted using Telemedicine (live, interactive video and audio) or Telephone (audio only)? Telemedicine (live, interactive video & audio)     Distant Site Telemedicine Encounter    I conducted this encounter from [place] via secure, live, face-to-face video conference with the patient. Lisa Flynn was located at home. Prior to the interview, the risks and benefits of telemedicine were discussed with the patient and verbal consent was obtained.    12:56 PM  Lisa Flynn is a 45 year old year old female who presents for a telemedicine visit today for the following:    Chief Complaint   Patient presents with    Attention Deficit Disorder       SUBJECTIVE:   HPI:  1) ADHD:  Patient reports prior to starting medication, patient struggled with inattention, but denies troubles with hyperactivity and impulsivity.  Per last visit with PCP on 05/15/20, "happy on current dose of Vyvanse, used for years, but insurance requesting a trial of different medication prior to approving Vyvanse.  She has tried 3 other medications about 10 years ago, not tolerated, gave her palpitations."  At this last visit, she was prescribed Focalin to trial and advised to f/u via phone in 2-4 weeks. She then reached out on March 1 saying the med worse off too early.   PCP then tried to resent the Vyvanse again but again ran into insurance problems so Dr. Justice Deeds then tried increasing her generic Focalin prescription from 30 mg to 35 mg. She presents today to follow-up.      Patient currently takes generic Focalin ER 35 mg for ADHD in the mornings and short acting Adderall in the afternoon.  Reports the Focalin ER takes time to kick in she noticed because she takes med at 7:30 am and it doesn't kick in until she gets to work around 9 but it works and she can focus and complete tasks. Then at 5 pm she takes the short acting Adderall and this helps her finish work day. No trouble sleeping.    Reports she is going to try the new medication for a few more months.  Med works "I do my job and I do it well, but my mood is ... I am not my normally bubbly self".    Patient reports is overall doing well on current medications.  Patient reports perceived benefit from treatment.  Patient is able to complete tasks successfully with medication.  Patient denies poor appetite, difficulty sleeping, tic , irritability, mood lability, stomachache, headache, rebound symptoms, meds wear off too early and inadequate benefit.  Patient reports she is not as energetic though and not as "go go go as she used to be". Denies any struggles with hyperactivity. Pt then clarified that go go go meant energy to go out and do things  with her kids all the time, not that she was racing around or overly energetic.            Patient Active Problem List   Diagnosis    Alopecia areata    Headache    Presence of intrauterine contraceptive device    CONTROLLED SUBSTANCES PLAN ON FILE    Attention deficit disorder    Subclinical hyperthyroidism    Thyroid antibody positive    Low ferritin       I have reviewed the patients allergies, problem list and medications and updated the patients chart accordingly.         OBJECTIVE:  Wt 57.6 kg (127 lb) Comment: pt reported, taken 2 days ago   BMI 21.42 kg/m   General  appearance: healthy alert, well-appearing and no acute distress  Respiratory: Speaking in full sentences comfortably, Normal work of breathing and No cough during visit    Mental Status Examination:  - Dress, grooming, personal hygiene: dressed in clothing appropriate for situation, weather and age, well kempt  - Behavior: cooperative and pleasant  - Speech: clear, coherent  - Mood & Affect: Normal, congruent, good eye contact.  - Coherency and relevance of thought: normal: logical, coherent, goal-directed.  - Through content: normal and logical   - Orientation: intact to person, place and time   - Attention and concentration: alert  - Memory: not tested  - Insight & Judgment:grossly normal judgment and insight          ASSESSMENT/PLAN:  1. Attention deficit hyperactivity disorder (ADHD), predominantly inattentive type  Patient reports she is overall doing well on current medication management in that she is able to complete tasks successfully and in a timely manner at work.  However, she does report she does not feel like herself in terms of energy level to do things outside of work with this new medication.  Denies any other possible medication side effects.  Discussed possibility of some hyperactivity that is not being better managed but pt feels certain that she did not and does not struggle with hyperactivity with her ADHD.  Per discussion, pt would like to give the generic Focalin another 3 months of trial before deciding if she needs to try to get insurance to cover and change her back to the Vyvanse which she did feel more like herself on per her.  As a result, three months of meds refilled unchanged. Follow-up in 3 months, sooner if she decides medication is not adequate / she wants to try switching her medication.    - Dexmethylphenidate HCl ER 35 MG capsule extended-release 24 hour; Take 30 mg by mouth every morning.  Dispense: 30 capsule; Refill: 0  - Dexmethylphenidate HCl ER 35 MG capsule  extended-release 24 hour; Take 1 capsule by mouth every morning.  Dispense: 30 capsule; Refill: 0  - Dexmethylphenidate HCl ER 35 MG capsule extended-release 24 hour; Take 1 capsule by mouth every morning.  Dispense: 30 capsule; Refill: 0  - amphetamine-dextroAMPHetamine 5 MG tablet; Take 1 tablet (5 mg) by mouth every afternoon.  Dispense: 30 tablet; Refill: 0  - amphetamine-dextroAMPHetamine 5 MG tablet; Take 1 tablet (5 mg) by mouth every afternoon.  Dispense: 30 tablet; Refill: 0  - amphetamine-dextroAMPHetamine 5 MG tablet; Take 1 tablet (5 mg) by mouth every afternoon.  Dispense: 30 tablet; Refill: 0

## 2020-09-27 ENCOUNTER — Encounter (INDEPENDENT_AMBULATORY_CARE_PROVIDER_SITE_OTHER): Payer: Self-pay | Admitting: Family Medicine

## 2020-09-27 DIAGNOSIS — F900 Attention-deficit hyperactivity disorder, predominantly inattentive type: Secondary | ICD-10-CM

## 2020-09-30 NOTE — Telephone Encounter (Signed)
Last OV 4/15, next 10/02/20.  * Awaiting pt confirmation on which rx (s) requested refills for.

## 2020-09-30 NOTE — Telephone Encounter (Signed)
Medications and new mail order pharmacy pended

## 2020-10-01 MED ORDER — AMPHETAMINE-DEXTROAMPHETAMINE 5 MG OR TABS
5.0000 mg | ORAL_TABLET | Freq: Every day | ORAL | 0 refills | Status: DC
Start: 2020-10-01 — End: 2021-01-22

## 2020-10-01 MED ORDER — DEXMETHYLPHENIDATE HCL ER 35 MG OR CP24
30.0000 mg | EXTENDED_RELEASE_CAPSULE | Freq: Every morning | ORAL | 0 refills | Status: DC
Start: 2020-10-01 — End: 2020-10-02

## 2020-10-02 ENCOUNTER — Telehealth (INDEPENDENT_AMBULATORY_CARE_PROVIDER_SITE_OTHER): Payer: Self-pay | Admitting: Family Medicine

## 2020-10-02 ENCOUNTER — Encounter (INDEPENDENT_AMBULATORY_CARE_PROVIDER_SITE_OTHER): Payer: Self-pay | Admitting: Family Medicine

## 2020-10-02 ENCOUNTER — Other Ambulatory Visit (INDEPENDENT_AMBULATORY_CARE_PROVIDER_SITE_OTHER): Payer: Self-pay | Admitting: Family Medicine

## 2020-10-02 ENCOUNTER — Telehealth (INDEPENDENT_AMBULATORY_CARE_PROVIDER_SITE_OTHER): Payer: PRIVATE HEALTH INSURANCE | Admitting: Family Medicine

## 2020-10-02 DIAGNOSIS — F988 Other specified behavioral and emotional disorders with onset usually occurring in childhood and adolescence: Secondary | ICD-10-CM

## 2020-10-02 DIAGNOSIS — F900 Attention-deficit hyperactivity disorder, predominantly inattentive type: Secondary | ICD-10-CM

## 2020-10-02 MED ORDER — DEXMETHYLPHENIDATE HCL ER 35 MG OR CP24
35.0000 mg | EXTENDED_RELEASE_CAPSULE | Freq: Every morning | ORAL | 0 refills | Status: DC
Start: 2020-10-02 — End: 2021-01-22

## 2020-10-02 NOTE — Progress Notes (Signed)
Distant Site Telemedicine Encounter      I conducted this encounter via secure, live, face-to-face video conference with the patient. I reviewed the risks and benefits of telemedicine as pertinent to this visit and the patient agreed to proceed.    Provider Location: Off-site location (home, non-[place] location)  Patient Location: At home  Present with patient: No one else present          Chief Complaint   Patient presents with    Medication Review      Subjective:     Lisa Flynn is a 45 year old female who presents on 10/02/2020     1.  ADD:  Here for follow up on Focalin, overall happy with current dose, able to finish tests, although still would prefer Vyvanse since she felt that started working faster.  She has to take it 1 hour before going to work.   Takes 5 mg booster at about 4PM.  Sleeping well.   Last BP about 1 month ago was normal.  No weight loss.     Review of Systems   Constitutional: Positive for weight loss.   Psychiatric/Behavioral: Negative for depression. The patient does not have insomnia and is not nervous/anxious.        Objective:    There were no vitals taken for this visit.    Physical Exam  Constitutional:       General: She is not in acute distress.     Appearance: Normal appearance.   Pulmonary:      Effort: Pulmonary effort is normal. No respiratory distress.   Neurological:      Mental Status: She is alert.   Psychiatric:         Mood and Affect: Mood normal.         Behavior: Behavior normal.         Thought Content: Thought content normal.         Judgment: Judgment normal.           Assessment and Plan:      Girlie was seen today for medication review.    Attention deficit disorder, unspecified hyperactivity presence  Stable and overall well controlled on current medications.  55-monthprescription sent to mail order pharmacy yesterday.  Encourage patient to schedule a wellness visit with med check in about 3 months.    Answers for HPI/ROS submitted by the patient on  10/02/2020  [facility] LtdScore: 0

## 2020-10-02 NOTE — Telephone Encounter (Signed)
Lisa Flynn calling to get clarification on the Dexmethylphenidate HCI ER 35 MG. Per directions its states to take 30 mg.  Routing to pcp to advise.

## 2020-10-02 NOTE — Telephone Encounter (Signed)
Provider corrected and resent

## 2020-10-02 NOTE — Progress Notes (Signed)
Agree, this was in error.  Please let them know a new rx was sent and to cancel yesterday's rx.

## 2020-10-02 NOTE — Telephone Encounter (Signed)
Patient confirmed rx refill(s) request, PCP sent 6/22 to [facility]:    amphetamine-dextroAMPHetamine 5 MG tablet  Dexmethylphenidate HCl ER 35 MG capsule extended-release 24 hour    No further action needed.

## 2020-12-18 ENCOUNTER — Encounter (INDEPENDENT_AMBULATORY_CARE_PROVIDER_SITE_OTHER): Payer: Self-pay | Admitting: Family Medicine

## 2020-12-18 NOTE — Telephone Encounter (Signed)
Routing as FYI

## 2021-01-22 ENCOUNTER — Encounter (INDEPENDENT_AMBULATORY_CARE_PROVIDER_SITE_OTHER): Payer: Self-pay | Admitting: Family Medicine

## 2021-01-22 DIAGNOSIS — F900 Attention-deficit hyperactivity disorder, predominantly inattentive type: Secondary | ICD-10-CM

## 2021-01-22 MED ORDER — AMPHETAMINE-DEXTROAMPHETAMINE 5 MG OR TABS
5.0000 mg | ORAL_TABLET | Freq: Every day | ORAL | 0 refills | Status: AC
Start: 2021-01-22 — End: 2021-02-21

## 2021-01-22 MED ORDER — DEXMETHYLPHENIDATE HCL ER 35 MG OR CP24
35.0000 mg | EXTENDED_RELEASE_CAPSULE | Freq: Every morning | ORAL | 0 refills | Status: AC
Start: 2021-01-22 — End: 2021-02-21

## 2021-01-22 NOTE — Telephone Encounter (Signed)
Pt is requesting refill for ADHD medications. Can controlled substances be prescribed over state lines?   Routing to provider to advise

## 2021-01-22 NOTE — Telephone Encounter (Signed)
Rx and pharmacy pended  Routing to provider

## 2021-01-22 NOTE — Telephone Encounter (Signed)
Please pend medications and pharmacy.

## 2021-03-03 ENCOUNTER — Encounter: Payer: Self-pay | Admitting: Nurse Practitioner

## 2021-03-03 ENCOUNTER — Other Ambulatory Visit: Payer: Self-pay

## 2021-03-03 ENCOUNTER — Ambulatory Visit (INDEPENDENT_AMBULATORY_CARE_PROVIDER_SITE_OTHER): Payer: Managed Care, Other (non HMO) | Admitting: Nurse Practitioner

## 2021-03-03 VITALS — BP 133/88 | HR 100 | Ht 65.0 in | Wt 138.0 lb

## 2021-03-03 DIAGNOSIS — Z0000 Encounter for general adult medical examination without abnormal findings: Secondary | ICD-10-CM | POA: Insufficient documentation

## 2021-03-03 DIAGNOSIS — Z79899 Other long term (current) drug therapy: Secondary | ICD-10-CM | POA: Insufficient documentation

## 2021-03-03 DIAGNOSIS — F988 Other specified behavioral and emotional disorders with onset usually occurring in childhood and adolescence: Secondary | ICD-10-CM | POA: Diagnosis not present

## 2021-03-03 MED ORDER — DEXMETHYLPHENIDATE HCL ER 35 MG PO CP24: 35.0000 mg | ORAL_CAPSULE | Freq: Every morning | ORAL | 0 refills | Status: DC

## 2021-03-03 MED ORDER — DEXMETHYLPHENIDATE HCL 5 MG PO TABS: 5.0000 mg | ORAL_TABLET | Freq: Two times a day (BID) | ORAL | 0 refills | Status: DC

## 2021-03-03 NOTE — Assessment & Plan Note (Signed)
Patient is establishing care today.  Completed assessment and provided education for preventative and health maintenance.  Patient will reschedule for complete physical exam with labs

## 2021-03-03 NOTE — Assessment & Plan Note (Signed)
Patient establishing care today and refill in ADD medication.  Completed urine drug screen results pending.  Sent enough to cover patient until tox assure results come back.  Education provided to patient on living with ADD printed handouts given. Rx sent to pharmacy.

## 2021-03-03 NOTE — Assessment & Plan Note (Signed)
Signed patient's copy handed to patient.

## 2021-03-03 NOTE — Patient Instructions (Signed)
Living With Attention Deficit Hyperactivity Disorder If you have been diagnosed with attention deficit hyperactivity disorder (ADHD), you may be relieved that you now know why you have felt or behaved a certain way. Still, you may feel overwhelmed about the treatment ahead. You may also wonder how to get the support you need and how to deal with the condition day-to-day. With treatment and support, you can live with ADHD and manage your symptoms. How to manage lifestyle changes Managing stress Stress is your body's reaction to life changes and events, both good and bad. To cope with the stress of an ADHD diagnosis, it may help to: Learn more about ADHD. Exercise regularly. Even a short daily walk can lower stress levels. Participate in training or education programs (including social skills training classes) that teach you to deal with symptoms.  Medicines Your health care provider may suggest certain medicines if he or she feels that they will help to improve your condition. Stimulant medicines are usually prescribed to treat ADHD, and therapy may also be prescribed. It is important to: Avoid using alcohol and other substances that may prevent your medicines from working properly (may interact). Talk with your pharmacist or health care provider about all the medicines that you take, their possible side effects, and what medicines are safe to take together. Make it your goal to take part in all treatment decisions (shared decision-making). Ask about possible side effects of medicines that your health care provider recommends, and tell him or her how you feel about having those side effects. It is best if shared decision-making with your health care provider is part of your total treatment plan. Relationships To strengthen your relationships with family members while treating your condition, consider taking part in family therapy. You might also attend self-help groups alone or with a loved one. Be  honest about how your symptoms affect your relationships. Make an effort to communicate respectfully instead of fighting, and find ways to show others that you care. Psychotherapy may be useful in helping you cope with how ADHD affects your relationships. How to recognize changes in your condition The following signs may mean that your treatment is working well and your condition is improving: Consistently being on time for appointments. Being more organized at home and work. Other people noticing improvements in your behavior. Achieving goals that you set for yourself. Thinking more clearly. The following signs may mean that your treatment is not working very well: Feeling impatience or more confusion. Missing, forgetting, or being late for appointments. An increasing sense of disorganization and messiness. More difficulty in reaching goals that you set for yourself. Loved ones becoming angry or frustrated with you. Follow these instructions at home: Take over-the-counter and prescription medicines only as told by your health care provider. Check with your health care provider before taking any new medicines. Create structure and an organized atmosphere at home. For example: Make a list of tasks, then rank them from most important to least important. Work on one task at a time until your listed tasks are done. Make a daily schedule and follow it consistently every day. Use an appointment calendar, and check it 2 or 3 times a day to keep on track. Keep it with you when you leave the house. Create spaces where you keep certain things, and always put things back in their places after you use them. Keep all follow-up visits as told by your health care provider. This is important. Where to find support Talking to others  Keep emotion out of important discussions and speak in a calm, logical way. Listen closely and patiently to your loved ones. Try to understand their point of view, and try to  avoid getting defensive. Take responsibility for the consequences of your actions. Ask that others do not take your behaviors personally. Aim to solve problems as they come up, and express your feelings instead of bottling them up. Talk openly about what you need from your loved ones and how they can support you. Consider going to family therapy sessions or having your family meet with a specialist who deals with ADHD-related behavior problems. Finances Not all insurance plans cover mental health care, so it is important to check with your insurance carrier. If paying for co-pays or counseling services is a problem, search for a local or county mental health care center. Public mental health care services may be offered there at a low cost or no cost when you are not able to see a private health care provider. If you are taking medicine for ADHD, you may be able to get the generic form, which may be less expensive than brand-name medicine. Some makers of prescription medicines also offer help to patients who cannot afford the medicines that they need. Questions to ask your health care provider: What are the risks and benefits of taking medicines? Would I benefit from therapy? How often should I follow up with a health care provider? Contact a health care provider if: You have side effects from your medicines, such as: Repeated muscle twitches, coughing, or speech outbursts. Sleep problems. Loss of appetite. Depression. New or worsening behavior problems. Dizziness. Unusually fast heartbeat. Stomach pains. Headaches. Get help right away if: You have a severe reaction to a medicine. Your behavior suddenly gets worse. Summary With treatment and support, you can live with ADHD and manage your symptoms. The medicines that are most often prescribed for ADHD are stimulants. Consider taking part in family therapy or self-help groups with family members or friends. When you talk with friends  and family about your ADHD, be patient and communicate openly. Take over-the-counter and prescription medicines only as told by your health care provider. Check with your health care provider before taking any new medicines. This information is not intended to replace advice given to you by your health care provider. Make sure you discuss any questions you have with your health care provider. Document Revised: 09/12/2019 Document Reviewed: 09/12/2019 Elsevier Patient Education  2022 [employer].

## 2021-03-03 NOTE — Progress Notes (Signed)
New Patient Note  RE: Kristen Hanna MRN: [id number] DOB: 03-29-1976 Date of Office Visit: 03/03/2021  Chief Complaint: ADHD  History of Present Illness:   Received request for refill of ADHD Medication. Kristen Lynn, NP Current Outpatient Medications  Medication Sig Dispense Refill   dexmethylphenidate (FOCALIN) 5 MG tablet Take 1 tablet (5 mg total) by mouth 2 (two) times daily. 30 tablet 0   Dexmethylphenidate HCl 35 MG CP24 Take 35 mg by mouth in the morning. 30 capsule 0   No current facility-administered medications for this visit.   Last refill date of Pyschostimulants: Adderall October 03/2021 and Adderall XR January 21 2021 Medication: # dispensed: 30 # days of med left: 0 To be picked up at no pharmacy.  Does Kristen Hanna seem to have any problems with moodiness, appetite, weight loss, or sleep? no Any complaints by Kristen Hanna about taking the medications? no When was she last examined for ADD? 3 months Who is she seeing for his ADHD symptoms? Psychiatrist  When is her next appointment due? Today  Rx request routed to Dr.: Dr Claretta Fraise today for cosign.   patient is establishing care today and will complete UDS, and continue with current medication. I will cover a 1 month supply until  UDS results come back.  Assessment and Plan: Kristen Hanna is a 45 y.o. female with: Attention deficit disorder (ADD) without hyperactivity Patient establishing care today and refill in ADD medication.  Completed urine drug screen results pending.  Sent enough to cover patient until tox assure results come back.  Education provided to patient on living with ADD printed handouts given. Rx sent to pharmacy.  Encounter for medical examination to establish care Patient is establishing care today.  Completed assessment and provided education for preventative and health maintenance.  Patient will reschedule for complete physical exam with labs     Controlled substance agreement  signed Signed patient's copy handed to patient.  Return in about 3 months (around 06/03/2021).   Diagnostics:   Past Medical History: Patient Active Problem List   Diagnosis Date Noted   Controlled substance agreement signed 03/03/2021   Attention deficit disorder (ADD) without hyperactivity 03/03/2021   Encounter for medical examination to establish care 03/03/2021   Past Medical History:  Diagnosis Date   ADHD    Past Surgical History: Past Surgical History:  Procedure Laterality Date   NO PAST SURGERIES     Medication List:  Current Outpatient Medications  Medication Sig Dispense Refill   dexmethylphenidate (FOCALIN) 5 MG tablet Take 1 tablet (5 mg total) by mouth 2 (two) times daily. 30 tablet 0   Dexmethylphenidate HCl 35 MG CP24 Take 35 mg by mouth in the morning. 30 capsule 0   No current facility-administered medications for this visit.   Allergies: No Known Allergies Social History: Social History   Socioeconomic History   Marital status: Divorced    Spouse name: Not on file   Number of children: Not on file   Years of education: Not on file   Highest education level: Not on file  Occupational History   Not on file  Tobacco Use   Smoking status: Never   Smokeless tobacco: Never  Substance and Sexual Activity   Alcohol use: Yes    Alcohol/week: 7.0 standard drinks    Types: 7 Cans of beer per week   Drug use: Never   Sexual activity: Not Currently    Birth control/protection: I.U.D.  Other Topics Concern   Not on  file  Social History Narrative   Not on file   Social Determinants of Health   Financial Resource Strain: Not on file  Food Insecurity: Not on file  Transportation Needs: Not on file  Physical Activity: Not on file  Stress: Not on file  Social Connections: Not on file       Family History: History reviewed. No pertinent family history.       Review of Systems  Constitutional: Negative.   HENT: Negative.    Eyes:  Negative.   Respiratory: Negative.    Gastrointestinal: Negative.   Genitourinary: Negative.   Skin: Negative.   Psychiatric/Behavioral: Negative.    All other systems reviewed and are negative.   Objective: BP 133/88   Pulse 100   Ht 5' 5"  (1.651 m)   Wt 138 lb (62.6 kg)   SpO2 100%   BMI 22.96 kg/m  Body mass index is 22.96 kg/m. Physical Exam Vitals and nursing note reviewed.  Constitutional:      Appearance: Normal appearance.  HENT:     Head: Normocephalic.     Right Ear: External ear normal.     Left Ear: External ear normal.     Mouth/Throat:     Mouth: Mucous membranes are moist.     Pharynx: Oropharynx is clear.  Eyes:     Conjunctiva/sclera: Conjunctivae normal.  Cardiovascular:     Rate and Rhythm: Normal rate and regular rhythm.     Pulses: Normal pulses.     Heart sounds: Normal heart sounds.  Pulmonary:     Effort: Pulmonary effort is normal.     Breath sounds: Normal breath sounds.  Abdominal:     General: Bowel sounds are normal.  Musculoskeletal:        General: Normal range of motion.  Skin:    General: Skin is warm.  Neurological:     Mental Status: She is alert and oriented to person, place, and time.  Psychiatric:        Behavior: Behavior normal.   The plan was reviewed with the patient/family, and all questions/concerned were addressed.  It was my pleasure to see Kristen Hanna today and participate in her care. Please feel free to contact me with any questions or concerns.  Sincerely,  Jac Canavan NP [facility]

## 2021-03-11 LAB — TOXASSURE SELECT 13 (MW), URINE

## 2021-03-15 ENCOUNTER — Other Ambulatory Visit: Payer: Self-pay | Admitting: Nurse Practitioner

## 2021-03-15 DIAGNOSIS — F988 Other specified behavioral and emotional disorders with onset usually occurring in childhood and adolescence: Secondary | ICD-10-CM

## 2021-03-15 MED ORDER — DEXMETHYLPHENIDATE HCL 5 MG PO TABS: 5.0000 mg | ORAL_TABLET | Freq: Two times a day (BID) | ORAL | 0 refills | Status: DC

## 2021-03-15 MED ORDER — DEXMETHYLPHENIDATE HCL ER 35 MG PO CP24: 35.0000 mg | ORAL_CAPSULE | Freq: Every day | ORAL | 0 refills | Status: DC

## 2021-03-15 MED ORDER — DEXMETHYLPHENIDATE HCL ER 35 MG PO CP24: 30.0000 mg | ORAL_CAPSULE | Freq: Every day | ORAL | 0 refills | Status: DC

## 2021-03-15 NOTE — Progress Notes (Signed)
Medication refill sent to pharmacy, follow up in 3 months around 06/01/2021

## 2021-06-03 ENCOUNTER — Ambulatory Visit: Payer: Managed Care, Other (non HMO) | Admitting: Nurse Practitioner

## 2021-06-18 ENCOUNTER — Ambulatory Visit (INDEPENDENT_AMBULATORY_CARE_PROVIDER_SITE_OTHER): Payer: Managed Care, Other (non HMO) | Admitting: Nurse Practitioner

## 2021-06-18 ENCOUNTER — Other Ambulatory Visit: Payer: Self-pay

## 2021-06-18 ENCOUNTER — Encounter: Payer: Self-pay | Admitting: Nurse Practitioner

## 2021-06-18 VITALS — BP 102/69 | HR 93 | Temp 97.8°F | Ht 64.96 in | Wt 138.5 lb

## 2021-06-18 DIAGNOSIS — Z7689 Persons encountering health services in other specified circumstances: Secondary | ICD-10-CM | POA: Diagnosis not present

## 2021-06-18 DIAGNOSIS — N951 Menopausal and female climacteric states: Secondary | ICD-10-CM | POA: Diagnosis not present

## 2021-06-18 DIAGNOSIS — F988 Other specified behavioral and emotional disorders with onset usually occurring in childhood and adolescence: Secondary | ICD-10-CM | POA: Diagnosis not present

## 2021-06-18 MED ORDER — DEXMETHYLPHENIDATE HCL 5 MG PO TABS: 5.0000 mg | ORAL_TABLET | Freq: Two times a day (BID) | ORAL | 0 refills | Status: DC

## 2021-06-18 MED ORDER — DEXMETHYLPHENIDATE HCL ER 35 MG PO CP24
30.0000 mg | ORAL_CAPSULE | Freq: Every day | ORAL | 0 refills | Status: DC
Start: 2021-06-18 — End: 2021-07-15

## 2021-06-18 NOTE — Progress Notes (Signed)
New Patient Office Visit  Subjective:  Patient ID: Kristen Hanna, female    DOB: Sep 22, 1975  Age: 46 y.o. MRN: [id number]  CC:  Chief Complaint  Patient presents with   New Patient (Initial Visit)    HPI Kristen Hanna presents to establish new primary care provider. She moved to this area from [place] over the summer of 2022.  -the patient has history of ADHD. She is currently taking Focalin XR 35mg  daily and takes regular Focalin 5mg  in the afternoon if needed.  She was on Vyvanse In the past. When insurance changed, she had to change to Focalin. Doesn't feel like it works quite as well.  -concern for menopause - been missing periods and having hot flashes. Not currently sexually active.  -due to have CPE with pap  -due to have screening mammogram.   Past Medical History:  Diagnosis Date   ADHD     Past Surgical History:  Procedure Laterality Date   NO PAST SURGERIES      History reviewed. No pertinent family history.  Social History   Socioeconomic History   Marital status: Divorced    Spouse name: Not on file   Number of children: Not on file   Years of education: Not on file   Highest education level: Not on file  Occupational History   Not on file  Tobacco Use   Smoking status: Never   Smokeless tobacco: Never  Substance and Sexual Activity   Alcohol use: Yes    Alcohol/week: 7.0 standard drinks    Types: 7 Cans of beer per week   Drug use: Never   Sexual activity: Not Currently    Birth control/protection: I.U.D.  Other Topics Concern   Not on file  Social History Narrative   Not on file   Social Determinants of Health   Financial Resource Strain: Not on file  Food Insecurity: Not on file  Transportation Needs: Not on file  Physical Activity: Not on file  Stress: Not on file  Social Connections: Not on file  Intimate Partner Violence: Not on file    ROS Review of Systems  Constitutional:  Negative for activity change,  appetite change, chills, fatigue and fever.  HENT:  Negative for congestion, postnasal drip, rhinorrhea, sinus pressure, sinus pain, sneezing and sore throat.   Eyes: Negative.   Respiratory:  Negative for cough, chest tightness, shortness of breath and wheezing.   Cardiovascular:  Negative for chest pain and palpitations.  Gastrointestinal:  Negative for abdominal pain, constipation, diarrhea, nausea and vomiting.  Endocrine: Negative for cold intolerance, heat intolerance, polydipsia and polyuria.  Genitourinary:  Negative for dyspareunia, dysuria, flank pain, frequency and urgency.  Musculoskeletal:  Negative for arthralgias, back pain and myalgias.  Skin:  Negative for rash.  Allergic/Immunologic: Negative for environmental allergies.  Neurological:  Negative for dizziness, weakness and headaches.  Hematological:  Negative for adenopathy.  Psychiatric/Behavioral:  Positive for decreased concentration. The patient is not nervous/anxious.    Objective:   Today's Vitals   06/18/21 0902  BP: 102/69  Pulse: 93  Temp: 97.8 F (36.6 C)  SpO2: 100%  Weight: 138 lb 8 oz (62.8 kg)  Height: 5' 4.96" (1.65 m)   Body mass index is 23.08 kg/m.   Physical Exam Vitals and nursing note reviewed.  Constitutional:      Appearance: Normal appearance. She is well-developed.  HENT:     Head: Normocephalic and atraumatic.  Eyes:     Pupils: Pupils  are equal, round, and reactive to light.  Cardiovascular:     Rate and Rhythm: Normal rate and regular rhythm.     Pulses: Normal pulses.     Heart sounds: Normal heart sounds.  Pulmonary:     Effort: Pulmonary effort is normal.     Breath sounds: Normal breath sounds.  Abdominal:     Palpations: Abdomen is soft.  Musculoskeletal:        General: Normal range of motion.     Cervical back: Normal range of motion and neck supple.  Lymphadenopathy:     Cervical: No cervical adenopathy.  Skin:    General: Skin is warm and dry.     Capillary  Refill: Capillary refill takes less than 2 seconds.  Neurological:     General: No focal deficit present.     Mental Status: She is alert and oriented to person, place, and time.  Psychiatric:        Mood and Affect: Mood normal.        Behavior: Behavior normal.        Thought Content: Thought content normal.        Judgment: Judgment normal.    Assessment & Plan:  1. Attention deficit disorder (ADD) without hyperactivity We will continue ADD medications as currently prescribed.  Continue Focalin (extended release) 35 mg.  Take daily as needed.  May take Focalin 5 mg tablets twice daily if needed for focus and attention.  Will administer ASRS 1.1 self-assessment for ADD at next visit.  Will give prescriptions for 90 days at that time. - dexmethylphenidate (FOCALIN) 5 MG tablet; Take 1 tablet (5 mg total) by mouth 2 (two) times daily.  Dispense: 30 tablet; Refill: 0 - Dexmethylphenidate HCl 35 MG CP24; Take 30 mg by mouth daily.  Dispense: 30 capsule; Refill: 0  2. Perimenopause We will check reproductive hormones prior to next visit.  Discussed results with patient when they are available.  3. Encounter to establish care Appointment today to establish new primary care provider we will get medical records from previous primary care provider to review and update patient chart.   Problem List Items Addressed This Visit       Other   Attention deficit disorder (ADD) without hyperactivity - Primary   Relevant Medications   dexmethylphenidate (FOCALIN) 5 MG tablet   Dexmethylphenidate HCl 35 MG CP24   Perimenopause   Other Visit Diagnoses     Encounter to establish care           Outpatient Encounter Medications as of 06/18/2021  Medication Sig   [DISCONTINUED] dexmethylphenidate (FOCALIN) 5 MG tablet Take 1 tablet (5 mg total) by mouth 2 (two) times daily.   [DISCONTINUED] dexmethylphenidate (FOCALIN) 5 MG tablet Take 1 tablet (5 mg total) by mouth 2 (two) times daily.    [DISCONTINUED] Dexmethylphenidate HCl 35 MG CP24 Take 30 mg by mouth daily.   [DISCONTINUED] Dexmethylphenidate HCl 35 MG CP24 Take 35 mg by mouth daily.   dexmethylphenidate (FOCALIN) 5 MG tablet Take 1 tablet (5 mg total) by mouth 2 (two) times daily.   Dexmethylphenidate HCl 35 MG CP24 Take 30 mg by mouth daily.   [DISCONTINUED] amphetamine-dextroamphetamine (ADDERALL) 5 MG tablet Take 1 tablet by mouth daily.   No facility-administered encounter medications on file as of 06/18/2021.    Follow-up: Return in about 4 weeks (around 07/16/2021), or cpe with pap - ADD ASRS 1.1 assessment, for fbw with free T$, vitamin d, fsh/lh, and estradiol levels  -  labs a week before.   Carlean Jews, NP

## 2021-07-07 ENCOUNTER — Other Ambulatory Visit: Payer: Self-pay

## 2021-07-07 DIAGNOSIS — N951 Menopausal and female climacteric states: Secondary | ICD-10-CM

## 2021-07-07 DIAGNOSIS — E569 Vitamin deficiency, unspecified: Secondary | ICD-10-CM

## 2021-07-07 DIAGNOSIS — Z0000 Encounter for general adult medical examination without abnormal findings: Secondary | ICD-10-CM

## 2021-07-07 DIAGNOSIS — E039 Hypothyroidism, unspecified: Secondary | ICD-10-CM

## 2021-07-09 ENCOUNTER — Other Ambulatory Visit: Payer: Managed Care, Other (non HMO)

## 2021-07-09 DIAGNOSIS — E039 Hypothyroidism, unspecified: Secondary | ICD-10-CM

## 2021-07-09 DIAGNOSIS — N951 Menopausal and female climacteric states: Secondary | ICD-10-CM

## 2021-07-09 DIAGNOSIS — E569 Vitamin deficiency, unspecified: Secondary | ICD-10-CM

## 2021-07-09 DIAGNOSIS — Z0000 Encounter for general adult medical examination without abnormal findings: Secondary | ICD-10-CM

## 2021-07-10 LAB — COMPREHENSIVE METABOLIC PANEL
ALT: 16 IU/L (ref 0–32)
AST: 22 IU/L (ref 0–40)
Albumin/Globulin Ratio: 1.5 (ref 1.2–2.2)
Albumin: 4.2 g/dL (ref 3.8–4.8)
Alkaline Phosphatase: 61 IU/L (ref 44–121)
BUN/Creatinine Ratio: 17 (ref 9–23)
BUN: 12 mg/dL (ref 6–24)
Bilirubin Total: 0.4 mg/dL (ref 0.0–1.2)
CO2: 23 mmol/L (ref 20–29)
Calcium: 8.8 mg/dL (ref 8.7–10.2)
Chloride: 99 mmol/L (ref 96–106)
Creatinine, Ser: 0.71 mg/dL (ref 0.57–1.00)
Globulin, Total: 2.8 g/dL (ref 1.5–4.5)
Glucose: 98 mg/dL (ref 70–99)
Potassium: 4 mmol/L (ref 3.5–5.2)
Sodium: 137 mmol/L (ref 134–144)
Total Protein: 7 g/dL (ref 6.0–8.5)
eGFR: 107 mL/min/{1.73_m2} (ref >59)

## 2021-07-10 LAB — LIPID PANEL
Chol/HDL Ratio: 1.8 ratio (ref 0.0–4.4)
Cholesterol, Total: 194 mg/dL (ref 100–199)
HDL: 106 mg/dL (ref >39)
LDL Chol Calc (NIH): 76 mg/dL (ref 0–99)
Triglycerides: 63 mg/dL (ref 0–149)
VLDL Cholesterol Cal: 12 mg/dL (ref 5–40)

## 2021-07-10 LAB — T4, FREE: Free T4: 1.08 ng/dL (ref 0.82–1.77)

## 2021-07-10 LAB — CBC WITH DIFFERENTIAL/PLATELET
Basophils Absolute: 0.1 10*3/uL (ref 0.0–0.2)
Basos: 1 %
EOS (ABSOLUTE): 0.2 10*3/uL (ref 0.0–0.4)
Eos: 4 %
Hematocrit: 36 % (ref 34.0–46.6)
Hemoglobin: 12.1 g/dL (ref 11.1–15.9)
Immature Grans (Abs): 0 10*3/uL (ref 0.0–0.1)
Immature Granulocytes: 0 %
Lymphocytes Absolute: 1.7 10*3/uL (ref 0.7–3.1)
Lymphs: 33 %
MCH: 32.4 pg (ref 26.6–33.0)
MCHC: 33.6 g/dL (ref 31.5–35.7)
MCV: 97 fL (ref 79–97)
Monocytes Absolute: 0.4 10*3/uL (ref 0.1–0.9)
Monocytes: 8 %
Neutrophils Absolute: 2.8 10*3/uL (ref 1.4–7.0)
Neutrophils: 54 %
Platelets: 241 10*3/uL (ref 150–450)
RBC: 3.73 x10E6/uL — ABNORMAL LOW (ref 3.77–5.28)
RDW: 12.4 % (ref 11.7–15.4)
WBC: 5.1 10*3/uL (ref 3.4–10.8)

## 2021-07-10 LAB — TSH: TSH: 3.99 u[IU]/mL (ref 0.450–4.500)

## 2021-07-10 LAB — HEMOGLOBIN A1C
Est. average glucose Bld gHb Est-mCnc: 88 mg/dL
Hgb A1c MFr Bld: 4.7 % — ABNORMAL LOW (ref 4.8–5.6)

## 2021-07-10 LAB — ESTRADIOL: Estradiol: 282 pg/mL

## 2021-07-10 LAB — FSH/LH
FSH: 10.6 m[IU]/mL
LH: 19 m[IU]/mL

## 2021-07-10 LAB — VITAMIN D 25 HYDROXY (VIT D DEFICIENCY, FRACTURES): Vit D, 25-Hydroxy: 5.7 ng/mL — ABNORMAL LOW (ref 30.0–100.0)

## 2021-07-10 NOTE — Progress Notes (Signed)
Vitamin d deficiency. Treat with drisdol weekly for next several months. Other labs essentially normal. Discuss at visit 07/20/2021.

## 2021-07-15 ENCOUNTER — Other Ambulatory Visit: Payer: Self-pay | Admitting: Nurse Practitioner

## 2021-07-15 DIAGNOSIS — F988 Other specified behavioral and emotional disorders with onset usually occurring in childhood and adolescence: Secondary | ICD-10-CM

## 2021-07-16 ENCOUNTER — Encounter: Payer: Self-pay | Admitting: Nurse Practitioner

## 2021-07-16 ENCOUNTER — Other Ambulatory Visit: Payer: Self-pay | Admitting: Nurse Practitioner

## 2021-07-16 DIAGNOSIS — F988 Other specified behavioral and emotional disorders with onset usually occurring in childhood and adolescence: Secondary | ICD-10-CM

## 2021-07-16 MED ORDER — DEXMETHYLPHENIDATE HCL 5 MG PO TABS
5.0000 mg | ORAL_TABLET | Freq: Two times a day (BID) | ORAL | 0 refills | Status: DC
Start: 2021-07-16 — End: 2021-08-24

## 2021-07-16 MED ORDER — DEXMETHYLPHENIDATE HCL ER 35 MG PO CP24: 30.0000 mg | ORAL_CAPSULE | Freq: Every day | ORAL | 0 refills | Status: DC

## 2021-07-17 ENCOUNTER — Encounter: Payer: Managed Care, Other (non HMO) | Admitting: Nurse Practitioner

## 2021-07-20 ENCOUNTER — Encounter: Payer: Self-pay | Admitting: Nurse Practitioner

## 2021-07-20 ENCOUNTER — Other Ambulatory Visit (HOSPITAL_COMMUNITY)
Admission: RE | Admit: 2021-07-20 | Discharge: 2021-07-20 | Disposition: A | Discharge status: Home / Self Care | Payer: Managed Care, Other (non HMO) | Source: Ambulatory Visit | Attending: Nurse Practitioner | Admitting: Nurse Practitioner

## 2021-07-20 ENCOUNTER — Ambulatory Visit (INDEPENDENT_AMBULATORY_CARE_PROVIDER_SITE_OTHER): Payer: Managed Care, Other (non HMO) | Admitting: Nurse Practitioner

## 2021-07-20 VITALS — BP 117/75 | HR 78 | Temp 98.0°F | Ht 64.96 in | Wt 144.3 lb

## 2021-07-20 DIAGNOSIS — F988 Other specified behavioral and emotional disorders with onset usually occurring in childhood and adolescence: Secondary | ICD-10-CM | POA: Diagnosis not present

## 2021-07-20 DIAGNOSIS — Z01419 Encounter for gynecological examination (general) (routine) without abnormal findings: Secondary | ICD-10-CM | POA: Insufficient documentation

## 2021-07-20 DIAGNOSIS — E559 Vitamin D deficiency, unspecified: Secondary | ICD-10-CM | POA: Diagnosis not present

## 2021-07-20 DIAGNOSIS — Z23 Encounter for immunization: Secondary | ICD-10-CM | POA: Diagnosis not present

## 2021-07-20 DIAGNOSIS — Z1211 Encounter for screening for malignant neoplasm of colon: Secondary | ICD-10-CM

## 2021-07-20 DIAGNOSIS — Z1231 Encounter for screening mammogram for malignant neoplasm of breast: Secondary | ICD-10-CM

## 2021-07-20 MED ORDER — DEXMETHYLPHENIDATE HCL ER 35 MG PO CP24: 30.0000 mg | ORAL_CAPSULE | Freq: Every day | ORAL | 0 refills | Status: DC

## 2021-07-20 MED ORDER — ERGOCALCIFEROL 1.25 MG (50000 UT) PO CAPS: 50000.0000 [IU] | ORAL_CAPSULE | ORAL | 5 refills | Status: DC

## 2021-07-20 NOTE — Progress Notes (Signed)
Established patient visit ? ? ?Patient: Kristen Hanna   DOB: [birth year]   46 y.o. Female  MRN: [id number] ?Visit Date: 07/20/2021 ? ? ?Chief Complaint  ?Patient presents with  ? Annual Exam  ? Gynecologic Exam  ? ?Subjective  ?  ?HPI  ?The patient is here for annual wellness visit ?-reviewed labs. Severe vitamin d deficiency.  ?-due to have mammogram  ?-due to have screening for colon cancer.  ?-takes focalin ER daily for focus and concentration. Takes a 49m dose in afternoons if needed. Doing well.  ?-no new concerns or complaints.  ? ? ?Medications: ?Outpatient Medications Prior to Visit  ?Medication Sig  ? dexmethylphenidate (FOCALIN) 5 MG tablet Take 1 tablet (5 mg total) by mouth 2 (two) times daily.  ? [DISCONTINUED] Dexmethylphenidate HCl 35 MG CP24 Take 30 mg by mouth daily.  ? ?No facility-administered medications prior to visit.  ? ? ?Review of Systems  ?Constitutional:  Negative for activity change, appetite change, chills, fatigue and fever.  ?HENT:  Negative for congestion, postnasal drip, rhinorrhea, sinus pressure, sinus pain, sneezing and sore throat.   ?Eyes: Negative.   ?Respiratory:  Negative for cough, chest tightness, shortness of breath and wheezing.   ?Cardiovascular:  Negative for chest pain and palpitations.  ?Gastrointestinal:  Negative for abdominal pain, constipation, diarrhea, nausea and vomiting.  ?Endocrine: Negative for cold intolerance, heat intolerance, polydipsia and polyuria.  ?Genitourinary:  Negative for dyspareunia, dysuria, flank pain, frequency and urgency.  ?Musculoskeletal:  Negative for arthralgias, back pain and myalgias.  ?Skin:  Negative for rash.  ?Allergic/Immunologic: Negative for environmental allergies.  ?Neurological:  Negative for dizziness, weakness and headaches.  ?Hematological:  Negative for adenopathy.  ?Psychiatric/Behavioral:  Positive for decreased concentration. The patient is not nervous/anxious.   ? ?Last CBC ?Lab Results  ?Component Value Date  ? WBC  5.1 07/09/2021  ? HGB 12.1 07/09/2021  ? HCT 36.0 07/09/2021  ? MCV 97 07/09/2021  ? MCH 32.4 07/09/2021  ? RDW 12.4 07/09/2021  ? PLT 241 07/09/2021  ? ?Last metabolic panel ?Lab Results  ?Component Value Date  ? GLUCOSE 98 07/09/2021  ? NA 137 07/09/2021  ? K 4.0 07/09/2021  ? CL 99 07/09/2021  ? CO2 23 07/09/2021  ? BUN 12 07/09/2021  ? CREATININE 0.71 07/09/2021  ? EGFR 107 07/09/2021  ? CALCIUM 8.8 07/09/2021  ? PROT 7.0 07/09/2021  ? ALBUMIN 4.2 07/09/2021  ? LABGLOB 2.8 07/09/2021  ? AGRATIO 1.5 07/09/2021  ? BILITOT 0.4 07/09/2021  ? ALKPHOS 61 07/09/2021  ? AST 22 07/09/2021  ? ALT 16 07/09/2021  ? ?Last lipids ?Lab Results  ?Component Value Date  ? CHOL 194 07/09/2021  ? HDL 106 07/09/2021  ? [id number] 07/09/2021  ? TRIG 63 07/09/2021  ? CHOLHDL 1.8 07/09/2021  ? ?Last hemoglobin A1c ?Lab Results  ?Component Value Date  ? HGBA1C 4.7 (L) 07/09/2021  ? ?Last thyroid functions ?Lab Results  ?Component Value Date  ? TSH 3.990 07/09/2021  ? ?Last vitamin D ?Lab Results  ?Component Value Date  ? VD25OH 5.7 (L) 07/09/2021  ? ?  ? ? Objective  ?  ? ?Today's Vitals  ? 07/20/21 1005  ?BP: 117/75  ?Pulse: 78  ?Temp: 98 ?F (36.7 ?C)  ?SpO2: 100%  ?Weight: 144 lb 4.8 oz (65.5 kg)  ?Height: 5' 4.96" (1.65 m)  ? ?Body mass index is 24.04 kg/m?.  ? ?BP Readings from Last 3 Encounters:  ?07/20/21 117/75  ?06/18/21 102/69  ?03/03/21 133/88  ?  ?  Wt Readings from Last 3 Encounters:  ?07/20/21 144 lb 4.8 oz (65.5 kg)  ?06/18/21 138 lb 8 oz (62.8 kg)  ?03/03/21 138 lb (62.6 kg)  ?  ?Physical Exam ?Vitals and nursing note reviewed. Exam conducted with a chaperone present.  ?Constitutional:   ?   Appearance: Normal appearance. She is well-developed.  ?HENT:  ?   Head: Normocephalic and atraumatic.  ?   Right Ear: Tympanic membrane, ear canal and external ear normal.  ?   Left Ear: Tympanic membrane, ear canal and external ear normal.  ?   Nose: Nose normal.  ?   Mouth/Throat:  ?   Mouth: Mucous membranes are moist.  ?   Pharynx:  Oropharynx is clear.  ?Eyes:  ?   Extraocular Movements: Extraocular movements intact.  ?   Conjunctiva/sclera: Conjunctivae normal.  ?   Pupils: Pupils are equal, round, and reactive to light.  ?Cardiovascular:  ?   Rate and Rhythm: Normal rate and regular rhythm.  ?   Pulses: Normal pulses.  ?   Heart sounds: Normal heart sounds.  ?Pulmonary:  ?   Effort: Pulmonary effort is normal.  ?   Breath sounds: Normal breath sounds.  ?Chest:  ?Breasts: ?   Right: Normal. No swelling, bleeding, inverted nipple, mass, nipple discharge, skin change or tenderness.  ?   Left: Normal. No swelling, bleeding, inverted nipple, mass, nipple discharge, skin change or tenderness.  ?Abdominal:  ?   General: Bowel sounds are normal. There is no distension.  ?   Palpations: Abdomen is soft. There is no mass.  ?   Tenderness: There is no abdominal tenderness. There is no right CVA tenderness, left CVA tenderness, guarding or rebound.  ?   Hernia: No hernia is present. There is no hernia in the left inguinal area or right inguinal area.  ?Genitourinary: ?   General: Normal vulva.  ?   Exam position: Supine.  ?   Labia:     ?   Right: No rash, tenderness, lesion or injury.     ?   Left: No rash, tenderness, lesion or injury.   ?   Vagina: Normal. No signs of injury and foreign body. No vaginal discharge, erythema, tenderness, bleeding or lesions.  ?   Cervix: No cervical motion tenderness, discharge, friability, lesion, erythema or cervical bleeding.  ?   Adnexa: Right adnexa normal and left adnexa normal.  ?   Comments: No tenderness, masses, or organomeglay present during bimanual exam . ?  ?Musculoskeletal:     ?   General: Normal range of motion.  ?   Cervical back: Normal range of motion and neck supple.  ?Lymphadenopathy:  ?   Cervical: No cervical adenopathy.  ?   Upper Body:  ?   Right upper body: No axillary adenopathy.  ?   Left upper body: No axillary adenopathy.  ?Skin: ?   General: Skin is warm and dry.  ?   Capillary Refill:  Capillary refill takes less than 2 seconds.  ?Neurological:  ?   General: No focal deficit present.  ?   Mental Status: She is alert and oriented to person, place, and time.  ?Psychiatric:     ?   Mood and Affect: Mood normal.     ?   Behavior: Behavior normal.     ?   Thought Content: Thought content normal.     ?   Judgment: Judgment normal.  ?  ? ? ?Results for orders placed  or performed in visit on 07/20/21  ?Cytology - PAP( [facility])  ?Result Value Ref Range  ? High risk HPV Negative   ? Adequacy    ?  Satisfactory for evaluation; transformation zone component ABSENT.  ? Diagnosis    ?  - Negative for intraepithelial lesion or malignancy (NILM)  ? Comment Normal Reference Range HPV - Negative   ? ? Assessment & Plan  ?  ?1. Well woman exam ?Annual wellness visit today.  Pap smear obtained during today's visit. ?- Cytology - PAP( [place]) ? ?2. Vitamin D deficiency ?Patient with significant vitamin D deficiency.  Start Drisdol 50,000 IUs weekly.  We will continue this treatment for 6 months.  After, we will have her take OTC vitamin D 5000 international units daily. ?- ergocalciferol (DRISDOL) 1.25 MG (50000 UT) capsule; Take 1 capsule (50,000 Units total) by mouth once a week.  Dispense: 4 capsule; Refill: 5 ? ?3. Attention deficit disorder (ADD) without hyperactivity ?We will continue Focalin Exar 35 mg daily.  Take daily.  Three 30-day prescriptions were sent to her pharmacy.  Dates are 07/20/2021, 08/17/2021, and 09/15/2021. ?- Dexmethylphenidate HCl 35 MG CP24; Take 30 mg by mouth daily.  Dispense: 30 capsule; Refill: 0 ? ?4. Need for Tdap vaccination ?Tdap vaccine administered during today's visit.   ?- Tdap vaccine greater than or equal to 7yo IM ? ?5. Encounter for screening mammogram for malignant neoplasm of breast ?Screening mammogram was ordered during today's visit. ?- MM DIGITAL SCREENING BILATERAL; Future ? ?6. Screening for colon cancer ?Order for [facility]olog[facility]. ?-  Cologuard  ? ? ?Problem List Items Addressed This Visit   ? ?  ? Other  ? Attention deficit disorder (ADD) without hyperactivity  ? Relevant Medications  ? Dexmethylphenidate HCl 35 MG CP24  ? Vitami

## 2021-07-22 ENCOUNTER — Encounter: Payer: Self-pay | Admitting: Nurse Practitioner

## 2021-07-22 LAB — CYTOLOGY - PAP
Adequacy: ABSENT
Comment: NEGATIVE
Diagnosis: NEGATIVE
High risk HPV: NEGATIVE

## 2021-07-26 DIAGNOSIS — E559 Vitamin D deficiency, unspecified: Secondary | ICD-10-CM | POA: Insufficient documentation

## 2021-08-03 ENCOUNTER — Ambulatory Visit
Admission: RE | Admit: 2021-08-03 | Discharge: 2021-08-03 | Disposition: A | Discharge status: Home / Self Care | Payer: Managed Care, Other (non HMO) | Source: Ambulatory Visit | Attending: Nurse Practitioner | Admitting: Nurse Practitioner

## 2021-08-03 DIAGNOSIS — Z1231 Encounter for screening mammogram for malignant neoplasm of breast: Secondary | ICD-10-CM

## 2021-08-05 ENCOUNTER — Other Ambulatory Visit: Payer: Self-pay | Admitting: Nurse Practitioner

## 2021-08-05 DIAGNOSIS — R928 Other abnormal and inconclusive findings on diagnostic imaging of breast: Secondary | ICD-10-CM

## 2021-08-05 NOTE — Progress Notes (Signed)
Additional imaging requested for her mammogram .

## 2021-08-24 ENCOUNTER — Other Ambulatory Visit: Payer: Self-pay | Admitting: Nurse Practitioner

## 2021-08-24 DIAGNOSIS — F988 Other specified behavioral and emotional disorders with onset usually occurring in childhood and adolescence: Secondary | ICD-10-CM

## 2021-08-24 MED ORDER — DEXMETHYLPHENIDATE HCL 5 MG PO TABS: 5.0000 mg | ORAL_TABLET | Freq: Two times a day (BID) | ORAL | 0 refills | Status: DC

## 2021-08-26 ENCOUNTER — Other Ambulatory Visit: Payer: Self-pay | Admitting: Nurse Practitioner

## 2021-08-26 ENCOUNTER — Ambulatory Visit
Admission: RE | Admit: 2021-08-26 | Discharge: 2021-08-26 | Disposition: A | Discharge status: Home / Self Care | Payer: Managed Care, Other (non HMO) | Source: Ambulatory Visit | Attending: Nurse Practitioner | Admitting: Nurse Practitioner

## 2021-08-26 ENCOUNTER — Encounter: Payer: Self-pay | Admitting: Nurse Practitioner

## 2021-08-26 DIAGNOSIS — R928 Other abnormal and inconclusive findings on diagnostic imaging of breast: Secondary | ICD-10-CM

## 2021-08-26 DIAGNOSIS — N6310 Unspecified lump in the right breast, unspecified quadrant: Secondary | ICD-10-CM

## 2021-08-26 LAB — COLOGUARD: COLOGUARD: NEGATIVE

## 2021-09-02 NOTE — Progress Notes (Signed)
Benign cyst or fibroadenoma. Close follow up recommended

## 2021-09-02 NOTE — Progress Notes (Signed)
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 Close follow up recommended.

## 2021-10-09 ENCOUNTER — Other Ambulatory Visit: Payer: Self-pay | Admitting: Nurse Practitioner

## 2021-10-09 ENCOUNTER — Encounter: Payer: Self-pay | Admitting: Nurse Practitioner

## 2021-10-09 DIAGNOSIS — F988 Other specified behavioral and emotional disorders with onset usually occurring in childhood and adolescence: Secondary | ICD-10-CM

## 2021-10-12 ENCOUNTER — Other Ambulatory Visit: Payer: Self-pay | Admitting: Nurse Practitioner

## 2021-10-12 ENCOUNTER — Telehealth: Payer: Self-pay | Admitting: Nurse Practitioner

## 2021-10-12 DIAGNOSIS — F988 Other specified behavioral and emotional disorders with onset usually occurring in childhood and adolescence: Secondary | ICD-10-CM

## 2021-10-12 MED ORDER — DEXMETHYLPHENIDATE HCL ER 35 MG PO CP24: 30.0000 mg | ORAL_CAPSULE | Freq: Every day | ORAL | 0 refills | Status: DC

## 2021-10-12 MED ORDER — DEXMETHYLPHENIDATE HCL 5 MG PO TABS: 5.0000 mg | ORAL_TABLET | Freq: Two times a day (BID) | ORAL | 0 refills | Status: DC

## 2021-10-18 NOTE — Progress Notes (Signed)
Established patient visit   Patient: Kristen Hanna   DOB: 26-Sep-1975   46 y.o. Female  MRN: [id number] Visit Date: 10/19/2021   Chief Complaint  Patient presents with   Medication Refill   Subjective    HPI  Follow up visit -adhd --currently takes focalin XR 35 mg daily.  -does have focalin 5 mg tablets she takes if needed for late night meetings and extra long work hours.  -PDMP reviewed 10/12/2021 - no red flags or state indicators. -she reports doing well on current doses of medications and has no questions or concerns.    Medications: Outpatient Medications Prior to Visit  Medication Sig   ergocalciferol (DRISDOL) 1.25 MG (50000 UT) capsule Take 1 capsule (50,000 Units total) by mouth once a week.   [DISCONTINUED] dexmethylphenidate (FOCALIN) 5 MG tablet Take 1 tablet (5 mg total) by mouth 2 (two) times daily.   [DISCONTINUED] Dexmethylphenidate HCl 35 MG CP24 Take 30 mg by mouth daily.   No facility-administered medications prior to visit.    Review of Systems  Constitutional:  Negative for activity change, appetite change, chills, fatigue and fever.  HENT:  Negative for congestion, postnasal drip, rhinorrhea, sinus pressure, sinus pain, sneezing and sore throat.   Eyes: Negative.   Respiratory:  Negative for cough, chest tightness, shortness of breath and wheezing.   Cardiovascular:  Negative for chest pain and palpitations.  Gastrointestinal:  Negative for abdominal pain, constipation, diarrhea, nausea and vomiting.  Endocrine: Negative for cold intolerance, heat intolerance, polydipsia and polyuria.  Genitourinary:  Negative for dyspareunia, dysuria, flank pain, frequency and urgency.  Musculoskeletal:  Negative for arthralgias, back pain and myalgias.  Skin:  Negative for rash.  Allergic/Immunologic: Negative for environmental allergies.  Neurological:  Negative for dizziness, weakness and headaches.  Hematological:  Negative for adenopathy.   Psychiatric/Behavioral:  The patient is not nervous/anxious.        Objective     Today's Vitals   10/19/21 1510  BP: 120/81  Pulse: (!) 117  Temp: 98.2 F (36.8 C)  TempSrc: Temporal  SpO2: 97%  Weight: 152 lb (68.9 kg)   Body mass index is 25.32 kg/m.   BP Readings from Last 3 Encounters:  10/19/21 120/81  07/20/21 117/75  06/18/21 102/69    Wt Readings from Last 3 Encounters:  10/19/21 152 lb (68.9 kg)  07/20/21 144 lb 4.8 oz (65.5 kg)  06/18/21 138 lb 8 oz (62.8 kg)    Physical Exam Vitals and nursing note reviewed.  Constitutional:      Appearance: Normal appearance. She is well-developed.  HENT:     Head: Normocephalic and atraumatic.  Eyes:     Pupils: Pupils are equal, round, and reactive to light.  Cardiovascular:     Rate and Rhythm: Normal rate and regular rhythm.     Pulses: Normal pulses.     Heart sounds: Normal heart sounds.     Comments: Recheck pulse during exam 98 Pulmonary:     Effort: Pulmonary effort is normal.     Breath sounds: Normal breath sounds.  Abdominal:     Palpations: Abdomen is soft.  Musculoskeletal:        General: Normal range of motion.     Cervical back: Normal range of motion and neck supple.  Lymphadenopathy:     Cervical: No cervical adenopathy.  Skin:    General: Skin is warm and dry.     Capillary Refill: Capillary refill takes less than 2 seconds.  Neurological:  General: No focal deficit present.     Mental Status: She is alert and oriented to person, place, and time.  Psychiatric:        Mood and Affect: Mood normal.        Behavior: Behavior normal.        Thought Content: Thought content normal.        Judgment: Judgment normal.       Assessment & Plan    1. Attention deficit disorder (ADD) without hyperactivity Patient doing well with current regimen of Focalin. She may continue 35 mg XR every morning and 5 mg IR up to twice daily as needed. Three thirty day prescriptions were sent to her  pharmacy. Dates are  10/19/2021, 11/17/2021, and 12/16/2021.  - Dexmethylphenidate HCl 35 MG CP24; Take 35 mg by mouth daily.  Dispense: 30 capsule; Refill: 0 - dexmethylphenidate (FOCALIN) 5 MG tablet; Take 1 tablet (5 mg total) by mouth 2 (two) times daily.  Dispense: 60 tablet; Refill: 0  Problem List Items Addressed This Visit       Other   Attention deficit disorder (ADD) without hyperactivity   Relevant Medications   Dexmethylphenidate HCl 35 MG CP24   dexmethylphenidate (FOCALIN) 5 MG tablet     Return in about 3 months (around 01/19/2022) for ADD - will need to sign controlled substances agreement at next visit .         Carlean Jews, NP  [facility] at [facility] [phone] (phone) [phone] (fax)  [facility]

## 2021-10-19 ENCOUNTER — Ambulatory Visit: Payer: 59 | Admitting: Nurse Practitioner

## 2021-10-19 ENCOUNTER — Encounter: Payer: Self-pay | Admitting: Nurse Practitioner

## 2021-10-19 DIAGNOSIS — F988 Other specified behavioral and emotional disorders with onset usually occurring in childhood and adolescence: Secondary | ICD-10-CM | POA: Diagnosis not present

## 2021-10-19 MED ORDER — DEXMETHYLPHENIDATE HCL 5 MG PO TABS: 5.0000 mg | ORAL_TABLET | Freq: Two times a day (BID) | ORAL | 0 refills | Status: DC

## 2021-10-19 MED ORDER — DEXMETHYLPHENIDATE HCL ER 35 MG PO CP24: 35.0000 mg | ORAL_CAPSULE | Freq: Every day | ORAL | 0 refills | Status: DC

## 2021-10-20 NOTE — Telephone Encounter (Signed)
close

## 2021-10-26 MED ORDER — DEXMETHYLPHENIDATE HCL ER 35 MG PO CP24: 35.0000 mg | ORAL_CAPSULE | Freq: Every day | ORAL | 0 refills | Status: DC

## 2021-10-26 MED ORDER — DEXMETHYLPHENIDATE HCL 5 MG PO TABS: 5.0000 mg | ORAL_TABLET | Freq: Two times a day (BID) | ORAL | 0 refills | Status: DC

## 2021-11-14 ENCOUNTER — Other Ambulatory Visit: Payer: Self-pay | Admitting: Nurse Practitioner

## 2021-11-14 DIAGNOSIS — F988 Other specified behavioral and emotional disorders with onset usually occurring in childhood and adolescence: Secondary | ICD-10-CM

## 2021-11-17 ENCOUNTER — Other Ambulatory Visit: Payer: Self-pay | Admitting: Nurse Practitioner

## 2021-11-17 DIAGNOSIS — F988 Other specified behavioral and emotional disorders with onset usually occurring in childhood and adolescence: Secondary | ICD-10-CM

## 2021-11-17 MED ORDER — DEXMETHYLPHENIDATE HCL ER 35 MG PO CP24: 35.0000 mg | ORAL_CAPSULE | Freq: Every day | ORAL | 0 refills | Status: DC

## 2021-11-17 NOTE — Progress Notes (Signed)
Sent new prescription for focalin XR 35 mg to be filled now. Advised pharmacy to keep prescription dated 12/13/2021 to be kept on file until then.

## 2021-11-17 NOTE — Telephone Encounter (Signed)
Please let the patient know that I sent new prescription for focalin XR 35 mg to be filled now. Advised pharmacy to keep prescription dated 12/13/2021 to be kept on file until then.  Thanks so much.   -HB

## 2021-11-18 NOTE — Telephone Encounter (Signed)
Called pt she is advised of the Rx that was sent to pharmacy and recommendation

## 2022-01-19 ENCOUNTER — Encounter: Payer: Self-pay | Admitting: Nurse Practitioner

## 2022-01-19 ENCOUNTER — Ambulatory Visit (INDEPENDENT_AMBULATORY_CARE_PROVIDER_SITE_OTHER): Payer: 59 | Admitting: Nurse Practitioner

## 2022-01-19 VITALS — BP 134/86 | HR 86 | Ht 64.96 in | Wt 167.4 lb

## 2022-01-19 DIAGNOSIS — F988 Other specified behavioral and emotional disorders with onset usually occurring in childhood and adolescence: Secondary | ICD-10-CM

## 2022-01-19 MED ORDER — AMPHETAMINE-DEXTROAMPHETAMINE 10 MG PO TABS: 5.0000 mg | ORAL_TABLET | Freq: Every day | ORAL | 0 refills | Status: DC | PRN

## 2022-01-19 MED ORDER — LISDEXAMFETAMINE DIMESYLATE 30 MG PO CAPS: 30.0000 mg | ORAL_CAPSULE | Freq: Every day | ORAL | 0 refills | Status: DC

## 2022-01-19 NOTE — Progress Notes (Signed)
Established patient visit   Patient: Kristen Hanna   DOB: 25-Feb-1976   46 y.o. Female  MRN: [id number] Visit Date: 01/19/2022   Chief Complaint  Patient presents with   Follow-up   Subjective    HPI  Currently on Focalin XR 35 mg daily. Then 5 in PM if needed  -states that she does get some brain fog with this. Does not work as well as Vyvanse did.  --had to change due to insurance. Now has different insurance and would like to change back to Vyvanse and see if covered.  Reviewed PDMP history 01/19/2022 -  --overdose risk score is 000 with no red flags or state indicators    Medications: Outpatient Medications Prior to Visit  Medication Sig   ergocalciferol (DRISDOL) 1.25 MG (50000 UT) capsule Take 1 capsule (50,000 Units total) by mouth once a week.   [DISCONTINUED] dexmethylphenidate (FOCALIN) 5 MG tablet Take 1 tablet (5 mg total) by mouth 2 (two) times daily.   [DISCONTINUED] Dexmethylphenidate HCl 35 MG CP24 Take 35 mg by mouth daily.   No facility-administered medications prior to visit.    Review of Systems  Constitutional:  Positive for fatigue. Negative for activity change, appetite change, chills and fever.  HENT:  Negative for congestion, postnasal drip, rhinorrhea, sinus pressure, sinus pain, sneezing and sore throat.   Eyes: Negative.   Respiratory:  Negative for cough, chest tightness, shortness of breath and wheezing.   Cardiovascular:  Negative for chest pain and palpitations.  Gastrointestinal:  Negative for abdominal pain, constipation, diarrhea, nausea and vomiting.  Endocrine: Negative for cold intolerance, heat intolerance, polydipsia and polyuria.  Genitourinary:  Negative for dyspareunia, dysuria, flank pain, frequency and urgency.  Musculoskeletal:  Negative for arthralgias, back pain and myalgias.  Skin:  Negative for rash.  Allergic/Immunologic: Negative for environmental allergies.  Neurological:  Negative for dizziness, weakness and headaches.   Hematological:  Negative for adenopathy.  Psychiatric/Behavioral:  Positive for decreased concentration. The patient is not nervous/anxious.        Objective     Today's Vitals   01/19/22 0905  BP: 134/86  Pulse: 86  SpO2: 99%  Weight: 167 lb 6.4 oz (75.9 kg)  Height: 5' 4.96" (1.65 m)   Body mass index is 27.89 kg/m.  BP Readings from Last 3 Encounters:  01/19/22 134/86  10/19/21 120/81  07/20/21 117/75    Wt Readings from Last 3 Encounters:  01/19/22 167 lb 6.4 oz (75.9 kg)  10/19/21 152 lb (68.9 kg)  07/20/21 144 lb 4.8 oz (65.5 kg)    Physical Exam Vitals and nursing note reviewed.  Constitutional:      Appearance: Normal appearance. She is well-developed.  HENT:     Head: Normocephalic and atraumatic.     Nose: Nose normal.     Mouth/Throat:     Mouth: Mucous membranes are moist.     Pharynx: Oropharynx is clear.  Eyes:     Extraocular Movements: Extraocular movements intact.     Conjunctiva/sclera: Conjunctivae normal.     Pupils: Pupils are equal, round, and reactive to light.  Cardiovascular:     Rate and Rhythm: Normal rate and regular rhythm.     Pulses: Normal pulses.     Heart sounds: Normal heart sounds.  Pulmonary:     Effort: Pulmonary effort is normal.     Breath sounds: Normal breath sounds.  Abdominal:     Palpations: Abdomen is soft.  Musculoskeletal:  General: Normal range of motion.     Cervical back: Normal range of motion and neck supple.  Lymphadenopathy:     Cervical: No cervical adenopathy.  Skin:    General: Skin is warm and dry.     Capillary Refill: Capillary refill takes less than 2 seconds.  Neurological:     General: No focal deficit present.     Mental Status: She is alert and oriented to person, place, and time.  Psychiatric:        Mood and Affect: Mood normal.        Behavior: Behavior normal.        Thought Content: Thought content normal.        Judgment: Judgment normal.       Assessment & Plan     1. Attention deficit disorder (ADD) without hyperactivity D/c focalin, both extended and immediate release formations. Trial Vyvanse 30 mg daily as needed. Add adderall 10 mg, taking 1/2 to 1 tablet in the afternoons if needed. Three 30 day prescriptions were sent for Vyvanse. Reassess in three months.  - amphetamine-dextroamphetamine (ADDERALL) 10 MG tablet; Take 0.5-1 tablets (5-10 mg total) by mouth daily as needed.  Dispense: 90 tablet; Refill: 0 - lisdexamfetamine (VYVANSE) 30 MG capsule; Take 1 capsule (30 mg total) by mouth daily.  Dispense: 30 capsule; Refill: 0    Problem List Items Addressed This Visit       Other   Attention deficit disorder (ADD) without hyperactivity - Primary   Relevant Medications   amphetamine-dextroamphetamine (ADDERALL) 10 MG tablet   lisdexamfetamine (VYVANSE) 30 MG capsule     Return in about 3 months (around 04/21/2022) for ADD.         Ronnell Freshwater, NP  [facility] at [facility] [phone] (phone) [phone] (fax)  [place]

## 2022-02-17 ENCOUNTER — Other Ambulatory Visit: Payer: Self-pay | Admitting: Nurse Practitioner

## 2022-02-17 DIAGNOSIS — F988 Other specified behavioral and emotional disorders with onset usually occurring in childhood and adolescence: Secondary | ICD-10-CM

## 2022-02-17 MED ORDER — LISDEXAMFETAMINE DIMESYLATE 40 MG PO CAPS: 40.0000 mg | ORAL_CAPSULE | Freq: Every day | ORAL | 0 refills | Status: DC

## 2022-03-12 ENCOUNTER — Ambulatory Visit
Admission: RE | Admit: 2022-03-12 | Discharge: 2022-03-12 | Disposition: A | Discharge status: Home / Self Care | Payer: 59 | Source: Ambulatory Visit | Attending: Nurse Practitioner | Admitting: Nurse Practitioner

## 2022-03-12 ENCOUNTER — Other Ambulatory Visit: Payer: Self-pay | Admitting: Nurse Practitioner

## 2022-03-12 DIAGNOSIS — N6310 Unspecified lump in the right breast, unspecified quadrant: Secondary | ICD-10-CM

## 2022-03-14 NOTE — Progress Notes (Signed)
Likely benign findings. Repeat u/s in 6 months

## 2022-04-20 NOTE — Progress Notes (Signed)
Established patient visit   Patient: Kristen Hanna   DOB: 1975-05-13   47 y.o. Female  MRN: [id number] Visit Date: 04/21/2022   Chief Complaint  Patient presents with   Follow-up   Subjective    HPI  Follow up  -ADD  --changed focalin XR to Vyvanse 40 mg daily and adderall 10 mg in afternoon if needed  -she states that she is doing well with current medication dosages and needs refills today  -She denies chest pain, chest pressure, or shortness of breath. She denies headaches or visual disturbances. She denies abdominal pain, nausea, vomiting, or changes in bowel or bladder habits.    Medications: Outpatient Medications Prior to Visit  Medication Sig   [DISCONTINUED] amphetamine-dextroamphetamine (ADDERALL) 10 MG tablet Take 0.5-1 tablets (5-10 mg total) by mouth daily as needed.   [DISCONTINUED] ergocalciferol (DRISDOL) 1.25 MG (50000 UT) capsule Take 1 capsule (50,000 Units total) by mouth once a week.   [DISCONTINUED] lisdexamfetamine (VYVANSE) 40 MG capsule Take 1 capsule (40 mg total) by mouth daily.   No facility-administered medications prior to visit.    Review of Systems See HPi  Last CBC Lab Results  Component Value Date   WBC 5.1 07/09/2021   HGB 12.1 07/09/2021   HCT 36.0 07/09/2021   MCV 97 07/09/2021   MCH 32.4 07/09/2021   RDW 12.4 07/09/2021   PLT 241 62/37/6283   Last metabolic panel Lab Results  Component Value Date   GLUCOSE 98 07/09/2021   NA 137 07/09/2021   K 4.0 07/09/2021   CL 99 07/09/2021   CO2 23 07/09/2021   BUN 12 07/09/2021   CREATININE 0.71 07/09/2021   EGFR 107 07/09/2021   CALCIUM 8.8 07/09/2021   PROT 7.0 07/09/2021   ALBUMIN 4.2 07/09/2021   LABGLOB 2.8 07/09/2021   AGRATIO 1.5 07/09/2021   BILITOT 0.4 07/09/2021   ALKPHOS 61 07/09/2021   AST 22 07/09/2021   ALT 16 07/09/2021   Last lipids Lab Results  Component Value Date   CHOL 194 07/09/2021   HDL 106 07/09/2021   LDLCALC 76 07/09/2021   TRIG 63 07/09/2021    CHOLHDL 1.8 07/09/2021   Last hemoglobin A1c Lab Results  Component Value Date   HGBA1C 4.7 (L) 07/09/2021   Last thyroid functions Lab Results  Component Value Date   TSH 3.990 07/09/2021   Last vitamin D Lab Results  Component Value Date   VD25OH 5.7 (L) 07/09/2021       Objective     Today's Vitals   04/21/22 0856  BP: 119/81  Pulse: 97  SpO2: 99%  Weight: 163 lb 6.4 oz (74.1 kg)  Height: 5' 4.96" (1.65 m)   Body mass index is 27.22 kg/m.  BP Readings from Last 3 Encounters:  04/21/22 119/81  01/19/22 134/86  10/19/21 120/81    Wt Readings from Last 3 Encounters:  04/21/22 163 lb 6.4 oz (74.1 kg)  01/19/22 167 lb 6.4 oz (75.9 kg)  10/19/21 152 lb (68.9 kg)    Physical Exam Vitals and nursing note reviewed.  Constitutional:      Appearance: Normal appearance. She is well-developed.  HENT:     Head: Normocephalic and atraumatic.  Eyes:     Pupils: Pupils are equal, round, and reactive to light.  Cardiovascular:     Rate and Rhythm: Normal rate and regular rhythm.     Pulses: Normal pulses.     Heart sounds: Normal heart sounds.  Pulmonary:  Effort: Pulmonary effort is normal.     Breath sounds: Normal breath sounds.  Abdominal:     Palpations: Abdomen is soft.  Musculoskeletal:        General: Normal range of motion.     Cervical back: Normal range of motion and neck supple.  Lymphadenopathy:     Cervical: No cervical adenopathy.  Skin:    General: Skin is warm and dry.     Capillary Refill: Capillary refill takes less than 2 seconds.  Neurological:     General: No focal deficit present.     Mental Status: She is alert and oriented to person, place, and time.  Psychiatric:        Mood and Affect: Mood normal.        Behavior: Behavior normal.        Thought Content: Thought content normal.        Judgment: Judgment normal.       Assessment & Plan    1. Vitamin D deficiency added Drisdol 03013 iu weekly, which is high dose  vitamin d. She will take this weekly for next few months.  - ergocalciferol (DRISDOL) 1.25 MG (50000 UT) capsule; Take 1 capsule (50,000 Units total) by mouth once a week.  Dispense: 4 capsule; Refill: 5  2. Attention deficit disorder (ADD) without hyperactivity Doing well with Vyvanse daily and adderall 10 mg in afternoons if needed. Three 30 day prescriptions sent to her pharmacy. Dates are  04/21/2022, 05/20/2022, and 06/17/2022.  - amphetamine-dextroamphetamine (ADDERALL) 10 MG tablet; Take 0.5-1 tablets (5-10 mg total) by mouth daily as needed.  Dispense: 30 tablet; Refill: 0 - lisdexamfetamine (VYVANSE) 40 MG capsule; Take 1 capsule (40 mg total) by mouth daily.  Dispense: 30 capsule; Refill: 0   Problem List Items Addressed This Visit       Other   Attention deficit disorder (ADD) without hyperactivity   Relevant Medications   amphetamine-dextroamphetamine (ADDERALL) 10 MG tablet   lisdexamfetamine (VYVANSE) 40 MG capsule   Vitamin D deficiency - Primary   Relevant Medications   ergocalciferol (DRISDOL) 1.25 MG (50000 UT) capsule     Return in about 3 months (around 07/21/2022) for health maintenance exam, FBW a week prior to visit.         Carlean Jews, NP  [facility] at [facility] [phone] (phone) [phone] (fax)  [facility]

## 2022-04-21 ENCOUNTER — Ambulatory Visit: Payer: 59 | Admitting: Nurse Practitioner

## 2022-04-21 ENCOUNTER — Encounter: Payer: Self-pay | Admitting: Nurse Practitioner

## 2022-04-21 VITALS — BP 119/81 | HR 97 | Ht 64.96 in | Wt 163.4 lb

## 2022-04-21 DIAGNOSIS — F988 Other specified behavioral and emotional disorders with onset usually occurring in childhood and adolescence: Secondary | ICD-10-CM

## 2022-04-21 DIAGNOSIS — E559 Vitamin D deficiency, unspecified: Secondary | ICD-10-CM | POA: Diagnosis not present

## 2022-04-21 MED ORDER — AMPHETAMINE-DEXTROAMPHETAMINE 10 MG PO TABS: 5.0000 mg | ORAL_TABLET | Freq: Every day | ORAL | 0 refills | Status: DC | PRN

## 2022-04-21 MED ORDER — LISDEXAMFETAMINE DIMESYLATE 40 MG PO CAPS: 40.0000 mg | ORAL_CAPSULE | Freq: Every day | ORAL | 0 refills | Status: DC

## 2022-04-21 MED ORDER — ERGOCALCIFEROL 1.25 MG (50000 UT) PO CAPS: 50000.0000 [IU] | ORAL_CAPSULE | ORAL | 5 refills | Status: DC

## 2022-06-22 ENCOUNTER — Telehealth: Payer: Self-pay | Admitting: *Deleted

## 2022-06-22 NOTE — Telephone Encounter (Signed)
LVM to call office back to inform patient that I will need to push her appointment to the 9:10am slot on 10/01/22 due to provider having a meeting that morning.  Please assist in getting this rescheduled if she calls back.  Will also send a mychart message as well.

## 2022-07-21 ENCOUNTER — Ambulatory Visit: Payer: 59 | Admitting: Nurse Practitioner

## 2022-07-21 ENCOUNTER — Encounter: Payer: Self-pay | Admitting: Nurse Practitioner

## 2022-07-21 DIAGNOSIS — F988 Other specified behavioral and emotional disorders with onset usually occurring in childhood and adolescence: Secondary | ICD-10-CM | POA: Diagnosis not present

## 2022-07-21 MED ORDER — LISDEXAMFETAMINE DIMESYLATE 40 MG PO CAPS: 40.0000 mg | ORAL_CAPSULE | Freq: Every day | ORAL | 0 refills | Status: DC

## 2022-07-21 MED ORDER — AMPHETAMINE-DEXTROAMPHETAMINE 10 MG PO TABS: 5.0000 mg | ORAL_TABLET | Freq: Every day | ORAL | 0 refills | Status: DC | PRN

## 2022-07-21 NOTE — Progress Notes (Signed)
Established patient visit   Patient: Kristen Hanna   DOB: 12-26-1975   47 y.o. Female  MRN: [id number] Visit Date: 07/21/2022   Chief Complaint  Patient presents with   Medical Management of Chronic Issues   Subjective    HPI  Follow up  Concentration deficit  -Vyvanse 40 mg daily and adderall 10 mg in afternoon if needed  -she states that she is doing well with current medication dosages  -She denies chest pain, chest pressure, or shortness of breath. She denies headaches or visual disturbances. She denies abdominal pain, nausea, vomiting, or changes in bowel or bladder habits.       Medications: Outpatient Medications Prior to Visit  Medication Sig   ergocalciferol (DRISDOL) 1.25 MG (50000 UT) capsule Take 1 capsule (50,000 Units total) by mouth once a week.   [DISCONTINUED] amphetamine-dextroamphetamine (ADDERALL) 10 MG tablet Take 0.5-1 tablets (5-10 mg total) by mouth daily as needed.   [DISCONTINUED] lisdexamfetamine (VYVANSE) 40 MG capsule Take 1 capsule (40 mg total) by mouth daily.   No facility-administered medications prior to visit.    Review of Systems See HPI    Last CBC Lab Results  Component Value Date   WBC 5.1 07/09/2021   HGB 12.1 07/09/2021   HCT 36.0 07/09/2021   MCV 97 07/09/2021   MCH 32.4 07/09/2021   RDW 12.4 07/09/2021   PLT 241 07/09/2021   Last metabolic panel Lab Results  Component Value Date   GLUCOSE 98 07/09/2021   NA 137 07/09/2021   K 4.0 07/09/2021   CL 99 07/09/2021   CO2 23 07/09/2021   BUN 12 07/09/2021   CREATININE 0.71 07/09/2021   EGFR 107 07/09/2021   CALCIUM 8.8 07/09/2021   PROT 7.0 07/09/2021   ALBUMIN 4.2 07/09/2021   LABGLOB 2.8 07/09/2021   AGRATIO 1.5 07/09/2021   BILITOT 0.4 07/09/2021   ALKPHOS 61 07/09/2021   AST 22 07/09/2021   ALT 16 07/09/2021   Last lipids Lab Results  Component Value Date   CHOL 194 07/09/2021   HDL 106 07/09/2021   LDLCALC 76 07/09/2021   TRIG 63 07/09/2021   CHOLHDL  1.8 07/09/2021   Last hemoglobin A1c Lab Results  Component Value Date   HGBA1C 4.7 (L) 07/09/2021   Last thyroid functions Lab Results  Component Value Date   TSH 3.990 07/09/2021   Last vitamin D Lab Results  Component Value Date   VD25OH 5.7 (L) 07/09/2021       Objective     Today's Vitals   07/21/22 0854 07/21/22 0933  BP: 116/83   Pulse: (Abnormal) 121 (Abnormal) 101  SpO2: 99%   Weight: 162 lb 6.4 oz (73.7 kg)   Height: 5' 4.96" (1.65 m)    Body mass index is 27.06 kg/m.  BP Readings from Last 3 Encounters:  07/21/22 116/83  04/21/22 119/81  01/19/22 134/86    Wt Readings from Last 3 Encounters:  07/21/22 162 lb 6.4 oz (73.7 kg)  04/21/22 163 lb 6.4 oz (74.1 kg)  01/19/22 167 lb 6.4 oz (75.9 kg)    Physical Exam Vitals and nursing note reviewed.  Constitutional:      Appearance: Normal appearance. She is well-developed.  HENT:     Head: Normocephalic and atraumatic.     Nose: Nose normal.     Mouth/Throat:     Mouth: Mucous membranes are moist.     Pharynx: Oropharynx is clear.  Eyes:     Extraocular Movements: Extraocular movements  intact.     Conjunctiva/sclera: Conjunctivae normal.     Pupils: Pupils are equal, round, and reactive to light.  Neck:     Vascular: No carotid bruit.  Cardiovascular:     Rate and Rhythm: Normal rate and regular rhythm.     Pulses: Normal pulses.     Heart sounds: Normal heart sounds.     Comments: Mild tachycardia - patient states that she did have Red Bull energy drink prior to arrival in office.  Pulmonary:     Effort: Pulmonary effort is normal.     Breath sounds: Normal breath sounds.  Abdominal:     Palpations: Abdomen is soft.  Musculoskeletal:        General: Normal range of motion.     Cervical back: Normal range of motion and neck supple.  Lymphadenopathy:     Cervical: No cervical adenopathy.  Skin:    General: Skin is warm and dry.     Capillary Refill: Capillary refill takes less than 2  seconds.  Neurological:     General: No focal deficit present.     Mental Status: She is alert and oriented to person, place, and time.  Psychiatric:        Mood and Affect: Mood normal.        Behavior: Behavior normal.        Thought Content: Thought content normal.        Judgment: Judgment normal.       Assessment & Plan    Attention deficit disorder (ADD) without hyperactivity Assessment & Plan: Patient doing well with current medication doses: Vyvanse 40 mg in AM Adderall 10 mg - taking 1/2 to 1 tablet in the afternoons as needed  Three 30 day prescriptions were sent to her pharmacy today   Orders: -     Amphetamine-Dextroamphetamine; Take 0.5-1 tablets (5-10 mg total) by mouth daily as needed.  Dispense: 30 tablet; Refill: 0 -     Lisdexamfetamine Dimesylate; Take 1 capsule (40 mg total) by mouth daily.  Dispense: 30 capsule; Refill: 0     Return for as scheduled.         Carlean Jews, NP  [facility] at [facility] [phone] (phone) [phone] (fax)  [facility]

## 2022-07-21 NOTE — Assessment & Plan Note (Signed)
Patient doing well with current medication doses: Vyvanse 40 mg in AM Adderall 10 mg - taking 1/2 to 1 tablet in the afternoons as needed  Three 30 day prescriptions were sent to her pharmacy today

## 2022-08-25 ENCOUNTER — Other Ambulatory Visit: Payer: Self-pay | Admitting: Nurse Practitioner

## 2022-08-25 DIAGNOSIS — Z0000 Encounter for general adult medical examination without abnormal findings: Secondary | ICD-10-CM

## 2022-08-25 DIAGNOSIS — E039 Hypothyroidism, unspecified: Secondary | ICD-10-CM

## 2022-08-25 DIAGNOSIS — E559 Vitamin D deficiency, unspecified: Secondary | ICD-10-CM

## 2022-09-17 ENCOUNTER — Ambulatory Visit
Admission: RE | Admit: 2022-09-17 | Discharge: 2022-09-17 | Disposition: A | Discharge status: Home / Self Care | Payer: 59 | Source: Ambulatory Visit | Attending: Nurse Practitioner | Admitting: Nurse Practitioner

## 2022-09-17 DIAGNOSIS — N6310 Unspecified lump in the right breast, unspecified quadrant: Secondary | ICD-10-CM

## 2022-09-24 ENCOUNTER — Other Ambulatory Visit: Payer: 59

## 2022-09-24 DIAGNOSIS — E039 Hypothyroidism, unspecified: Secondary | ICD-10-CM

## 2022-09-24 DIAGNOSIS — Z0000 Encounter for general adult medical examination without abnormal findings: Secondary | ICD-10-CM

## 2022-09-24 DIAGNOSIS — E559 Vitamin D deficiency, unspecified: Secondary | ICD-10-CM

## 2022-09-25 LAB — CBC WITH DIFFERENTIAL/PLATELET
Basophils Absolute: 0.1 10*3/uL (ref 0.0–0.2)
Basos: 1 %
EOS (ABSOLUTE): 0.2 10*3/uL (ref 0.0–0.4)
Eos: 4 %
Hematocrit: 45.2 % (ref 34.0–46.6)
Hemoglobin: 14.8 g/dL (ref 11.1–15.9)
Immature Grans (Abs): 0 10*3/uL (ref 0.0–0.1)
Immature Granulocytes: 0 %
Lymphocytes Absolute: 1.6 10*3/uL (ref 0.7–3.1)
Lymphs: 33 %
MCH: 33.6 pg — ABNORMAL HIGH (ref 26.6–33.0)
MCHC: 32.7 g/dL (ref 31.5–35.7)
MCV: 103 fL — ABNORMAL HIGH (ref 79–97)
Monocytes Absolute: 0.4 10*3/uL (ref 0.1–0.9)
Monocytes: 7 %
Neutrophils Absolute: 2.7 10*3/uL (ref 1.4–7.0)
Neutrophils: 55 %
Platelets: 270 10*3/uL (ref 150–450)
RBC: 4.41 x10E6/uL (ref 3.77–5.28)
RDW: 12.1 % (ref 11.7–15.4)
WBC: 4.9 10*3/uL (ref 3.4–10.8)

## 2022-09-25 LAB — LIPID PANEL
Chol/HDL Ratio: 2 ratio (ref 0.0–4.4)
Cholesterol, Total: 215 mg/dL — ABNORMAL HIGH (ref 100–199)
HDL: 105 mg/dL (ref >39)
LDL Chol Calc (NIH): 98 mg/dL (ref 0–99)
Triglycerides: 66 mg/dL (ref 0–149)
VLDL Cholesterol Cal: 12 mg/dL (ref 5–40)

## 2022-09-25 LAB — T3, FREE: T3, Free: 3.4 pg/mL (ref 2.0–4.4)

## 2022-09-25 LAB — COMPREHENSIVE METABOLIC PANEL
ALT: 91 IU/L — ABNORMAL HIGH (ref 0–32)
AST: 115 IU/L — ABNORMAL HIGH (ref 0–40)
Albumin/Globulin Ratio: 1.7
Albumin: 4.6 g/dL (ref 3.9–4.9)
Alkaline Phosphatase: 79 IU/L (ref 44–121)
BUN/Creatinine Ratio: 9 (ref 9–23)
BUN: 8 mg/dL (ref 6–24)
Bilirubin Total: 0.5 mg/dL (ref 0.0–1.2)
CO2: 26 mmol/L (ref 20–29)
Calcium: 9.7 mg/dL (ref 8.7–10.2)
Chloride: 101 mmol/L (ref 96–106)
Creatinine, Ser: 0.85 mg/dL (ref 0.57–1.00)
Globulin, Total: 2.7 g/dL (ref 1.5–4.5)
Glucose: 91 mg/dL (ref 70–99)
Potassium: 4.1 mmol/L (ref 3.5–5.2)
Sodium: 141 mmol/L (ref 134–144)
Total Protein: 7.3 g/dL (ref 6.0–8.5)
eGFR: 85 mL/min/{1.73_m2} (ref >59)

## 2022-09-25 LAB — VITAMIN D 25 HYDROXY (VIT D DEFICIENCY, FRACTURES): Vit D, 25-Hydroxy: 45.2 ng/mL (ref 30.0–100.0)

## 2022-09-25 LAB — T4: T4, Total: 7.2 ug/dL (ref 4.5–12.0)

## 2022-09-25 LAB — TSH: TSH: 3.74 u[IU]/mL (ref 0.450–4.500)

## 2022-09-25 LAB — HEMOGLOBIN A1C
Est. average glucose Bld gHb Est-mCnc: 100 mg/dL
Hgb A1c MFr Bld: 5.1 % (ref 4.8–5.6)

## 2022-10-01 ENCOUNTER — Encounter: Payer: Self-pay | Admitting: Nurse Practitioner

## 2022-10-01 ENCOUNTER — Ambulatory Visit (INDEPENDENT_AMBULATORY_CARE_PROVIDER_SITE_OTHER): Payer: 59 | Admitting: Nurse Practitioner

## 2022-10-01 VITALS — BP 109/75 | HR 95 | Ht 64.96 in | Wt 162.0 lb

## 2022-10-01 DIAGNOSIS — Z0000 Encounter for general adult medical examination without abnormal findings: Secondary | ICD-10-CM

## 2022-10-01 DIAGNOSIS — E559 Vitamin D deficiency, unspecified: Secondary | ICD-10-CM | POA: Diagnosis not present

## 2022-10-01 DIAGNOSIS — Z0001 Encounter for general adult medical examination with abnormal findings: Secondary | ICD-10-CM

## 2022-10-01 DIAGNOSIS — F988 Other specified behavioral and emotional disorders with onset usually occurring in childhood and adolescence: Secondary | ICD-10-CM

## 2022-10-01 DIAGNOSIS — R945 Abnormal results of liver function studies: Secondary | ICD-10-CM | POA: Diagnosis not present

## 2022-10-01 DIAGNOSIS — E039 Hypothyroidism, unspecified: Secondary | ICD-10-CM

## 2022-10-01 MED ORDER — LISDEXAMFETAMINE DIMESYLATE 40 MG PO CAPS
40.0000 mg | ORAL_CAPSULE | Freq: Every day | ORAL | 0 refills | Status: DC
Start: 2022-10-01 — End: 2022-10-01

## 2022-10-01 MED ORDER — ERGOCALCIFEROL 1.25 MG (50000 UT) PO CAPS
50000.0000 [IU] | ORAL_CAPSULE | ORAL | 5 refills | Status: DC
Start: 2022-10-01 — End: 2024-01-08

## 2022-10-01 MED ORDER — AMPHETAMINE-DEXTROAMPHETAMINE 10 MG PO TABS
5.0000 mg | ORAL_TABLET | Freq: Every day | ORAL | 0 refills | Status: DC | PRN
Start: 2022-10-01 — End: 2022-10-01

## 2022-10-01 MED ORDER — LISDEXAMFETAMINE DIMESYLATE 40 MG PO CAPS
40.0000 mg | ORAL_CAPSULE | Freq: Every day | ORAL | 0 refills | Status: DC
Start: 2022-10-01 — End: 2023-01-03

## 2022-10-01 MED ORDER — AMPHETAMINE-DEXTROAMPHETAMINE 10 MG PO TABS
5.0000 mg | ORAL_TABLET | Freq: Every day | ORAL | 0 refills | Status: DC | PRN
Start: 2022-10-01 — End: 2023-01-03

## 2022-10-01 NOTE — Progress Notes (Signed)
Complete physical exam   Patient: Kristen Hanna   DOB: 1975/06/02   47 y.o. Female  MRN: [id number] Visit Date: 10/01/2022    Chief Complaint  Patient presents with   Annual Exam   Subjective    Kristen Hanna is a 47 y.o. female who presents today for a complete physical exam.  She reports consuming a  generally healthy  diet. Home exercise routine includes walking 1 hrs per day. She generally feels well. She does not have additional problems to discuss today.    HPI  Annual physical  History of Concentration deficit  -Taking Vyvanse 40 mg daily and adderall 10 mg in afternoon if needed  -she states that she is doing well with current medication dosages   Routine, fasting labs done prior to today's visit  -mild elevation of total cholesterol with remaining labs being normal.  -moderate elevation of AST and ALT when they have never previously been elevated.   -She denies chest pain, chest pressure, or shortness of breath. She denies headaches or visual disturbances. She denies abdominal pain, nausea, vomiting, or changes in bowel or bladder habits.   Past Medical History:  Diagnosis Date   ADHD    Anxiety [id number]   Mass layoffs at work   Past Surgical History:  Procedure Laterality Date   EYE SURGERY  2005   Lasic   NO PAST SURGERIES     Social History   Socioeconomic History   Marital status: Divorced    Spouse name: Not on file   Number of children: Not on file   Years of education: Not on file   Highest education level: Not on file  Occupational History   Not on file  Tobacco Use   Smoking status: Former    Packs/day: 1.00    Years: 15.00    Additional pack years: 0.00    Total pack years: 15.00    Types: Cigarettes    Quit date: 09/18/2015    Years since quitting: 7.0   Smokeless tobacco: Never   Tobacco comments:    Was done with it when my parents moved in with me:)  Substance and Sexual Activity   Alcohol use: Yes    Alcohol/week: 7.0  standard drinks of alcohol    Types: 7 Cans of beer per week   Drug use: Never   Sexual activity: Not Currently    Birth control/protection: I.U.D.  Other Topics Concern   Not on file  Social History Narrative   Not on file   Social Determinants of Health   Financial Resource Strain: Not on file  Food Insecurity: Not on file  Transportation Needs: Not on file  Physical Activity: Not on file  Stress: Not on file  Social Connections: Not on file  Intimate Partner Violence: Not on file   Family Status  Relation Name Status   Mother Dysart Alive   Father Mikhail Alive   Sister  Alive   Brother  Alive   Daughter Social worker (Not Specified)   Pat Aunt Lucius Conn (Not Specified)   Family History  Problem Relation Age of Onset   Diabetes Mother    Diabetes Father    ADD / ADHD Daughter    Arthritis Paternal Aunt    No Known Allergies  Patient Care Team: Carlean Jews, NP as PCP - General (Family Medicine)   Medications: Outpatient Medications Prior to Visit  Medication Sig   [DISCONTINUED] amphetamine-dextroamphetamine (ADDERALL) 10 MG tablet Take 0.5-1 tablets (  5-10 mg total) by mouth daily as needed.   [DISCONTINUED] ergocalciferol (DRISDOL) 1.25 MG (50000 UT) capsule Take 1 capsule (50,000 Units total) by mouth once a week.   [DISCONTINUED] lisdexamfetamine (VYVANSE) 40 MG capsule Take 1 capsule (40 mg total) by mouth daily.   No facility-administered medications prior to visit.    Review of Systems See HPI    Last CBC Lab Results  Component Value Date   WBC 4.9 09/24/2022   HGB 14.8 09/24/2022   HCT 45.2 09/24/2022   MCV 103 (H) 09/24/2022   MCH 33.6 (H) 09/24/2022   RDW 12.1 09/24/2022   PLT 270 09/24/2022   Last metabolic panel Lab Results  Component Value Date   GLUCOSE 91 09/24/2022   NA 141 09/24/2022   K 4.1 09/24/2022   CL 101 09/24/2022   CO2 26 09/24/2022   BUN 8 09/24/2022   CREATININE 0.85 09/24/2022   EGFR 85  09/24/2022   CALCIUM 9.7 09/24/2022   PROT 7.3 09/24/2022   ALBUMIN 4.6 09/24/2022   LABGLOB 2.7 09/24/2022   AGRATIO 1.7 09/24/2022   BILITOT 0.5 09/24/2022   ALKPHOS 79 09/24/2022   AST 115 (H) 09/24/2022   ALT 91 (H) 09/24/2022   Last lipids Lab Results  Component Value Date   CHOL 215 (H) 09/24/2022   HDL 105 09/24/2022   LDLCALC 98 09/24/2022   TRIG 66 09/24/2022   CHOLHDL 2.0 09/24/2022   Last hemoglobin A1c Lab Results  Component Value Date   HGBA1C 5.1 09/24/2022   Last thyroid functions Lab Results  Component Value Date   TSH 3.740 09/24/2022   T4TOTAL 7.2 09/24/2022   Last vitamin D Lab Results  Component Value Date   VD25OH 45.2 09/24/2022       Objective     Today's Vitals   10/01/22 0909  BP: 109/75  Pulse: 95  SpO2: 99%  Weight: 162 lb (73.5 kg)  Height: 5' 4.96" (1.65 m)   Body mass index is 26.99 kg/m.  BP Readings from Last 3 Encounters:  10/01/22 109/75  07/21/22 116/83  04/21/22 119/81    Wt Readings from Last 3 Encounters:  10/01/22 162 lb (73.5 kg)  07/21/22 162 lb 6.4 oz (73.7 kg)  04/21/22 163 lb 6.4 oz (74.1 kg)     Physical Exam Vitals and nursing note reviewed.  Constitutional:      Appearance: Normal appearance. She is well-developed.  HENT:     Head: Normocephalic and atraumatic.     Right Ear: Tympanic membrane, ear canal and external ear normal.     Left Ear: Tympanic membrane, ear canal and external ear normal.     Nose: Nose normal.     Mouth/Throat:     Mouth: Mucous membranes are moist.     Pharynx: Oropharynx is clear.  Eyes:     Extraocular Movements: Extraocular movements intact.     Conjunctiva/sclera: Conjunctivae normal.     Pupils: Pupils are equal, round, and reactive to light.  Neck:     Vascular: No carotid bruit.  Cardiovascular:     Rate and Rhythm: Normal rate and regular rhythm.     Pulses: Normal pulses.     Heart sounds: Normal heart sounds.  Pulmonary:     Effort: Pulmonary effort  is normal.     Breath sounds: Normal breath sounds.  Abdominal:     General: Bowel sounds are normal. There is no distension.     Palpations: Abdomen is soft. There is no  mass.     Tenderness: There is no abdominal tenderness. There is no right CVA tenderness, left CVA tenderness, guarding or rebound.     Hernia: No hernia is present.  Musculoskeletal:        General: Normal range of motion.     Cervical back: Normal range of motion and neck supple.  Lymphadenopathy:     Cervical: No cervical adenopathy.  Skin:    General: Skin is warm and dry.     Capillary Refill: Capillary refill takes less than 2 seconds.  Neurological:     General: No focal deficit present.     Mental Status: She is alert and oriented to person, place, and time.  Psychiatric:        Mood and Affect: Mood normal.        Behavior: Behavior normal.        Thought Content: Thought content normal.        Judgment: Judgment normal.    Last depression screening scores   Row Labels 10/01/2022    9:12 AM 07/21/2022    8:56 AM 04/21/2022    8:58 AM  PHQ 2/9 Scores   Section Header. No data exists in this row.     PHQ - 2 Score   0 0 0  PHQ- 9 Score   0 0 1   Last fall risk screening   Row Labels 07/21/2022    8:55 AM  Fall Risk    Section Header. No data exists in this row.   Falls in the past year?   0  Number falls in past yr:   0  Injury with Fall?   0  Follow up   Falls evaluation completed     Assessment & Plan    Encounter for general adult medical examination with abnormal findings  Abnormal liver function Assessment & Plan: Moderate elevation of AST and ALT with recent labs.  Hepatic Function Panel     Component Value Date/Time   PROT 7.3 09/24/2022 0828   ALBUMIN 4.6 09/24/2022 0828   AST 115 (H) 09/24/2022 0828   ALT 91 (H) 09/24/2022 0828   ALKPHOS 79 09/24/2022 0828   BILITOT 0.5 09/24/2022 0828   Check hepatic function panel and acute hepatitis panel today for further evaluation.    Orders: -     Hepatic function panel; Future -     Acute Hep Panel & Hep B Surface Ab; Future  Attention deficit disorder (ADD) without hyperactivity Assessment & Plan: Patient doing well with current medication doses: Vyvanse 40 mg in AM Adderall 10 mg - taking 1/2 to 1 tablet in the afternoons as needed  Three 30 day prescriptions were sent to her pharmacy today   Orders: -     Lisdexamfetamine Dimesylate; Take 1 capsule (40 mg total) by mouth daily.  Dispense: 30 capsule; Refill: 0 -     Amphetamine-Dextroamphetamine; Take 0.5-1 tablets (5-10 mg total) by mouth daily as needed.  Dispense: 30 tablet; Refill: 0  Vitamin D deficiency Assessment & Plan: Vitamin d low/normal on recent labs.  Last vitamin D Lab Results  Component Value Date   VD25OH 45.2 09/24/2022   Continue with treatment with drisdol weekly for next 6 months. Recheck Vitamin d in 6 months   Orders: -     Ergocalciferol; Take 1 capsule (50,000 Units total) by mouth once a week.  Dispense: 4 capsule; Refill: 5      Immunization History  Administered Date(s) Administered  PFIZER(Purple Top)SARS-COV-2 Vaccination 09/11/2019, 10/02/2019   Tdap 07/20/2021    Health Maintenance  Topic Date Due   HIV Screening  Never done   Hepatitis C Screening  Never done   COVID-19 Vaccine (3 - Pfizer risk series) 10/30/2019   INFLUENZA VACCINE  11/11/2022   PAP SMEAR-Modifier  07/20/2024   Fecal DNA (Cologuard)  08/18/2024   DTaP/Tdap/Td (2 - Td or Tdap) 07/21/2031   HPV VACCINES  Aged Out    Discussed health benefits of physical activity, and encouraged her to engage in regular exercise appropriate for her age and condition.  Return in about 3 months (around 01/01/2023) for ADD, med refills.        Carlean Jews, NP  [facility] [phone] (phone) [phone] (fax)  [facility]

## 2022-10-01 NOTE — Assessment & Plan Note (Signed)
Vitamin d low/normal on recent labs.  Last vitamin D Lab Results  Component Value Date   VD25OH 45.2 09/24/2022   Continue with treatment with drisdol weekly for next 6 months. Recheck Vitamin d in 6 months

## 2022-10-01 NOTE — Assessment & Plan Note (Signed)
Moderate elevation of AST and ALT with recent labs.  Hepatic Function Panel     Component Value Date/Time   PROT 7.3 09/24/2022 0828   ALBUMIN 4.6 09/24/2022 0828   AST 115 (H) 09/24/2022 0828   ALT 91 (H) 09/24/2022 0828   ALKPHOS 79 09/24/2022 0828   BILITOT 0.5 09/24/2022 0828   Check hepatic function panel and acute hepatitis panel today for further evaluation.

## 2022-10-01 NOTE — Assessment & Plan Note (Signed)
Patient doing well with current medication doses: Vyvanse 40 mg in AM Adderall 10 mg - taking 1/2 to 1 tablet in the afternoons as needed  Three 30 day prescriptions were sent to her pharmacy today  

## 2022-10-02 LAB — ACUTE HEP PANEL AND HEP B SURFACE AB
Hep A IgM: NEGATIVE
Hep B C IgM: NEGATIVE
Hep C Virus Ab: NONREACTIVE
Hepatitis B Surf Ab Quant: <3.5 m[IU]/mL — ABNORMAL LOW (ref >9.9)
Hepatitis B Surface Ag: NEGATIVE

## 2022-10-02 LAB — HEPATIC FUNCTION PANEL
ALT: 84 IU/L — ABNORMAL HIGH (ref 0–32)
AST: 91 IU/L — ABNORMAL HIGH (ref 0–40)
Albumin: 4.5 g/dL (ref 3.9–4.9)
Alkaline Phosphatase: 80 IU/L (ref 44–121)
Bilirubin Total: 0.8 mg/dL (ref 0.0–1.2)
Bilirubin, Direct: 0.21 mg/dL (ref 0.00–0.40)
Total Protein: 7.3 g/dL (ref 6.0–8.5)

## 2022-10-11 NOTE — Progress Notes (Signed)
Functions slightly improved.  Acute hepatitis panel was normal.  Reassess at next visit.  Consider abdominal ultrasound for further evaluation if liver enzymes continue to be elevated.

## 2022-12-23 ENCOUNTER — Other Ambulatory Visit: Payer: Self-pay | Admitting: Family Medicine

## 2022-12-23 ENCOUNTER — Encounter: Payer: Self-pay | Admitting: Family Medicine

## 2022-12-23 DIAGNOSIS — R945 Abnormal results of liver function studies: Secondary | ICD-10-CM

## 2022-12-30 ENCOUNTER — Other Ambulatory Visit: Payer: 59

## 2022-12-30 DIAGNOSIS — R945 Abnormal results of liver function studies: Secondary | ICD-10-CM

## 2022-12-31 ENCOUNTER — Other Ambulatory Visit: Payer: Self-pay | Admitting: Family Medicine

## 2022-12-31 DIAGNOSIS — R7989 Other specified abnormal findings of blood chemistry: Secondary | ICD-10-CM

## 2022-12-31 LAB — COMPREHENSIVE METABOLIC PANEL
ALT: 127 IU/L — ABNORMAL HIGH (ref 0–32)
AST: 134 IU/L — ABNORMAL HIGH (ref 0–40)
Albumin: 4.8 g/dL (ref 3.9–4.9)
Alkaline Phosphatase: 85 IU/L (ref 44–121)
BUN/Creatinine Ratio: 10 (ref 9–23)
BUN: 9 mg/dL (ref 6–24)
Bilirubin Total: 0.4 mg/dL (ref 0.0–1.2)
CO2: 24 mmol/L (ref 20–29)
Calcium: 9.8 mg/dL (ref 8.7–10.2)
Chloride: 100 mmol/L (ref 96–106)
Creatinine, Ser: 0.91 mg/dL (ref 0.57–1.00)
Globulin, Total: 2.9 g/dL (ref 1.5–4.5)
Glucose: 90 mg/dL (ref 70–99)
Potassium: 4.4 mmol/L (ref 3.5–5.2)
Sodium: 144 mmol/L (ref 134–144)
Total Protein: 7.7 g/dL (ref 6.0–8.5)
eGFR: 78 mL/min/{1.73_m2} (ref >59)

## 2023-01-03 ENCOUNTER — Ambulatory Visit: Payer: 59 | Admitting: Family Medicine

## 2023-01-03 ENCOUNTER — Encounter: Payer: Self-pay | Admitting: Family Medicine

## 2023-01-03 VITALS — BP 112/79 | HR 90 | Ht 64.96 in | Wt 164.1 lb

## 2023-01-03 DIAGNOSIS — F988 Other specified behavioral and emotional disorders with onset usually occurring in childhood and adolescence: Secondary | ICD-10-CM | POA: Diagnosis not present

## 2023-01-03 DIAGNOSIS — R945 Abnormal results of liver function studies: Secondary | ICD-10-CM | POA: Diagnosis not present

## 2023-01-03 DIAGNOSIS — Z789 Other specified health status: Secondary | ICD-10-CM | POA: Insufficient documentation

## 2023-01-03 MED ORDER — AMPHETAMINE-DEXTROAMPHETAMINE 10 MG PO TABS: 5.0000 mg | ORAL_TABLET | Freq: Every day | ORAL | 0 refills | Status: DC | PRN

## 2023-01-03 MED ORDER — LISDEXAMFETAMINE DIMESYLATE 40 MG PO CAPS: 40.0000 mg | ORAL_CAPSULE | Freq: Every day | ORAL | 0 refills | Status: DC

## 2023-01-03 NOTE — Assessment & Plan Note (Addendum)
Continues to have abnormal liver functions.  Most recent 1 was more elevated than previously.  Will continue workup today.  Previously negative results included viral hepatitis panel.  Will get ceruloplasmin, repeat CMP, ANA, anti-smooth muscle, iron studies.  Pending these results consider abdominal imaging versus referral to hepatology.  Is a daily alcohol user but in the past few years has reduced this amount compared to previous.  Less likely that this is a cause given the recent increase in LFT levels correlated with a decrease in alcohol consumption.

## 2023-01-03 NOTE — Progress Notes (Unsigned)
Established Patient Office Visit  Subjective   Patient ID: Kristen Hanna, female    DOB: 1975-08-04  Age: 47 y.o. MRN: [id number]  Chief Complaint  Patient presents with   Medical Management of Chronic Issues    HPI  Elevated LFTs-we discussed the potential causes of this.  Patient previously was drinking red bull daily.  Used to drink 4 red bull drinks a day now drinking 1.  Stopped drinking red bull altogether 4 days ago and has not restarted.  Patient drinks 3-4 truly hard seltzers a day.  Was previously drinking liquor prior to that but quit 2 years ago.  Patient previously had to take iron supplement for low iron but has not taken this in years.  Patient is from [place] and was there during the [place] incident.  States she has had issues with thyroid in the past but does not take medication for this.  Patient states she has an copper IUD placed in 2019 December.  Elevated heart rate-patient states that she notices sometimes her heart rate will increase.  This is usually triggered by something such as drinking something with caffeine, being agitated.  Occurred during sleep 2 times but she was having a nightmare during this.  ADHD-patient has been taking Vyvanse and Adderall for several years.  Has been on some sort of ADHD medication since 2011.  Tolerating well.  No concerns.   The ASCVD Risk score (Arnett DK, et al., 2019) failed to calculate for the following reasons:   The valid HDL cholesterol range is 20 to 100 mg/dL  Health Maintenance Due  Topic Date Due   HIV Screening  Never done   COVID-19 Vaccine (3 - Pfizer risk series) 10/30/2019      Objective:     BP 112/79   Pulse 90   Ht 5' 4.96" (1.65 m)   Wt 164 lb 1.9 oz (74.4 kg)   SpO2 99%   BMI 27.34 kg/m    Physical Exam General: Alert, oriented HEENT: PERRLA, EOMI, normal eye exam. CV: Regular rhythm no murmurs Pulmonary lungs are bilaterally GI: Soft, nontender to palpation.  No hepatosplenomegaly.   Normal bowel sounds.   No results found for any visits on 01/03/23.      Assessment & Plan:   Abnormal liver function Assessment & Plan: Continues to have abnormal liver functions.  Most recent 1 was more elevated than previously.  Will continue workup today.  Previously negative results included viral hepatitis panel.  Will get ceruloplasmin, repeat CMP, ANA, anti-smooth muscle, iron studies.  Pending these results consider abdominal imaging versus referral to hepatology.  Is a daily alcohol user but in the past few years has reduced this amount compared to previous.  Less likely that this is a cause given the recent increase in LFT levels correlated with a decrease in alcohol consumption.  Orders: -     Comprehensive metabolic panel  Attention deficit disorder (ADD) without hyperactivity Assessment & Plan: Tolerates her current medication of Vyvanse and Adderall well.  Takes Vyvanse the morning, Adderall in the at afternoon as needed.  Has been doing this for several years.  Follows up every 3 months.   Alcohol use Assessment & Plan: Advised patient to reduce alcohol consumption.  Currently drinks 3-4 truly hard seltzers a day.  Was previously drinking liquor on a daily basis prior to that.    32 minutes was spent in the management of this patient  Return in about 3 months (around 04/04/2023) for  ADHD.    Kristen Kitty, MD

## 2023-01-03 NOTE — Assessment & Plan Note (Signed)
Tolerates her current medication of Vyvanse and Adderall well.  Takes Vyvanse the morning, Adderall in the at afternoon as needed.  Has been doing this for several years.  Follows up every 3 months.

## 2023-01-03 NOTE — Patient Instructions (Signed)
It was nice to see you today,  We addressed the following topics today: -I will let you know the results of your blood work and I get them. - Try to avoid caffeine in the form of red bulls for coffee if this is causing your heart rate to elevate - Try to limit your alcohol consumption to 1-2 drinks a day. - Follow-up with me in 3 months  Have a great day,  Frederic Jericho, MD

## 2023-01-03 NOTE — Assessment & Plan Note (Signed)
Advised patient to reduce alcohol consumption.  Currently drinks 3-4 truly hard seltzers a day.  Was previously drinking liquor on a daily basis prior to that.

## 2023-01-04 LAB — COMPREHENSIVE METABOLIC PANEL
ALT: 95 IU/L — ABNORMAL HIGH (ref 0–32)
AST: 80 IU/L — ABNORMAL HIGH (ref 0–40)
Albumin: 4.6 g/dL (ref 3.9–4.9)
Alkaline Phosphatase: 77 IU/L (ref 44–121)
BUN/Creatinine Ratio: 13 (ref 9–23)
BUN: 11 mg/dL (ref 6–24)
Bilirubin Total: 0.5 mg/dL (ref 0.0–1.2)
CO2: 25 mmol/L (ref 20–29)
Calcium: 10 mg/dL (ref 8.7–10.2)
Chloride: 98 mmol/L (ref 96–106)
Creatinine, Ser: 0.83 mg/dL (ref 0.57–1.00)
Globulin, Total: 2.7 g/dL (ref 1.5–4.5)
Glucose: 92 mg/dL (ref 70–99)
Potassium: 4.3 mmol/L (ref 3.5–5.2)
Sodium: 140 mmol/L (ref 134–144)
Total Protein: 7.3 g/dL (ref 6.0–8.5)
eGFR: 87 mL/min/{1.73_m2} (ref >59)

## 2023-01-05 ENCOUNTER — Other Ambulatory Visit: Payer: 59

## 2023-01-05 DIAGNOSIS — R7989 Other specified abnormal findings of blood chemistry: Secondary | ICD-10-CM

## 2023-01-06 LAB — IRON,TIBC AND FERRITIN PANEL
Ferritin: 56 ng/mL (ref 15–150)
Iron Saturation: 32 % (ref 15–55)
Iron: 110 ug/dL (ref 27–159)
Total Iron Binding Capacity: 340 ug/dL (ref 250–450)
UIBC: 230 ug/dL (ref 131–425)

## 2023-01-06 LAB — ANA W/REFLEX IF POSITIVE
Anti JO-1: <0.2 AI (ref 0.0–0.9)
Anti Nuclear Antibody (ANA): POSITIVE — AB
Centromere Ab Screen: <0.2 AI (ref 0.0–0.9)
Chromatin Ab SerPl-aCnc: <0.2 AI (ref 0.0–0.9)
ENA RNP Ab: 0.5 AI (ref 0.0–0.9)
ENA SM Ab Ser-aCnc: <0.2 AI (ref 0.0–0.9)
ENA SSA (RO) Ab: 0.2 AI (ref 0.0–0.9)
ENA SSB (LA) Ab: <0.2 AI (ref 0.0–0.9)
Scleroderma (Scl-70) (ENA) Antibody, IgG: <0.2 AI (ref 0.0–0.9)
dsDNA Ab: 16 IU/mL — ABNORMAL HIGH (ref 0–9)

## 2023-01-06 LAB — CERULOPLASMIN: Ceruloplasmin: 27.2 mg/dL (ref 19.0–39.0)

## 2023-01-06 LAB — ANTI-SMOOTH MUSCLE ANTIBODY, IGG: Smooth Muscle Ab: 7 Units (ref 0–19)

## 2023-01-13 ENCOUNTER — Other Ambulatory Visit: Payer: Self-pay | Admitting: Family Medicine

## 2023-01-13 DIAGNOSIS — R768 Other specified abnormal immunological findings in serum: Secondary | ICD-10-CM

## 2023-01-13 DIAGNOSIS — R945 Abnormal results of liver function studies: Secondary | ICD-10-CM

## 2023-01-13 NOTE — Progress Notes (Signed)
Sending in referral to rheumatology after workup for elevated liver enzymes show a positive double-stranded DNA and ANA.Marland Kitchen  Patient otherwise asymptomatic.

## 2023-03-07 ENCOUNTER — Encounter: Payer: Self-pay | Admitting: Family Medicine

## 2023-03-07 ENCOUNTER — Other Ambulatory Visit: Payer: Self-pay | Admitting: Family Medicine

## 2023-03-07 DIAGNOSIS — F988 Other specified behavioral and emotional disorders with onset usually occurring in childhood and adolescence: Secondary | ICD-10-CM

## 2023-03-31 ENCOUNTER — Ambulatory Visit: Payer: 59 | Admitting: Family Medicine

## 2023-03-31 VITALS — BP 118/82 | HR 105 | Ht 64.96 in | Wt 173.0 lb

## 2023-03-31 DIAGNOSIS — F988 Other specified behavioral and emotional disorders with onset usually occurring in childhood and adolescence: Secondary | ICD-10-CM | POA: Diagnosis not present

## 2023-03-31 DIAGNOSIS — Z0183 Encounter for blood typing: Secondary | ICD-10-CM | POA: Insufficient documentation

## 2023-03-31 DIAGNOSIS — R945 Abnormal results of liver function studies: Secondary | ICD-10-CM | POA: Diagnosis not present

## 2023-03-31 MED ORDER — LISDEXAMFETAMINE DIMESYLATE 40 MG PO CAPS: ORAL_CAPSULE | ORAL | 0 refills | Status: DC

## 2023-03-31 MED ORDER — AMPHETAMINE-DEXTROAMPHETAMINE 10 MG PO TABS: ORAL_TABLET | ORAL | 0 refills | Status: DC

## 2023-03-31 NOTE — Assessment & Plan Note (Signed)
ANA and dsDNA were positive.  Rheumatology appointment is scheduled for March.  Patient agreeable to liver elastography.  Repeat hepatic function test today.

## 2023-03-31 NOTE — Assessment & Plan Note (Signed)
Sent in refills for the next 3 months.  Next refill on the 23rd instead of the 25th because patient will be out of town on the 25th.

## 2023-03-31 NOTE — Patient Instructions (Signed)
It was nice to see you today,  We addressed the following topics today: -I am going to send an order for something called an elastography ultrasound.  This will look for any fibrosis or damage to your liver. - I will repeat the liver function test today. - I will send in 3 months of your ADD medication.  Have a great day,  Frederic Jericho, MD

## 2023-03-31 NOTE — Progress Notes (Signed)
   Established Patient Office Visit  Subjective   Patient ID: Kristen Hanna, female    DOB: 08-20-1975  Age: 47 y.o. MRN: [id number]  Chief Complaint  Patient presents with   Medical Management of Chronic Issues    HPI  Elevated LFTs-we discussed patient's lab results and discussed imaging such as liver elastography.  Patient agreeable to getting ultrasound.  She is agreeable to repeating liver function testing.  She has a rheumatology appointment scheduled in March.  ADHD-patient has no issues with the medication.  She would like her new prescription sent on the 23rd because she is leaving for vacation that day and will be able to pick it up on the 25th.  Patient wants to know what her blood type is.  Wants to know know if we can test that for her.   The ASCVD Risk score (Arnett DK, et al., 2019) failed to calculate for the following reasons:   The valid HDL cholesterol range is 20 to 100 mg/dL  Health Maintenance Due  Topic Date Due   HIV Screening  Never done   COVID-19 Vaccine (3 - Pfizer risk series) 10/30/2019      Objective:     BP 118/82   Pulse (!) 105   Ht 5' 4.96" (1.65 m)   Wt 173 lb (78.5 kg)   SpO2 97%   BMI 28.82 kg/m    Physical Exam General: Alert, oriented Pulmonary: No respiratory stress Psych: Pleasant affect.   No results found for any visits on 03/31/23.      Assessment & Plan:   Abnormal liver function Assessment & Plan: ANA and dsDNA were positive.  Rheumatology appointment is scheduled for March.  Patient agreeable to liver elastography.  Repeat hepatic function test today.  Orders: -     [place] ELASTOGRAPHY LIVER; Future -     Hepatic function panel -     ABO AND RH   Attention deficit disorder (ADD) without hyperactivity Assessment & Plan: Sent in refills for the next 3 months.  Next refill on the 23rd instead of the 25th because patient will be out of town on the 25th.  Orders: -     Amphetamine-Dextroamphetamine; TAKE 1/2  TO 1 TABLET BY MOUTH DAILY AS NEEDED.  Dispense: 30 tablet; Refill: 0 -     Lisdexamfetamine Dimesylate; TAKE 1 CAPSULE (40 MG TOTAL) BY MOUTH DAILY.  Dispense: 30 capsule; Refill: 0 -     Amphetamine-Dextroamphetamine; TAKE 1/2 TO 1 TABLET BY MOUTH DAILY AS NEEDED.  Dispense: 30 tablet; Refill: 0 -     Amphetamine-Dextroamphetamine; TAKE 1/2 TO 1 TABLET BY MOUTH DAILY AS NEEDED.  Dispense: 30 tablet; Refill: 0 -     Lisdexamfetamine Dimesylate; TAKE 1 CAPSULE (40 MG TOTAL) BY MOUTH DAILY.  Dispense: 30 capsule; Refill: 0 -     Lisdexamfetamine Dimesylate; TAKE 1 CAPSULE (40 MG TOTAL) BY MOUTH DAILY.  Dispense: 30 capsule; Refill: 0     Return in about 3 months (around 06/29/2023) for ADD, liver.    [name], MD

## 2023-04-01 LAB — HEPATIC FUNCTION PANEL
ALT: 42 [IU]/L — ABNORMAL HIGH (ref 0–32)
AST: 50 [IU]/L — ABNORMAL HIGH (ref 0–40)
Albumin: 4.4 g/dL (ref 3.9–4.9)
Alkaline Phosphatase: 92 [IU]/L (ref 44–121)
Bilirubin Total: 0.5 mg/dL (ref 0.0–1.2)
Bilirubin, Direct: 0.19 mg/dL (ref 0.00–0.40)
Total Protein: 7.4 g/dL (ref 6.0–8.5)

## 2023-04-01 LAB — ABO AND RH: Rh Factor: POSITIVE

## 2023-04-18 ENCOUNTER — Other Ambulatory Visit: Payer: 59

## 2023-05-03 ENCOUNTER — Other Ambulatory Visit: Payer: 59

## 2023-05-10 ENCOUNTER — Ambulatory Visit
Admission: RE | Admit: 2023-05-10 | Discharge: 2023-05-10 | Disposition: A | Discharge status: Home / Self Care | Payer: 59 | Source: Ambulatory Visit | Attending: Family Medicine | Admitting: Family Medicine

## 2023-05-10 ENCOUNTER — Encounter: Payer: Self-pay | Admitting: Family Medicine

## 2023-05-10 DIAGNOSIS — R945 Abnormal results of liver function studies: Secondary | ICD-10-CM

## 2023-05-27 ENCOUNTER — Encounter: Payer: Self-pay | Admitting: Family Medicine

## 2023-05-30 ENCOUNTER — Other Ambulatory Visit: Payer: Self-pay | Admitting: Family Medicine

## 2023-05-30 DIAGNOSIS — Z202 Contact with and (suspected) exposure to infections with a predominantly sexual mode of transmission: Secondary | ICD-10-CM

## 2023-06-13 NOTE — Progress Notes (Signed)
 Office Visit Note  Patient: Kristen Hanna             Date of Birth: February 02, 1976           MRN: [id number]             PCP: [name], MD Referring: [name], MD Visit Date: 06/27/2023 Occupation: @GUAROCC @  Subjective:  Positive ANA and double-stranded DNA   History of Present Illness: Kristen Hanna is a 48 y.o. female seen for the evaluation of positive ANA and double-stranded DNA.  Patient states she went for a physical and was found to have elevated LFTs.  When she had further workup for ANA and double-stranded DNA was positive and she was referred to me for further evaluation.  She had a ultrasound of the liver on May 10, 2023 which showed changes consistent with hepatic steatosis.  Patient states that she she started having right shoulder joint pain about a year ago which resolved few months later.  She was having difficulty reaching her bra and getting dressed.  Symptoms resolved by itself.  She states she also used to have episodes of tachycardia which is stopped when she quit drinking the red bull and stop vaping.  She states that she was diagnosed with alopecia areata as a teenager.  Her symptoms started at age 73.  She works as a Pharmacist, hospital for [employer].  She does woodwork.  She is active and occasionally walks.  She states she has to take the stairs several times a day at her home.  She is gravida 2, para 2.  There is no history of preeclampsia or DVT.  She is right-handed.  This family history of Hashimoto's in her mother and her sister.    Activities of Daily Living:  Patient reports morning stiffness for 0 minute.   Patient Denies nocturnal pain.  Difficulty dressing/grooming: Denies Difficulty climbing stairs: Denies Difficulty getting out of chair: Denies Difficulty using hands for taps, buttons, cutlery, and/or writing: Denies  Review of Systems  Constitutional:  Negative for fatigue.  HENT:  Negative for mouth sores and mouth dryness.    Eyes:  Negative for dryness.  Respiratory:  Negative for shortness of breath.   Cardiovascular:  Negative for chest pain and palpitations.  Gastrointestinal:  Negative for blood in stool, constipation and diarrhea.  Endocrine: Negative for increased urination.  Genitourinary:  Negative for involuntary urination.  Musculoskeletal:  Negative for joint pain, gait problem, joint pain, joint swelling, myalgias, muscle weakness, morning stiffness, muscle tenderness and myalgias.  Skin:  Positive for hair loss. Negative for color change, rash and sensitivity to sunlight.  Allergic/Immunologic: Negative for susceptible to infections.  Neurological:  Negative for dizziness and headaches.  Hematological:  Negative for swollen glands.  Psychiatric/Behavioral:  Negative for depressed mood and sleep disturbance. The patient is not nervous/anxious.     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   Encounter for medical examination to establish care 03/03/2021    Past Medical History:  Diagnosis Date   ADHD    Anxiety [id number]   Mass layoffs at work    Family History  Problem Relation Age of Onset   Diabetes Mother  Diabetes Father    ADD / ADHD Daughter    Arthritis Paternal Aunt    Past Surgical History:  Procedure Laterality Date   EYE SURGERY  2005   Lasic   NO PAST SURGERIES     Social History   Social History Narrative   Not on file   Immunization History  Administered Date(s) Administered   PFIZER(Purple Top)SARS-COV-2 Vaccination 09/11/2019, 10/02/2019   Tdap 07/20/2021     Objective: Vital Signs: BP 110/80 (BP Location: Right Arm, Patient Position: Sitting, Cuff Size:  Large)   Pulse 83   Resp 14   Ht 5\' 5"  (1.651 m)   Wt 165 lb (74.8 kg)   BMI 27.46 kg/m    Physical Exam Vitals and nursing note reviewed.  Constitutional:      Appearance: She is well-developed.  HENT:     Head: Normocephalic and atraumatic.  Eyes:     Conjunctiva/sclera: Conjunctivae normal.  Cardiovascular:     Rate and Rhythm: Normal rate and regular rhythm.     Heart sounds: Normal heart sounds.  Pulmonary:     Effort: Pulmonary effort is normal.     Breath sounds: Normal breath sounds.  Abdominal:     General: Bowel sounds are normal.     Palpations: Abdomen is soft.  Musculoskeletal:     Cervical back: Normal range of motion.  Lymphadenopathy:     Cervical: No cervical adenopathy.  Skin:    General: Skin is warm and dry.     Capillary Refill: Capillary refill takes less than 2 seconds.     Comments: She had good capillary refill with no nailbed capillary changes.  No sclerodactyly was noted.  Alopecia was noted.  Neurological:     Mental Status: She is alert and oriented to person, place, and time.  Psychiatric:        Behavior: Behavior normal.      Musculoskeletal Exam: Cervical, thoracic and lumbar spine 1 good range of motion.  Shoulders, elbows, wrist joints, MCPs PIPs and DIPs with good range of motion with no synovitis.  Hip joints, knee joints, ankles, MTPs and PIPs in good range of motion with no synovitis.  She had no hyperalgesia.  CDAI Exam: CDAI Score: -- Patient Global: --; Provider Global: -- Swollen: --; Tender: -- Joint Exam 06/27/2023   No joint exam has been documented for this visit   There is currently no information documented on the homunculus. Go to the Rheumatology activity and complete the homunculus joint exam.  Investigation: No additional findings.  Imaging: No results found.  Recent Labs: Lab Results  Component Value Date   WBC 4.9 09/24/2022   HGB 14.8 09/24/2022   PLT 270 09/24/2022   NA 140 01/03/2023   K 4.3  01/03/2023   CL 98 01/03/2023   CO2 25 01/03/2023   GLUCOSE 92 01/03/2023   BUN 11 01/03/2023   CREATININE 0.83 01/03/2023   BILITOT 0.5 03/31/2023   ALKPHOS 92 03/31/2023   AST 50 (H) 03/31/2023   ALT 42 (H) 03/31/2023   PROT 7.4 03/31/2023   ALBUMIN 4.4 03/31/2023   CALCIUM 10.0 01/03/2023   January 05, 2023 ANA positive, dsDNA 16, (RNP, Katrinka Blazing, SCL 70, SSA, SSB, chromatin, Jo 1, centromere negative), iron studies normal, ceruloplasmin normal, smooth muscle antibody negative  March 31, 2023 AST 50, ALT 42  Speciality Comments: No specialty comments available.  Procedures:  No procedures performed Allergies: Patient has no known allergies.   Assessment / Plan:  Visit Diagnoses: Positive ANA (antinuclear antibody) - Positive ANA, positive double-stranded DNA -patient is positive ANA and double-stranded DNA in September 2024.  She denies any history of oral ulcers, nasal ulcers, malar rash, photosensitivity, Raynaud's, lymphadenopathy or inflammatory arthritis.  No synovitis was noted on the examination.  She has had hair loss since she was 48 years old and was diagnosed with alopecia areata.  Plan: Protein / creatinine ratio, urine, CBC with Differential/Platelet, COMPLETE METABOLIC PANEL WITH GFR, ANA, Anti-DNA antibody, double-stranded, C3 and C4, Sedimentation rate, Beta-2 glycoprotein antibodies, Cardiolipin antibodies, IgG, IgM, IgA, Thyroglobulin antibody, Anti-TPO Ab (RDL).  I will check additional labs today.  Elevated LFTs-patient was found to have elevated LFTs with physical examination.  Patient states she drinks 5 cans of beer daily.  She also had a liver ultrasound which I reviewed.  Liver ultrasound showed hepatic asteatosis.  Alopecia areata -patient states that symptoms started at age 9 and gradually got worse.  In her 38s she started wearing a week.  She also lost hair on her arms and her legs.    Alcohol use -she drinks 5 cans of beer per day.  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  Vitamin D deficiency - On vitamin D 62952 units once a week  Family history of Hashimoto's thyroiditis-mother and sister  Former smoker - 1 pack/day for 15 years.  She quit smoking in 2017.  Orders: Orders Placed This Encounter  Procedures   Protein / creatinine ratio, urine   CBC with Differential/Platelet   COMPLETE METABOLIC PANEL WITH GFR   ANA   Anti-DNA antibody, double-stranded   C3 and C4   Sedimentation rate   Beta-2 glycoprotein antibodies   Cardiolipin antibodies, IgG, IgM, IgA   Thyroglobulin antibody   Anti-TPO Ab (RDL)   No orders of the defined types were placed in this encounter.    Follow-Up Instructions: Return for Positive ANA.   Pollyann Savoy, MD  Note - This record has been created using Animal nutritionist.  Chart creation errors have been sought, but may not always  have been located. Such creation errors do not reflect on  the standard of medical care.

## 2023-06-27 ENCOUNTER — Ambulatory Visit: Payer: 59 | Attending: Rheumatology | Admitting: Rheumatology

## 2023-06-27 ENCOUNTER — Encounter: Payer: Self-pay | Admitting: Rheumatology

## 2023-06-27 VITALS — BP 110/80 | HR 83 | Resp 14 | Ht 65.0 in | Wt 165.0 lb

## 2023-06-27 DIAGNOSIS — R768 Other specified abnormal immunological findings in serum: Secondary | ICD-10-CM | POA: Diagnosis not present

## 2023-06-27 DIAGNOSIS — F1090 Alcohol use, unspecified, uncomplicated: Secondary | ICD-10-CM

## 2023-06-27 DIAGNOSIS — R7989 Other specified abnormal findings of blood chemistry: Secondary | ICD-10-CM | POA: Diagnosis not present

## 2023-06-27 DIAGNOSIS — F988 Other specified behavioral and emotional disorders with onset usually occurring in childhood and adolescence: Secondary | ICD-10-CM

## 2023-06-27 DIAGNOSIS — E559 Vitamin D deficiency, unspecified: Secondary | ICD-10-CM

## 2023-06-27 DIAGNOSIS — Z87891 Personal history of nicotine dependence: Secondary | ICD-10-CM

## 2023-06-27 DIAGNOSIS — R7689 Other specified abnormal immunological findings in serum: Secondary | ICD-10-CM

## 2023-06-27 DIAGNOSIS — Z789 Other specified health status: Secondary | ICD-10-CM | POA: Diagnosis not present

## 2023-06-27 DIAGNOSIS — L639 Alopecia areata, unspecified: Secondary | ICD-10-CM

## 2023-06-27 NOTE — Addendum Note (Signed)
 Addended by: Ellen Henri on: 06/27/2023 11:32 AM   Modules accepted: Orders

## 2023-06-28 ENCOUNTER — Encounter: Payer: Self-pay | Admitting: Family Medicine

## 2023-06-28 ENCOUNTER — Ambulatory Visit (INDEPENDENT_AMBULATORY_CARE_PROVIDER_SITE_OTHER): Admitting: Family Medicine

## 2023-06-28 VITALS — BP 123/85 | HR 81 | Ht 65.0 in | Wt 167.8 lb

## 2023-06-28 DIAGNOSIS — Z789 Other specified health status: Secondary | ICD-10-CM

## 2023-06-28 DIAGNOSIS — Z975 Presence of (intrauterine) contraceptive device: Secondary | ICD-10-CM

## 2023-06-28 DIAGNOSIS — Z202 Contact with and (suspected) exposure to infections with a predominantly sexual mode of transmission: Secondary | ICD-10-CM

## 2023-06-28 DIAGNOSIS — F988 Other specified behavioral and emotional disorders with onset usually occurring in childhood and adolescence: Secondary | ICD-10-CM | POA: Diagnosis not present

## 2023-06-28 LAB — COMPLETE METABOLIC PANEL WITH GFR
AG Ratio: 1.4 (calc) (ref 1.0–2.5)
ALT: 86 U/L — ABNORMAL HIGH (ref 6–29)
AST: 81 U/L — ABNORMAL HIGH (ref 10–35)
Albumin: 4.3 g/dL (ref 3.6–5.1)
Alkaline phosphatase (APISO): 78 U/L (ref 31–125)
BUN: 13 mg/dL (ref 7–25)
CO2: 30 mmol/L (ref 20–32)
Calcium: 9.4 mg/dL (ref 8.6–10.2)
Chloride: 105 mmol/L (ref 98–110)
Creat: 0.81 mg/dL (ref 0.50–0.99)
Globulin: 3 g/dL (ref 1.9–3.7)
Glucose, Bld: 87 mg/dL (ref 65–99)
Potassium: 4.1 mmol/L (ref 3.5–5.3)
Sodium: 142 mmol/L (ref 135–146)
Total Bilirubin: 0.4 mg/dL (ref 0.2–1.2)
Total Protein: 7.3 g/dL (ref 6.1–8.1)
eGFR: 90 mL/min/{1.73_m2} (ref >=60)

## 2023-06-28 NOTE — Patient Instructions (Addendum)
 It was nice to see you today,  We addressed the following topics today: -I will send in your medications - Please try to reduce how much alcohol you use.  No alcohol is healthier than some alcohol but any reduction can help with preventing worsening of your liver function.  I would recommend trying to reduce either the number of days you consume alcohol to 2 or less or decrease your daily alcohol consumption to no more than 1 alcoholic drink for now.  Then we can see if you are able to decrease that further. - I will let you know the results of your lab testing and I receive it.  If you want to schedule a lab test for the blood test portion of it you can do so upfront.  Have a great day,  Frederic Jericho, MD

## 2023-06-28 NOTE — Progress Notes (Unsigned)
   Established Patient Office Visit  Subjective   Patient ID: Kristen Hanna, female    DOB: Jan 28, 1976  Age: 48 y.o. MRN: [id number]  No chief complaint on file.   HPI  ADHD-patient doing well currently with Vyvanse and in the morning and Adderall around 2 PM.  Recently transferred to a different department and will be working later in the evening.  Worried that her Adderall medication will wear off prior to finishing work.  Alcohol use/patient drinks 4 truly seltzers on average daily.  No longer drinking vodka.  Rheum - dsDNA positive.  March appt.    Alcohol use - 5 beers per day  [id number] serial number.   03/2028 exp.     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    Takes adderall around 2.  Later times. Vyvanse.  2 weeks.     Sister - diagnosed with hashimoto.  And mother.    The ASCVD Risk score (Arnett DK, et al., 2019) failed to calculate for the following reasons:   The valid HDL cholesterol range is 20 to 100 mg/dL  Health Maintenance Due  Topic Date Due   HIV Screening  Never done   COVID-19 Vaccine (3 - Pfizer risk series) 10/30/2019      Objective:     There were no vitals taken for this visit. {Vitals History (Optional):23777}  Physical Exam   No results found for any visits on 06/28/23.      Assessment & Plan:   There are no diagnoses linked to this encounter.   No follow-ups on file.    [name], MD

## 2023-06-29 ENCOUNTER — Ambulatory Visit: Payer: 59 | Admitting: Family Medicine

## 2023-06-29 DIAGNOSIS — Z975 Presence of (intrauterine) contraceptive device: Secondary | ICD-10-CM | POA: Insufficient documentation

## 2023-06-29 LAB — ANA: Anti Nuclear Antibody (ANA): POSITIVE — AB

## 2023-06-29 LAB — COMPREHENSIVE METABOLIC PANEL
AG Ratio: 1.4 (calc) (ref 1.0–2.5)
ALT: 86 U/L — ABNORMAL HIGH (ref 6–29)
AST: 81 U/L — ABNORMAL HIGH (ref 10–35)
Albumin: 4.3 g/dL (ref 3.6–5.1)
Alkaline phosphatase (APISO): 78 U/L (ref 31–125)
BUN: 13 mg/dL (ref 7–25)
CO2: 30 mmol/L (ref 20–32)
Calcium: 9.4 mg/dL (ref 8.6–10.2)
Chloride: 105 mmol/L (ref 98–110)
Creat: 0.81 mg/dL (ref 0.50–0.99)
Globulin: 3 g/dL (ref 1.9–3.7)
Glucose, Bld: 87 mg/dL (ref 65–99)
Potassium: 4.1 mmol/L (ref 3.5–5.3)
Sodium: 142 mmol/L (ref 135–146)
Total Bilirubin: 0.4 mg/dL (ref 0.2–1.2)
Total Protein: 7.3 g/dL (ref 6.1–8.1)
eGFR: 90 mL/min/{1.73_m2} (ref >=60)

## 2023-06-29 LAB — CARDIOLIPIN ANTIBODIES, IGG, IGM, IGA
Anticardiolipin IgA: 46.3 [APL'U]/mL — ABNORMAL HIGH (ref <20.0)
Anticardiolipin IgG: 19.3 [GPL'U]/mL (ref <20.0)
Anticardiolipin IgM: 8.8 [MPL'U]/mL (ref <20.0)

## 2023-06-29 LAB — BETA-2 GLYCOPROTEIN ANTIBODIES
Beta-2 Glyco 1 IgA: 33.8 U/mL — ABNORMAL HIGH (ref <20.0)
Beta-2 Glyco 1 IgM: 8.4 U/mL (ref <20.0)
Beta-2 Glyco I IgG: 22.2 U/mL — ABNORMAL HIGH (ref <20.0)

## 2023-06-29 LAB — CBC WITH DIFFERENTIAL/PLATELET
Absolute Lymphocytes: 1491 {cells}/uL (ref 850–3900)
Absolute Monocytes: 544 {cells}/uL (ref 200–950)
Basophils Absolute: 70 {cells}/uL (ref 0–200)
Basophils Relative: 1.1 %
Eosinophils Absolute: 147 {cells}/uL (ref 15–500)
Eosinophils Relative: 2.3 %
HCT: 41.5 % (ref 35.0–45.0)
Hemoglobin: 14 g/dL (ref 11.7–15.5)
MCH: 33.1 pg — ABNORMAL HIGH (ref 27.0–33.0)
MCHC: 33.7 g/dL (ref 32.0–36.0)
MCV: 98.1 fL (ref 80.0–100.0)
MPV: 10.8 fL (ref 7.5–12.5)
Monocytes Relative: 8.5 %
Neutro Abs: 4147 {cells}/uL (ref 1500–7800)
Neutrophils Relative %: 64.8 %
Platelets: 247 10*3/uL (ref 140–400)
RBC: 4.23 10*6/uL (ref 3.80–5.10)
RDW: 12 % (ref 11.0–15.0)
Total Lymphocyte: 23.3 %
WBC: 6.4 10*3/uL (ref 3.8–10.8)

## 2023-06-29 LAB — THYROID PEROXIDASE ANTIBODIES (TPO) (REFL): Thyroperoxidase Ab SerPl-aCnc: >900 [IU]/mL — ABNORMAL HIGH (ref <9)

## 2023-06-29 LAB — PROTEIN / CREATININE RATIO, URINE
Creatinine, Urine: 158 mg/dL (ref 20–275)
Protein/Creat Ratio: 51 mg/g{creat} (ref 24–184)
Protein/Creatinine Ratio: 0.051 mg/mg{creat} (ref 0.024–0.184)
Total Protein, Urine: 8 mg/dL (ref 5–24)

## 2023-06-29 LAB — ANTI-DNA ANTIBODY, DOUBLE-STRANDED: ds DNA Ab: 14 [IU]/mL — ABNORMAL HIGH

## 2023-06-29 LAB — SEDIMENTATION RATE: Sed Rate: 14 mm/h (ref 0–20)

## 2023-06-29 LAB — ANTI-NUCLEAR AB-TITER (ANA TITER): ANA Titer 1: 1:40 {titer} — ABNORMAL HIGH

## 2023-06-29 LAB — THYROGLOBULIN ANTIBODY: Thyroglobulin Ab: 2 [IU]/mL — ABNORMAL HIGH (ref <=1)

## 2023-06-29 LAB — C3 AND C4
C3 Complement: 145 mg/dL (ref 83–193)
C4 Complement: 25 mg/dL (ref 15–57)

## 2023-06-29 MED ORDER — LISDEXAMFETAMINE DIMESYLATE 40 MG PO CAPS: ORAL_CAPSULE | ORAL | 0 refills | Status: DC

## 2023-06-29 MED ORDER — AMPHETAMINE-DEXTROAMPHETAMINE 10 MG PO TABS: ORAL_TABLET | ORAL | 0 refills | Status: DC

## 2023-06-29 NOTE — Assessment & Plan Note (Signed)
 Encouraged patient to decrease her alcohol consumption which is currently about 4 truly hard seltzers per day.  Discussed how it is affecting her liver enzymes.  Patient agreeable to reducing alcohol intake.

## 2023-06-29 NOTE — Assessment & Plan Note (Signed)
 Doing well on current dosing.  He is switching roles in the next month and this will require longer work hours and she is concerned current regimen may not last the entire workday.

## 2023-06-30 NOTE — Progress Notes (Signed)
 CBC normal, CMP shows elevated LFTs, ANA positive, complements normal, sed rate normal, urine protein creatinine ratio normal, double-stranded DNA positive, beta-2 GP 1 low titer positive, anticardiolipin positive, double-stranded DNA positive, thyroid antibodies positive.  I will discuss results in detail at the follow-up visit.

## 2023-07-01 ENCOUNTER — Encounter: Payer: Self-pay | Admitting: Family Medicine

## 2023-07-01 LAB — GC/CHLAMYDIA PROBE AMP
Chlamydia trachomatis, NAA: NEGATIVE
Neisseria Gonorrhoeae by PCR: NEGATIVE

## 2023-07-11 NOTE — Progress Notes (Deleted)
 Office Visit Note  Patient: Kristen Hanna             Date of Birth: [birth year]           MRN: [id number]             PCP: [name], MD Referring: [name], MD Visit Date: 07/25/2023 Occupation: @GUAROCC @  Subjective:  No chief complaint on file.   History of Present Illness: Kristen Hanna is a 48 y.o. female ***     Activities of Daily Living:  Patient reports morning stiffness for *** {minute/hour:19697}.   Patient {ACTIONS;DENIES/REPORTS:21021675::"Denies"} nocturnal pain.  Difficulty dressing/grooming: {ACTIONS;DENIES/REPORTS:21021675::"Denies"} Difficulty climbing stairs: {ACTIONS;DENIES/REPORTS:21021675::"Denies"} Difficulty getting out of chair: {ACTIONS;DENIES/REPORTS:21021675::"Denies"} Difficulty using hands for taps, buttons, cutlery, and/or writing: {ACTIONS;DENIES/REPORTS:21021675::"Denies"}  No Rheumatology ROS completed.   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/2021   Encounter for medical examination to establish care 03/03/2021    Past Medical History:  Diagnosis Date   ADHD    Anxiety [id number]   Mass layoffs at work    Family History  Problem Relation Age of Onset   Diabetes Mother    Diabetes Father    ADD / ADHD Daughter    Arthritis Paternal Aunt    Past Surgical History:  Procedure Laterality Date   EYE SURGERY  2005   Lasic   NO PAST SURGERIES     Social History   Social History Narrative   Not on file   Immunization History  Administered Date(s) Administered   PFIZER(Purple Top)SARS-COV-2 Vaccination 09/11/2019, 10/02/2019   Tdap 07/20/2021      Objective: Vital Signs: There were no vitals taken for this visit.   Physical Exam   Musculoskeletal Exam: ***  CDAI Exam: CDAI Score: -- Patient Global: --; Provider Global: -- Swollen: --; Tender: -- Joint Exam 07/25/2023   No joint exam has been documented for this visit   There is currently no information documented on the homunculus. Go to the Rheumatology activity and complete the homunculus joint exam.  Investigation: No additional findings.  Imaging: No results found.  Recent Labs: Lab Results  Component Value Date   WBC 6.4 06/27/2023   HGB 14.0 06/27/2023   PLT 247 06/27/2023   NA 142 06/27/2023   NA 142 06/27/2023   K 4.1 06/27/2023   K 4.1 06/27/2023   CL 105 06/27/2023   CL 105 06/27/2023   CO2 30 06/27/2023   CO2 30 06/27/2023   GLUCOSE 87 06/27/2023   GLUCOSE 87 06/27/2023   BUN 13 06/27/2023   BUN 13 06/27/2023   CREATININE 0.81 06/27/2023   CREATININE 0.81 06/27/2023   BILITOT 0.4 06/27/2023   BILITOT 0.4 06/27/2023   ALKPHOS 92 03/31/2023   AST 81 (H) 06/27/2023   AST 81 (H) 06/27/2023   ALT 86 (H) 06/27/2023   ALT 86 (H) 06/27/2023   PROT 7.3 06/27/2023   PROT 7.3 06/27/2023   ALBUMIN 4.4 03/31/2023   CALCIUM 9.4 06/27/2023   CALCIUM 9.4 06/27/2023   June 27, 2023 urine protein creatinine ratio normal, ANA 1: 40 NS, dsDNA 14, C3-C4 normal, anticardiolipin IgA 46.3, beta-2 GP 1 IgA 33.8, beta-2  GP 1 IgG 22.2, thyroglobulin antibody positive, TPO antibody positive  Speciality Comments: No specialty comments available.  Procedures:  No procedures performed Allergies: Patient has no known allergies.   Assessment / Plan:     Visit Diagnoses: Positive ANA (antinuclear antibody) - ANA low titer positive, double-stranded DNA positive, anticardiolipin and beta-2 GP 1 positive.  Repeat labs in 3 months.  Alopecia areata  Elevated LFTs - Patient drinks 5 cans of beer daily.  Ultrasound showed hepatic steatosis  Alcohol  use  Attention deficit disorder (ADD) without hyperactivity  Vitamin D deficiency  Former smoker - 1 pack/day for 15 years.  She quit smoking in 2017.  Orders: No orders of the defined types were placed in this encounter.  No orders of the defined types were placed in this encounter.  Family history of Hashimoto's thyroiditis in mother and sister   Face-to-face time spent with patient was *** minutes. Greater than 50% of time was spent in counseling and coordination of care.  Follow-Up Instructions: No follow-ups on file.   Pollyann Savoy, MD  Note - This record has been created using Animal nutritionist.  Chart creation errors have been sought, but may not always  have been located. Such creation errors do not reflect on  the standard of medical care.

## 2023-07-25 ENCOUNTER — Ambulatory Visit: Payer: 59 | Admitting: Rheumatology

## 2023-07-25 DIAGNOSIS — R7989 Other specified abnormal findings of blood chemistry: Secondary | ICD-10-CM

## 2023-07-25 DIAGNOSIS — R768 Other specified abnormal immunological findings in serum: Secondary | ICD-10-CM

## 2023-07-25 DIAGNOSIS — F988 Other specified behavioral and emotional disorders with onset usually occurring in childhood and adolescence: Secondary | ICD-10-CM

## 2023-07-25 DIAGNOSIS — F1090 Alcohol use, unspecified, uncomplicated: Secondary | ICD-10-CM

## 2023-07-25 DIAGNOSIS — E559 Vitamin D deficiency, unspecified: Secondary | ICD-10-CM

## 2023-07-25 DIAGNOSIS — L639 Alopecia areata, unspecified: Secondary | ICD-10-CM

## 2023-07-25 DIAGNOSIS — Z87891 Personal history of nicotine dependence: Secondary | ICD-10-CM

## 2023-08-03 NOTE — Progress Notes (Signed)
 Office Visit Note  Patient: Kristen Hanna             Date of Birth: April 04, 1976           MRN: [id number]             PCP: Laneta Pintos, MD Referring: Laneta Pintos, MD Visit Date: 08/16/2023 Occupation: @GUAROCC @  Subjective:  Abnormal labs  History of Present Illness: Kristen Hanna is a 49 y.o. female with positive ANA, double-stranded DNA, anticardiolipin and beta-2  GP 1 antibodies.  She returns today after her last visit in March.  She denies any history of oral ulcers, nasal ulcers, malar rash, photosensitivity, Raynaud's, inflammatory arthritis or lymphadenopathy.  She has been taking vitamin D  on a regular basis.  She states she has back on the alcohol intake.  She will be following up with her PCP regarding the elevated LFTs.    Activities of Daily Living:  Patient reports morning stiffness 0 minute  .   Patient Denies nocturnal pain.  Difficulty dressing/grooming: Denies Difficulty climbing stairs: Denies Difficulty getting out of chair: Denies Difficulty using hands for taps, buttons, cutlery, and/or writing: Denies  Review of Systems  Constitutional:  Negative for fatigue.  HENT:  Negative for mouth sores and mouth dryness.   Eyes:  Negative for dryness.  Respiratory:  Negative for shortness of breath.   Cardiovascular:  Negative for chest pain and palpitations.  Gastrointestinal:  Negative for blood in stool, constipation and diarrhea.  Endocrine: Negative for increased urination.  Genitourinary:  Negative for involuntary urination.  Musculoskeletal:  Negative for joint pain, gait problem, joint pain, joint swelling, myalgias, muscle weakness, morning stiffness, muscle tenderness and myalgias.  Skin:  Negative for color change, rash, hair loss and sensitivity to sunlight.  Allergic/Immunologic: Negative for susceptible to infections.  Neurological:  Negative for dizziness and headaches.  Hematological:  Negative for swollen glands.   Psychiatric/Behavioral:  Negative for depressed mood and sleep disturbance. The patient is not nervous/anxious.     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   Encounter for medical examination to establish care 03/03/2021    Past Medical History:  Diagnosis Date   ADHD    Anxiety [id number]   Mass layoffs at work    Family History  Problem Relation Age of Onset   Diabetes Mother    Diabetes Father    ADD / ADHD Daughter    Arthritis Paternal Aunt    Past Surgical History:  Procedure Laterality Date   EYE SURGERY  2005   Lasic   NO PAST SURGERIES     Social History   Social History Narrative   Not on file   Immunization History  Administered Date(s) Administered   PFIZER(Purple Top)SARS-COV-2 Vaccination 09/11/2019, 10/02/2019   Tdap 07/20/2021     Objective: Vital Signs: BP 116/79 (BP Location: Left Arm, Patient Position: Sitting, Cuff Size: Normal)   Pulse 82   Resp 16   Ht 5\' 5"  (1.651 m)   Wt 179 lb (81.2 kg)   BMI 29.79 kg/m    Physical Exam Vitals and nursing note reviewed.  Constitutional:      Appearance:  She is well-developed.  HENT:     Head: Normocephalic and atraumatic.  Eyes:     Conjunctiva/sclera: Conjunctivae normal.  Cardiovascular:     Rate and Rhythm: Normal rate and regular rhythm.     Heart sounds: Normal heart sounds.  Pulmonary:     Effort: Pulmonary effort is normal.     Breath sounds: Normal breath sounds.  Abdominal:     General: Bowel sounds are normal.     Palpations: Abdomen is soft.  Musculoskeletal:     Cervical back: Normal range of motion.  Lymphadenopathy:      Cervical: No cervical adenopathy.  Skin:    General: Skin is warm and dry.     Capillary Refill: Capillary refill takes less than 2 seconds.  Neurological:     Mental Status: She is alert and oriented to person, place, and time.  Psychiatric:        Behavior: Behavior normal.      Musculoskeletal Exam: Cervical, thoracic and lumbar spine with good range of motion.  Shoulders, elbows, wrist joints, MCPs PIPs and DIPs Juengel range of motion with no synovitis.  Hip joints, knee joints, ankles, MTPs and PIPs with good range of motion with no synovitis.  CDAI Exam: CDAI Score: -- Patient Global: --; Provider Global: -- Swollen: --; Tender: -- Joint Exam 08/16/2023   No joint exam has been documented for this visit   There is currently no information documented on the homunculus. Go to the Rheumatology activity and complete the homunculus joint exam.  Investigation: No additional findings.  Imaging: No results found.  Recent Labs: Lab Results  Component Value Date   WBC 6.4 06/27/2023   HGB 14.0 06/27/2023   PLT 247 06/27/2023   NA 142 06/27/2023   NA 142 06/27/2023   K 4.1 06/27/2023   K 4.1 06/27/2023   CL 105 06/27/2023   CL 105 06/27/2023   CO2 30 06/27/2023   CO2 30 06/27/2023   GLUCOSE 87 06/27/2023   GLUCOSE 87 06/27/2023   BUN 13 06/27/2023   BUN 13 06/27/2023   CREATININE 0.81 06/27/2023   CREATININE 0.81 06/27/2023   BILITOT 0.4 06/27/2023   BILITOT 0.4 06/27/2023   ALKPHOS 92 03/31/2023   AST 81 (H) 06/27/2023   AST 81 (H) 06/27/2023   ALT 86 (H) 06/27/2023   ALT 86 (H) 06/27/2023   PROT 7.3 06/27/2023   PROT 7.3 06/27/2023   ALBUMIN 4.4 03/31/2023   CALCIUM 9.4 06/27/2023   CALCIUM 9.4 06/27/2023   June 27, 2023 ANA 1: 40 NS, dsDNA 14, C3-C4 normal, sed rate 14, anticardiolipin IgA 46.3, beta-2  GP 1 IgA 33.8, beta-2  GP 1 IgG 22.2, TPO antibody> 900, thyroglobulin antibody 2  January 05, 2023 ANA positive, dsDNA 16, (RNP, Felipe Horton, SCL 70, SSA,  SSB, chromatin, Jo 1, centromere negative), iron studies normal, ceruloplasmin normal, smooth muscle antibody negative   March 31, 2023 AST 50, ALT 42  Speciality Comments: No specialty comments available.  Procedures:  No procedures performed Allergies: Patient has no known allergies.   Assessment / Plan:     Visit Diagnoses: Positive ANA (antinuclear antibody) - June 27, 2023 ANA 1: 40 NS, dsDNA 14, C3-C4 normal, sed rate 14, anticardiolipin IgA 46.3, beta-2  GP 1 IgA 33.8, beta-2  GP 1 IgG 22.2, TPO antibody> 900, thyroglobulin antibody 2 -lab results were discussed with patient at length.  She denies any history of oral ulcers, nasal ulcers, malar rash, photosensitivity, Raynaud's, lymphadenopathy.  There is no history  of inflammatory arthritis.  There is no history of previous venous or arterial thrombosis.  Plan: Anti-DNA antibody, double-stranded, C3 and C4  Anticardiolipin antibody positive -plan repeat labs in 3 months.  Increase water intake was discussed.  Plan: Beta-2  glycoprotein antibodies, Cardiolipin antibodies, IgG, IgM, IgA, Lupus Anticoagulant Eval w/Reflex  Elevated LFTs - She drinks 5 cans of beer daily.  Liver ultrasound showed hepatic steatosis.  Patient states she is trying to cut back on her alcohol intake.  She plans to follow-up LFTs with her PCP.  Alcohol use - 5 cans of beer per day  Alopecia areata - Started at age 75.  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  Vitamin D  deficiency - On vitamin D  5000 units daily. -She is on vitamin D .  She had low vitamin D  in the past.  Plan: VITAMIN D  25 Hydroxy (Vit-D Deficiency, Fractures) in 3 months  Former smoker - 1 pack/day for 15 years.  She quit smoking 2017.  Other fatigue - Plan: TSH  Family history of Hashimoto's thyroiditis-mother and sister.  Patient has positive TPO and thyroglobulin antibodies.  TSH was normal on September 24, 2022.  Will recheck TSH with her next  labs.  Orders: Orders Placed This Encounter  Procedures   Anti-DNA antibody, double-stranded   Beta-2  glycoprotein antibodies   Cardiolipin antibodies, IgG, IgM, IgA   Lupus Anticoagulant Eval w/Reflex   C3 and C4   TSH   VITAMIN D  25 Hydroxy (Vit-D Deficiency, Fractures)   No orders of the defined types were placed in this encounter.    Follow-Up Instructions: Return in about 4 months (around 12/17/2023) for Positive ANA.   Nicholas Bari, MD  Note - This record has been created using Animal nutritionist.  Chart creation errors have been sought, but may not always  have been located. Such creation errors do not reflect on  the standard of medical care.

## 2023-08-14 IMAGING — US US BREAST*L* LIMITED INC AXILLA
1 series · 2 of 2 positions shown · non-contrast
Comparison: Previous exam(s).

CLINICAL DATA: Screening recall for possible bilateral breast
masses.



[Series 1: us breast*left* limited inc axilla · 0.08mm/px · 2 of 2 slices shown]
[im 1/2]
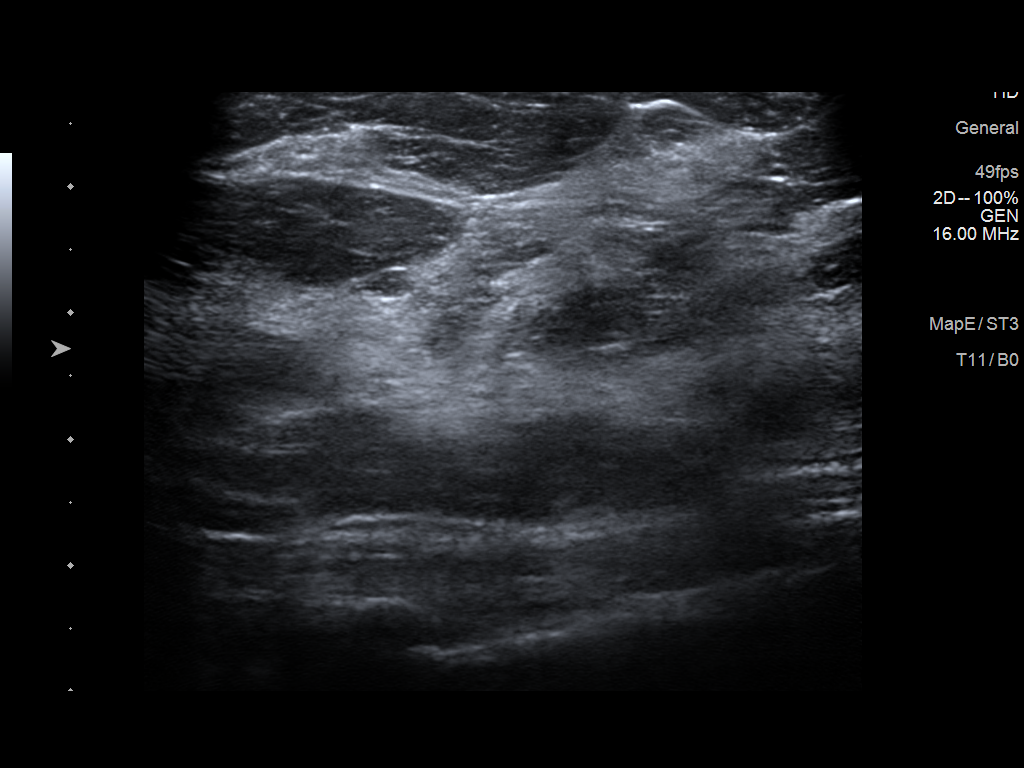
[im 2/2]
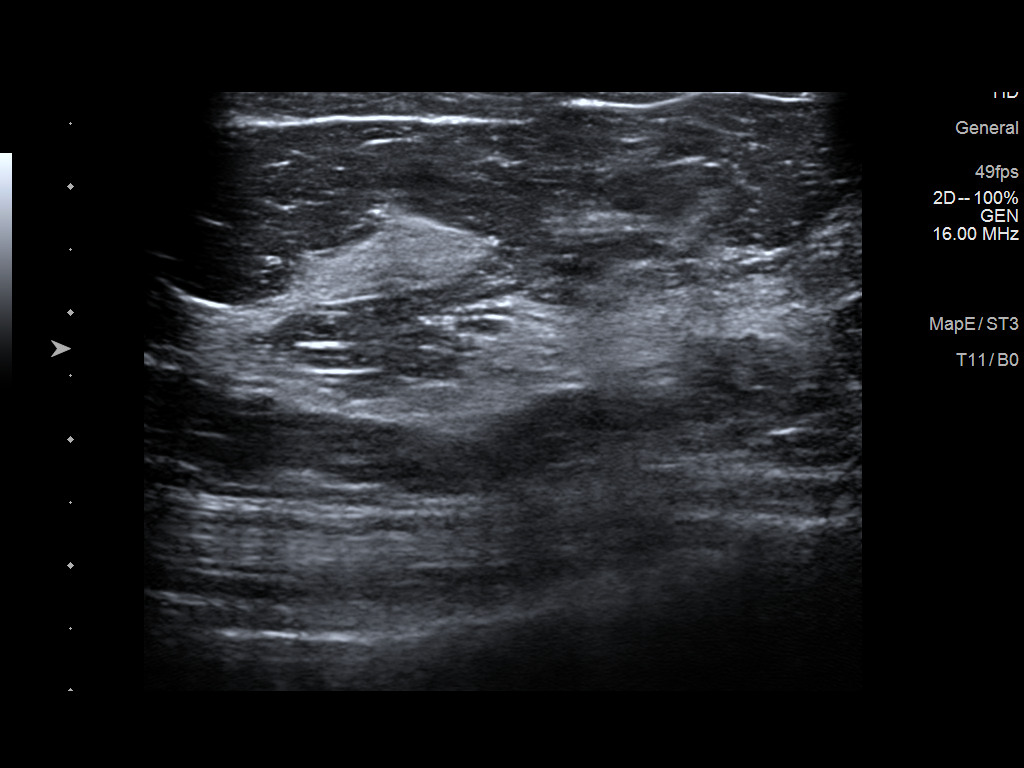

[2 of 2 positions shown; findings below may reference images not displayed]

ACR Breast Density Category c: The breast tissue is heterogeneously
dense, which may obscure small masses.
FINDINGS: Spot compression tomosynthesis images through the lateral posterior
aspect of the right breast demonstrates an 8 mm obscured oval mass.
Spot compression tomosynthesis images through the superior left
breast demonstrates a possible small obscured mass/masses, however
due to the density of the patient's breast tissue further
characterization with ultrasound will be performed.

Ultrasound of the right breast at 9 o'clock, 5 cm from the nipple
demonstrates a circumscribed oval hypoechoic mass measuring 7 x 6 x
6 mm.

Ultrasound of the superior left breast demonstrates no suspicious
masses.
IMPRESSION: 1. There is a likely benign 7 mm mass in the right breast at 9
o'clock. This may represent a complicated cyst or a benign mass such
as a fibroadenoma.

2.  No persistent abnormalities are found in the left breast.

RECOMMENDATION:
Six-month follow-up right breast ultrasound.

I have discussed the findings and recommendations with the patient.
If applicable, a reminder letter will be sent to the patient
regarding the next appointment.

BI-RADS CATEGORY  3: Probably benign.

## 2023-08-14 IMAGING — MG DIGITAL DIAGNOSTIC BILAT W/ TOMO W/ CAD
8 series · 8 of 24 positions shown · non-contrast
Comparison: Previous exam(s).

CLINICAL DATA: Screening recall for possible bilateral breast
masses.



[L CC synth-2D]
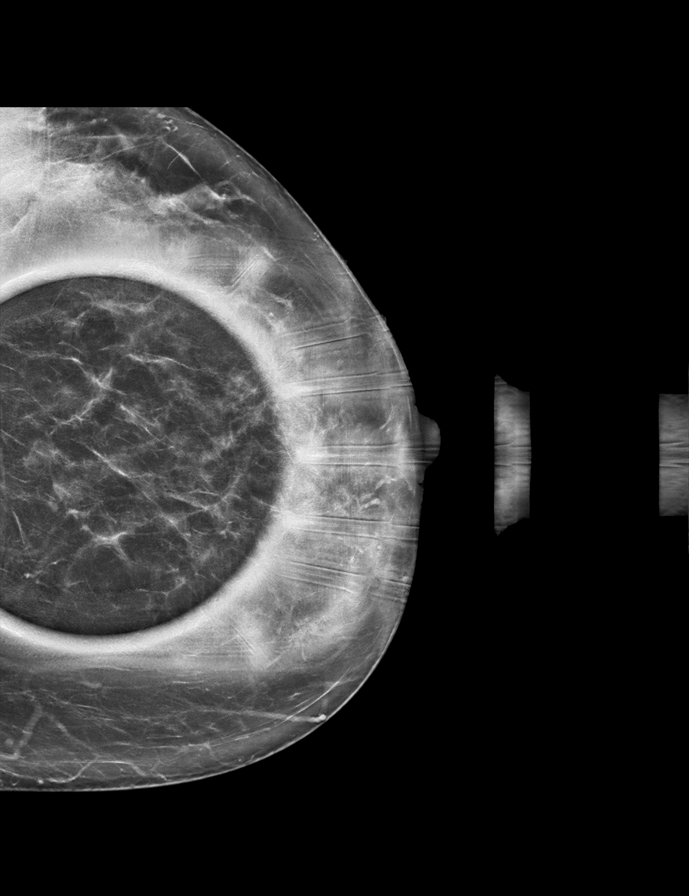

[R MLO synth-2D]
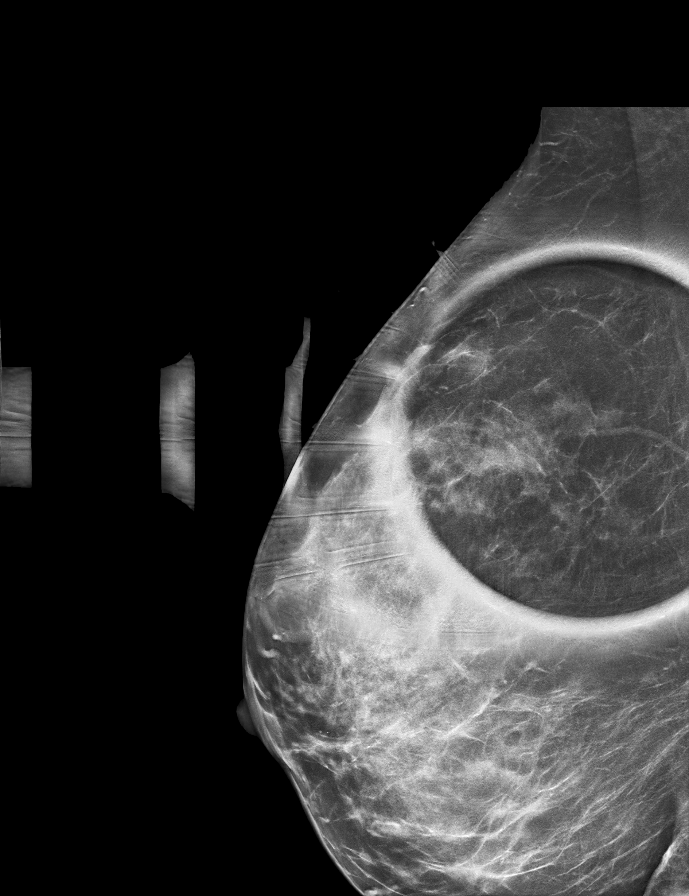

[L MLO synth-2D]
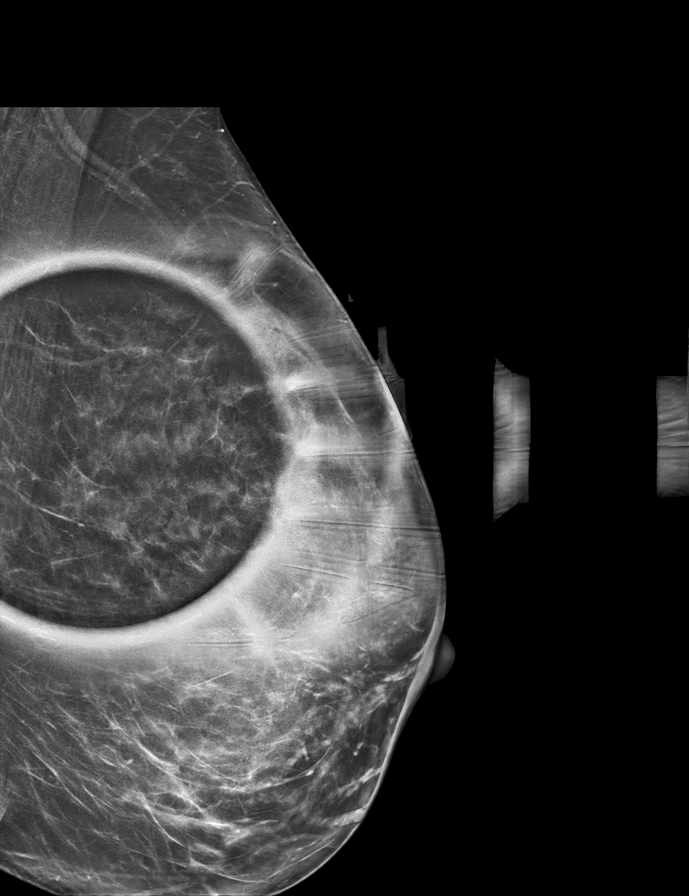

[R CC synth-2D]
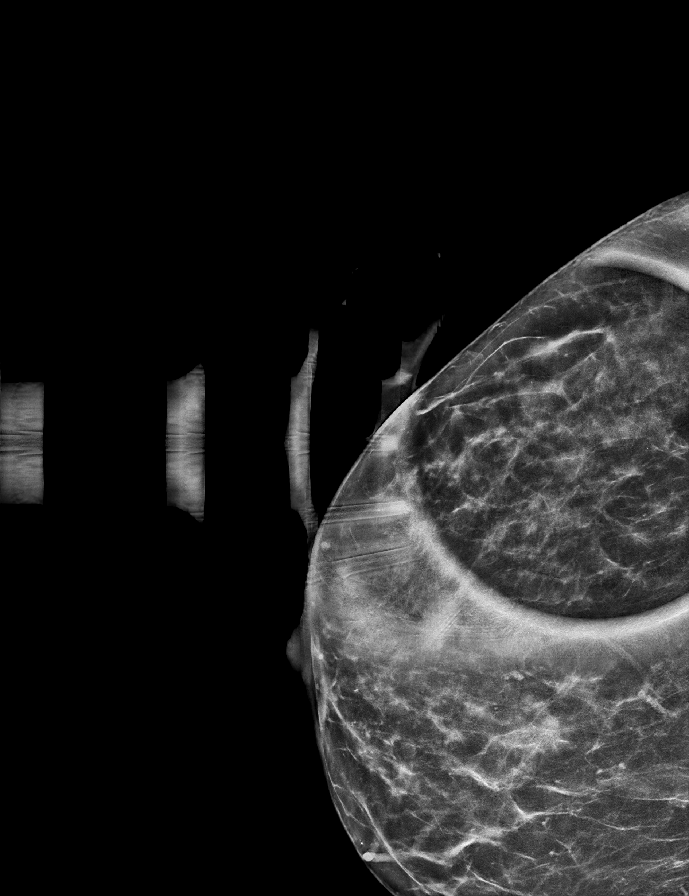

[R MLO tomo · tomo slice 35/69.0]
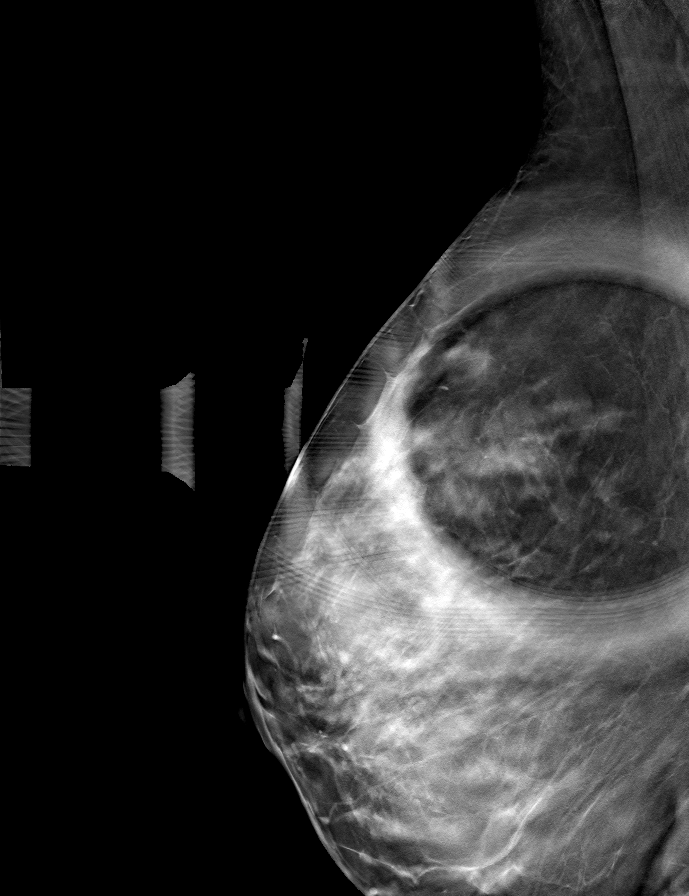

[R CC tomo · tomo slice 32/63.0]
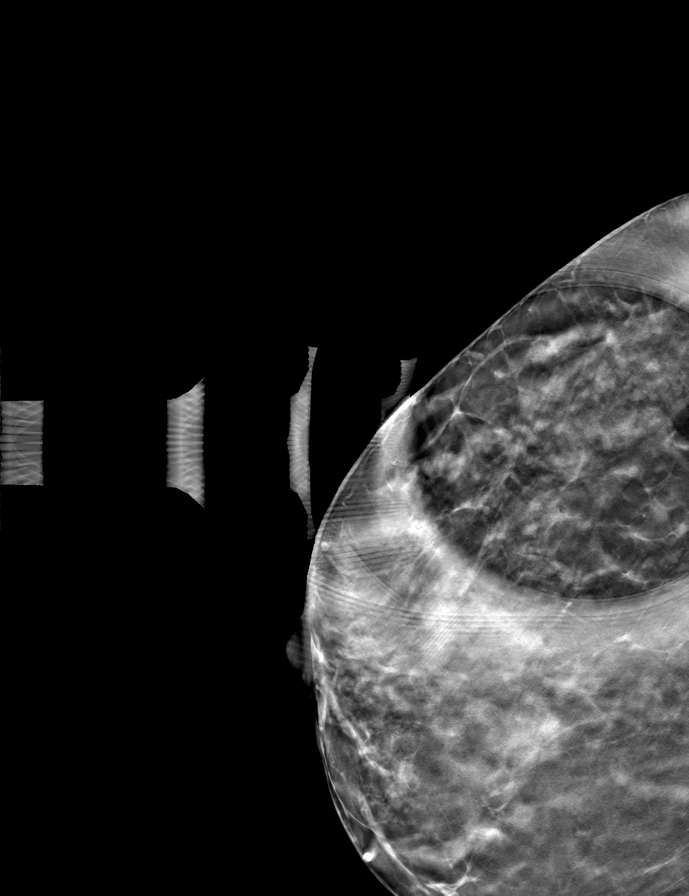

[L CC tomo · tomo slice 37/73.0]
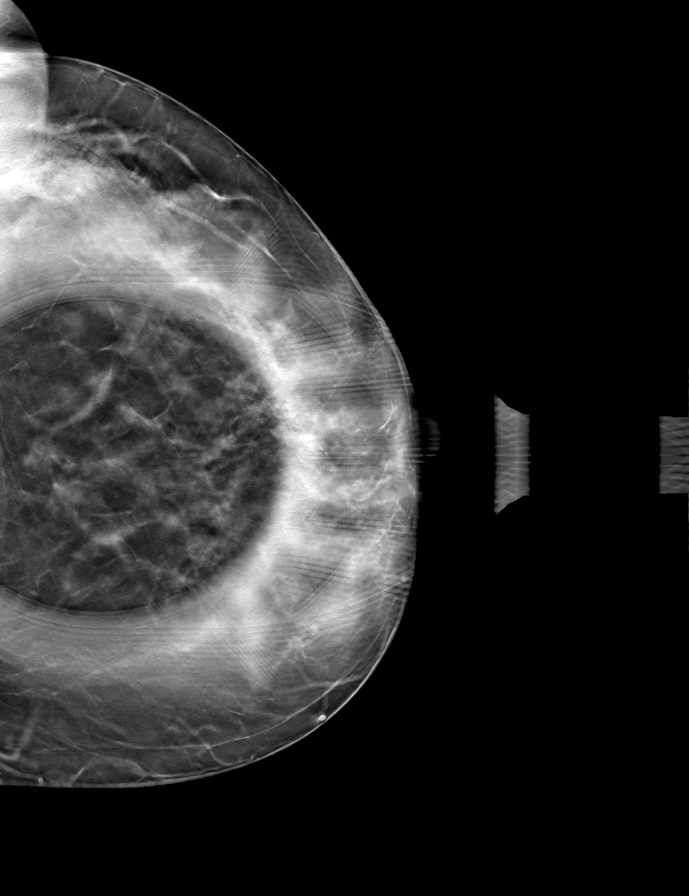

[L MLO tomo · tomo slice 35/70.0]
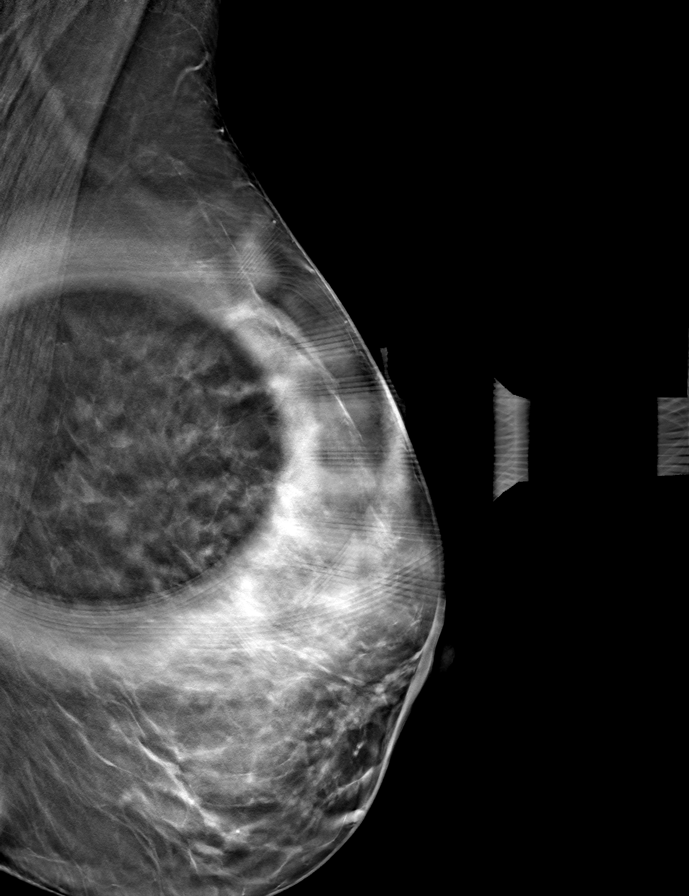

[8 of 24 positions shown; findings below may reference images not displayed]

ACR Breast Density Category c: The breast tissue is heterogeneously
dense, which may obscure small masses.
FINDINGS: Spot compression tomosynthesis images through the lateral posterior
aspect of the right breast demonstrates an 8 mm obscured oval mass.
Spot compression tomosynthesis images through the superior left
breast demonstrates a possible small obscured mass/masses, however
due to the density of the patient's breast tissue further
characterization with ultrasound will be performed.

Ultrasound of the right breast at 9 o'clock, 5 cm from the nipple
demonstrates a circumscribed oval hypoechoic mass measuring 7 x 6 x
6 mm.

Ultrasound of the superior left breast demonstrates no suspicious
masses.
IMPRESSION: 1. There is a likely benign 7 mm mass in the right breast at 9
o'clock. This may represent a complicated cyst or a benign mass such
as a fibroadenoma.

2.  No persistent abnormalities are found in the left breast.

RECOMMENDATION:
Six-month follow-up right breast ultrasound.

I have discussed the findings and recommendations with the patient.
If applicable, a reminder letter will be sent to the patient
regarding the next appointment.

BI-RADS CATEGORY  3: Probably benign.

## 2023-08-16 ENCOUNTER — Ambulatory Visit: Attending: Rheumatology | Admitting: Rheumatology

## 2023-08-16 ENCOUNTER — Encounter: Payer: Self-pay | Admitting: Rheumatology

## 2023-08-16 VITALS — BP 116/79 | HR 82 | Resp 16 | Ht 65.0 in | Wt 179.0 lb

## 2023-08-16 DIAGNOSIS — L639 Alopecia areata, unspecified: Secondary | ICD-10-CM | POA: Diagnosis not present

## 2023-08-16 DIAGNOSIS — R7989 Other specified abnormal findings of blood chemistry: Secondary | ICD-10-CM | POA: Diagnosis not present

## 2023-08-16 DIAGNOSIS — R768 Other specified abnormal immunological findings in serum: Secondary | ICD-10-CM

## 2023-08-16 DIAGNOSIS — F1090 Alcohol use, unspecified, uncomplicated: Secondary | ICD-10-CM

## 2023-08-16 DIAGNOSIS — R760 Raised antibody titer: Secondary | ICD-10-CM

## 2023-08-16 DIAGNOSIS — Z87891 Personal history of nicotine dependence: Secondary | ICD-10-CM

## 2023-08-16 DIAGNOSIS — R5383 Other fatigue: Secondary | ICD-10-CM

## 2023-08-16 DIAGNOSIS — F988 Other specified behavioral and emotional disorders with onset usually occurring in childhood and adolescence: Secondary | ICD-10-CM

## 2023-08-16 DIAGNOSIS — E559 Vitamin D deficiency, unspecified: Secondary | ICD-10-CM

## 2023-08-16 NOTE — Patient Instructions (Signed)
 Please get labs done 10 days prior to your appointment.  You may call our office to get the labs released.

## 2023-09-29 ENCOUNTER — Ambulatory Visit: Admitting: Family Medicine

## 2023-09-29 VITALS — BP 126/85 | HR 94 | Ht 65.0 in | Wt 158.4 lb

## 2023-09-29 DIAGNOSIS — F1090 Alcohol use, unspecified, uncomplicated: Secondary | ICD-10-CM | POA: Diagnosis not present

## 2023-09-29 DIAGNOSIS — F988 Other specified behavioral and emotional disorders with onset usually occurring in childhood and adolescence: Secondary | ICD-10-CM | POA: Diagnosis not present

## 2023-09-29 DIAGNOSIS — R945 Abnormal results of liver function studies: Secondary | ICD-10-CM

## 2023-09-29 MED ORDER — LISDEXAMFETAMINE DIMESYLATE 50 MG PO CAPS: 50.0000 mg | ORAL_CAPSULE | Freq: Every day | ORAL | 0 refills | Status: DC

## 2023-09-29 MED ORDER — AMPHETAMINE-DEXTROAMPHETAMINE 10 MG PO TABS
ORAL_TABLET | ORAL | 0 refills | Status: DC
Start: 2023-12-08 — End: 2024-01-05

## 2023-09-29 MED ORDER — AMPHETAMINE-DEXTROAMPHETAMINE 10 MG PO TABS
ORAL_TABLET | ORAL | 0 refills | Status: DC
Start: 2023-10-08 — End: 2024-01-05

## 2023-09-29 MED ORDER — AMPHETAMINE-DEXTROAMPHETAMINE 10 MG PO TABS: ORAL_TABLET | ORAL | 0 refills | Status: DC

## 2023-09-29 NOTE — Patient Instructions (Addendum)
 It was nice to see you today,  We addressed the following topics today: -I have increased your Vyvanse  to 50 mg.  The new dose will be available to pick up on June 28. - Your next visit will be your physical and we will get labs a week prior to that visit.  Have a great day,  Etha Henle, MD

## 2023-09-29 NOTE — Assessment & Plan Note (Signed)
 Reduced to 2-3 cans of seltzer daily, only after 6pm.    - Continue efforts to reduce consumption    - Monitor

## 2023-09-29 NOTE — Assessment & Plan Note (Signed)
 Current treatment with Vyvanse  40mg  daily and Adderall 10mg  in afternoon. Reports Vyvanse  effectiveness decreasing in morning, Adderall still effective.    - Increase Vyvanse  to 50mg  daily    - Continue Adderall 10mg  in afternoon    - Follow up in 3 months with liver function tests

## 2023-09-29 NOTE — Progress Notes (Unsigned)
   Established Patient Office Visit  Subjective   Patient ID: Kristen Hanna, female    DOB: 1975-12-08  Age: 48 y.o. MRN: [id number]  Chief Complaint  Patient presents with   Medical Management of Chronic Issues    HPI  Subjective - Reports Vyvanse  40mg  not working as well as before, not as strong in the morning - Adderall 10mg  in afternoon still effective as a boost when Vyvanse  wears off - Cut finger remains numb, especially noticeable when typing and using mouse - Following up with specialist who plans additional labs - Taking weekly vitamin D  supplement - Alcohol use reduced to 2-3 cans of seltzer daily, only after 6pm  Medications Vyvanse  40mg  daily, Adderall 10mg  daily in afternoon, vitamin D  weekly  PMH, PSH, FH, Social Hx Social: Alcohol use 2-3 cans of seltzer daily, only after 6pm  ROS Neurological: Numbness in finger after laceration   The ASCVD Risk score (Arnett DK, et al., 2019) failed to calculate for the following reasons:   The valid HDL cholesterol range is 20 to 100 mg/dL  Health Maintenance Due  Topic Date Due   HIV Screening  Never done   Hepatitis B Vaccines (1 of 3 - 19+ 3-dose series) Never done   COVID-19 Vaccine (3 - Pfizer risk series) 10/30/2019      Objective:     BP 126/85   Pulse 94   Ht 5' 5 (1.651 m)   Wt 158 lb 6.4 oz (71.8 kg)   SpO2 99%   BMI 26.36 kg/m    Physical Exam Gen: alert, oriented Pulm: no resp distress Psych: pleasant affect.    No results found for any visits on 09/29/23.      Assessment & Plan:   Attention deficit disorder (ADD) without hyperactivity Assessment & Plan:    Current treatment with Vyvanse  40mg  daily and Adderall 10mg  in afternoon. Reports Vyvanse  effectiveness decreasing in morning, Adderall still effective.    - Increase Vyvanse  to 50mg  daily    - Continue Adderall 10mg  in afternoon    - Follow up in 3 months with liver function tests  Orders: -      Amphetamine -Dextroamphetamine ; TAKE 1/2 TO 1 TABLET BY MOUTH DAILY AS NEEDED.  Dispense: 30 tablet; Refill: 0 -     Amphetamine -Dextroamphetamine ; TAKE 1/2 TO 1 TABLET BY MOUTH DAILY AS NEEDED.  Dispense: 30 tablet; Refill: 0 -     Amphetamine -Dextroamphetamine ; TAKE 1/2 TO 1 TABLET BY MOUTH DAILY AS NEEDED.  Dispense: 30 tablet; Refill: 0  Abnormal liver function -     Comprehensive metabolic panel with GFR; Future -     Lipid panel; Future  Alcohol use Assessment & Plan:   Reduced to 2-3 cans of seltzer daily, only after 6pm.    - Continue efforts to reduce consumption    - Monitor  Orders: -     CBC with Differential/Platelet; Future -     Hemoglobin A1c; Future  Other orders -     Lisdexamfetamine Dimesylate ; Take 1 capsule (50 mg total) by mouth daily.  Dispense: 30 capsule; Refill: 0 -     Lisdexamfetamine Dimesylate ; Take 1 capsule (50 mg total) by mouth daily.  Dispense: 30 capsule; Refill: 0 -     Lisdexamfetamine Dimesylate ; Take 1 capsule (50 mg total) by mouth daily.  Dispense: 30 capsule; Refill: 0     Return in about 3 months (around 12/30/2023) for physical.    [name], MD

## 2023-11-12 ENCOUNTER — Other Ambulatory Visit: Payer: Self-pay | Admitting: Nurse Practitioner

## 2023-11-12 DIAGNOSIS — E559 Vitamin D deficiency, unspecified: Secondary | ICD-10-CM

## 2023-11-14 ENCOUNTER — Other Ambulatory Visit: Payer: Self-pay | Admitting: Family Medicine

## 2023-11-14 DIAGNOSIS — E559 Vitamin D deficiency, unspecified: Secondary | ICD-10-CM

## 2023-12-08 NOTE — Progress Notes (Signed)
 Office Visit Note  Patient: Kristen Hanna             Date of Birth: 1975/05/24           MRN: [id number]             PCP: Chandra Toribio POUR, MD Referring: Chandra Toribio POUR, MD Visit Date: 12/22/2023 Occupation: @GUAROCC @  Subjective:  Abnormal labs  History of Present Illness: Kristen Hanna is a 48 y.o. female with positive ANA, positive anticardiolipin and beta-2  GP 1 antibodies.  She states that she remains asymptomatic without any fatigue, oral ulcers, nasal ulcers, malar rash, photosensitivity, lymphadenopathy or inflammatory arthritis.  She reports mild Raynaud's symptoms during the winter months.  She states she had more prominent Raynaud's symptoms when she was living up [place] which has been proved.  Patient states she has appointment coming up with the gastroenterologist regarding elevated LFTs.    Activities of Daily Living:  Patient reports morning stiffness for 0 minute.   Patient Denies nocturnal pain.  Difficulty dressing/grooming: Denies Difficulty climbing stairs: Denies Difficulty getting out of chair: Denies Difficulty using hands for taps, buttons, cutlery, and/or writing: Denies  Review of Systems  Constitutional:  Negative for fatigue.  HENT:  Negative for mouth sores and mouth dryness.   Eyes:  Negative for dryness.  Respiratory:  Negative for shortness of breath.   Cardiovascular:  Negative for chest pain and palpitations.  Gastrointestinal:  Negative for blood in stool, constipation and diarrhea.  Endocrine: Negative for increased urination.  Genitourinary:  Negative for involuntary urination.  Musculoskeletal:  Negative for joint pain, gait problem, joint pain, joint swelling, myalgias, muscle weakness, morning stiffness, muscle tenderness and myalgias.  Skin:  Positive for color change. Negative for rash, hair loss and sensitivity to sunlight.  Allergic/Immunologic: Negative for susceptible to infections.  Neurological:  Negative for dizziness and  headaches.  Hematological:  Negative for swollen glands.  Psychiatric/Behavioral:  Negative for depressed mood and sleep disturbance. The patient is not nervous/anxious.     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   Encounter for medical examination to establish care 03/03/2021    Past Medical History:  Diagnosis Date   ADHD    Anxiety [id number]   Mass layoffs at work    Family History  Problem Relation Age of Onset   Diabetes Mother    Diabetes Father    ADD / ADHD Daughter    Arthritis Paternal Aunt    Past Surgical History:  Procedure Laterality Date   EYE SURGERY  2005   Lasic   NO PAST SURGERIES     Social History   Social History Narrative   Not on file   Immunization History  Administered Date(s) Administered   PFIZER(Purple Top)SARS-COV-2 Vaccination 09/11/2019, 10/02/2019   Tdap 07/20/2021     Objective: Vital Signs: BP 126/87   Pulse 99   Temp 98.1 F (36.7 C)   Resp 14   Ht 5' 5 (1.651 m)   Wt 164 lb 6.4 oz (74.6 kg)   BMI 27.36 kg/m    Physical Exam Vitals and nursing note reviewed.  Constitutional:  Appearance: She is well-developed.  HENT:     Head: Normocephalic and atraumatic.  Eyes:     Conjunctiva/sclera: Conjunctivae normal.  Cardiovascular:     Rate and Rhythm: Normal rate and regular rhythm.     Heart sounds: Normal heart sounds.  Pulmonary:     Effort: Pulmonary effort is normal.     Breath sounds: Normal breath sounds.  Abdominal:     General: Bowel sounds are normal.     Palpations: Abdomen is soft.  Musculoskeletal:     Cervical back: Normal range of motion.   Lymphadenopathy:     Cervical: No cervical adenopathy.  Skin:    General: Skin is warm and dry.     Capillary Refill: Capillary refill takes 2 to 3 seconds.     Comments: No nailbed capillary changes, sclerodactyly or telangiectasia were noted.  Neurological:     Mental Status: She is alert and oriented to person, place, and time.  Psychiatric:        Behavior: Behavior normal.      Musculoskeletal Exam: Cervical, thoracic and lumbar spine were in good range of motion.  There was no SI joint tenderness.  Shoulder joints, elbow joints, wrist joints, MCPs, PIPs and DIPs were in good range of motion with no synovitis.  Hip joints and knee joints were in good range of motion without any warmth swelling or effusion.  There was no tenderness over ankles or MTPs.   CDAI Exam: CDAI Score: -- Patient Global: --; Provider Global: -- Swollen: --; Tender: -- Joint Exam 12/22/2023   No joint exam has been documented for this visit   There is currently no information documented on the homunculus. Go to the Rheumatology activity and complete the homunculus joint exam.  Investigation: No additional findings.  Imaging: No results found.  Recent Labs: Lab Results  Component Value Date   WBC 6.4 06/27/2023   HGB 14.0 06/27/2023   PLT 247 06/27/2023   NA 142 06/27/2023   NA 142 06/27/2023   K 4.1 06/27/2023   K 4.1 06/27/2023   CL 105 06/27/2023   CL 105 06/27/2023   CO2 30 06/27/2023   CO2 30 06/27/2023   GLUCOSE 87 06/27/2023   GLUCOSE 87 06/27/2023   BUN 13 06/27/2023   BUN 13 06/27/2023   CREATININE 0.81 06/27/2023   CREATININE 0.81 06/27/2023   BILITOT 0.4 06/27/2023   BILITOT 0.4 06/27/2023   ALKPHOS 92 03/31/2023   AST 81 (H) 06/27/2023   AST 81 (H) 06/27/2023   ALT 86 (H) 06/27/2023   ALT 86 (H) 06/27/2023   PROT 7.3 06/27/2023   PROT 7.3 06/27/2023   ALBUMIN 4.4 03/31/2023   CALCIUM 9.4 06/27/2023   CALCIUM 9.4 06/27/2023   December 16, 2023 anticardiolipin IgA  30.4, beta-2  GP 1 IgA 23.9, lupus anticoagulant negative, complements normal, dsDNA 16, TSH normal, vitamin D  73  Speciality Comments: No specialty comments available.  Procedures:  No procedures performed Allergies: Patient has no known allergies.   Assessment / Plan:     Visit Diagnoses: Positive ANA (antinuclear antibody) - 06/27/23 ANA 1:40 NS, dsDNA 14, C3-C4 normal, sed rate 14, anticardiolipin IgA 46.3, beta-2  GP 1 IgA 33.8, beta-2  GP 1 IgG 22.2, TPO antibody> 900, December 16, 2023 anticardiolipin IgA 30.4, beta-2  GP 1 IgA 23.9, lupus anticoagulant negative, complements normal, dsDNA 16 -lab results were reviewed with the patient.  Her antibody titers are low and stable.  She denies any history of fatigue, oral ulcers, nasal  ulcers, sicca symptoms, malar rash, photosensitivity, lymphadenopathy or inflammatory arthritis.  She states she has Raynaud's phenomenon for several years.  [place]  [place] have improved since she has moved here.  She had delayed capillary refill without any nailbed capillary changes or sclerodactyly.  No telangiectasia were noted.  I discussed the option of hydroxychloroquine but as she is asymptomatic she would like to hold off for now.  Will recheck labs in 6 months.  Plan: Protein / creatinine ratio, urine, CBC with Differential/Platelet, Comprehensive metabolic panel with GFR, ANA, Anti-DNA antibody, double-stranded, C3 and C4, Sedimentation rate, Beta-2  glycoprotein antibodies, Cardiolipin antibodies, IgG, IgM, IgA.  Advised her to contact me if she develops any new symptoms.  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  She is positive for low titer IgA anticardiolipin and beta-2  GP 1 antibody.  Association of these antibodies with increased risk of clotting was discussed.  Raynaud's disease without gangrene-patient gives history of mild Raynaud's symptoms during the winter  months.  She is currently asymptomatic.  She had mildly delayed capillary refill without any nailbed capillary changes or telangiectasia or sclerodactyly.  Keeping core temperature warm and warm clothing was discussed.  Elevated LFTs - She drinks 5 cans of beer daily.  Liver ultrasound showed hepatic steatosis.  She is going to follow-up with her GI.  Alcohol use - 5 cans of beer per day  Alopecia areata - Started at age 45.  Attention deficit disorder (ADD) without hyperactivity - she is on Adderall and Vyvanse .  Vitamin D  deficiency-vitamin D  was normal at 73 on December 16, 2023.  I advised her to take vitamin D  2000 units daily to prevent dips and vitamin D  levels.  Former smoker - 1 pack/day for 15 years.  She quit smoking 2017.  Orders: Orders Placed This Encounter  Procedures   Protein / creatinine ratio, urine   CBC with Differential/Platelet   Comprehensive metabolic panel with GFR   ANA   Anti-DNA antibody, double-stranded   C3 and C4   Sedimentation rate   Beta-2  glycoprotein antibodies   Cardiolipin antibodies, IgG, IgM, IgA   No orders of the defined types were placed in this encounter.    Follow-Up Instructions: Return in about 6 months (around 06/20/2024) for +ds DNA.   Maya Nash, MD  Note - This record has been created using Animal nutritionist.  Chart creation errors have been sought, but may not always  have been located. Such creation errors do not reflect on  the standard of medical care.

## 2023-12-16 ENCOUNTER — Other Ambulatory Visit: Payer: Self-pay | Admitting: *Deleted

## 2023-12-16 DIAGNOSIS — R760 Raised antibody titer: Secondary | ICD-10-CM

## 2023-12-16 DIAGNOSIS — R5383 Other fatigue: Secondary | ICD-10-CM

## 2023-12-16 DIAGNOSIS — E559 Vitamin D deficiency, unspecified: Secondary | ICD-10-CM

## 2023-12-16 DIAGNOSIS — R768 Other specified abnormal immunological findings in serum: Secondary | ICD-10-CM

## 2023-12-20 ENCOUNTER — Ambulatory Visit: Payer: Self-pay | Admitting: Rheumatology

## 2023-12-20 NOTE — Progress Notes (Signed)
 I will discuss results at the follow-up visit on December 22, 2023

## 2023-12-21 LAB — LUPUS ANTICOAGULANT EVAL W/ REFLEX
PTT-LA Screen: 30 s (ref <=40)
dRVVT: 31 s (ref <=45)

## 2023-12-21 LAB — TSH: TSH: 3.42 m[IU]/L

## 2023-12-21 LAB — BETA-2 GLYCOPROTEIN ANTIBODIES
Beta-2 Glyco 1 IgA: 23.9 U/mL — ABNORMAL HIGH (ref <20.0)
Beta-2 Glyco 1 IgM: 7.6 U/mL (ref <20.0)
Beta-2 Glyco I IgG: 19.3 U/mL (ref <20.0)

## 2023-12-21 LAB — CARDIOLIPIN ANTIBODIES, IGG, IGM, IGA
Anticardiolipin IgA: 30.4 [APL'U]/mL — ABNORMAL HIGH (ref <20.0)
Anticardiolipin IgG: 18.9 [GPL'U]/mL (ref <20.0)
Anticardiolipin IgM: 6.7 [MPL'U]/mL (ref <20.0)

## 2023-12-21 LAB — VITAMIN D 25 HYDROXY (VIT D DEFICIENCY, FRACTURES): Vit D, 25-Hydroxy: 73 ng/mL (ref 30–100)

## 2023-12-21 LAB — C3 AND C4
C3 Complement: 156 mg/dL (ref 83–193)
C4 Complement: 26 mg/dL (ref 15–57)

## 2023-12-21 LAB — ANTI-DNA ANTIBODY, DOUBLE-STRANDED: ds DNA Ab: 16 [IU]/mL — ABNORMAL HIGH

## 2023-12-21 NOTE — Progress Notes (Signed)
 I will discuss results at the follow-up visit tomorrow

## 2023-12-22 ENCOUNTER — Encounter: Payer: Self-pay | Admitting: Rheumatology

## 2023-12-22 ENCOUNTER — Ambulatory Visit: Attending: Rheumatology | Admitting: Rheumatology

## 2023-12-22 VITALS — BP 126/87 | HR 99 | Temp 98.1°F | Resp 14 | Ht 65.0 in | Wt 164.4 lb

## 2023-12-22 DIAGNOSIS — R7989 Other specified abnormal findings of blood chemistry: Secondary | ICD-10-CM

## 2023-12-22 DIAGNOSIS — R768 Other specified abnormal immunological findings in serum: Secondary | ICD-10-CM | POA: Diagnosis not present

## 2023-12-22 DIAGNOSIS — L639 Alopecia areata, unspecified: Secondary | ICD-10-CM

## 2023-12-22 DIAGNOSIS — I7300 Raynaud's syndrome without gangrene: Secondary | ICD-10-CM | POA: Diagnosis not present

## 2023-12-22 DIAGNOSIS — R5383 Other fatigue: Secondary | ICD-10-CM

## 2023-12-22 DIAGNOSIS — E559 Vitamin D deficiency, unspecified: Secondary | ICD-10-CM

## 2023-12-22 DIAGNOSIS — R760 Raised antibody titer: Secondary | ICD-10-CM

## 2023-12-22 DIAGNOSIS — F1090 Alcohol use, unspecified, uncomplicated: Secondary | ICD-10-CM

## 2023-12-22 DIAGNOSIS — F988 Other specified behavioral and emotional disorders with onset usually occurring in childhood and adolescence: Secondary | ICD-10-CM

## 2023-12-22 DIAGNOSIS — Z87891 Personal history of nicotine dependence: Secondary | ICD-10-CM

## 2023-12-22 NOTE — Patient Instructions (Addendum)
 Standing Labs We placed an order today for your standing lab work.   Please have your standing labs drawn in March  Please have your labs drawn 2 weeks prior to your appointment so that the provider can discuss your lab results at your appointment, if possible.  Please note that you may see your imaging and lab results in MyChart before we have reviewed them. We will contact you once all results are reviewed. Please allow our office up to 72 hours to thoroughly review all of the results before contacting the office for clarification of your results.  WALK-IN LAB HOURS  Monday through Thursday from 8:00 am -12:30 pm and 1:00 pm-4:30 pm and Friday from 8:00 am-12:00 pm.  Patients with office visits requiring labs will be seen before walk-in labs.  You may encounter longer than normal wait times. Please allow additional time. Wait times may be shorter on  Monday and Thursday afternoons.  We do not book appointments for walk-in labs. We appreciate your patience and understanding with our staff.   Labs are drawn by [facility]. Please bring your co-pay at the time of your lab draw.  You may receive a bill from [facility] for your lab work.  Please note if you are on Hydroxychloroquine and and an order has been placed for a Hydroxychloroquine level,  you will need to have it drawn 4 hours or more after your last dose.  If you wish to have your labs drawn at another location, please call the office 24 hours in advance so we can fax the orders.  The office is located at [address], [place], [place] [address]   If you have any questions regarding directions or hours of operation,  please call [phone].   As a reminder, please drink plenty of water prior to coming for your lab work. Thanks!

## 2023-12-29 ENCOUNTER — Other Ambulatory Visit

## 2023-12-29 DIAGNOSIS — F1090 Alcohol use, unspecified, uncomplicated: Secondary | ICD-10-CM

## 2023-12-29 DIAGNOSIS — Z202 Contact with and (suspected) exposure to infections with a predominantly sexual mode of transmission: Secondary | ICD-10-CM

## 2023-12-29 DIAGNOSIS — R945 Abnormal results of liver function studies: Secondary | ICD-10-CM

## 2023-12-30 ENCOUNTER — Ambulatory Visit: Payer: Self-pay | Admitting: Family Medicine

## 2023-12-30 LAB — CBC WITH DIFFERENTIAL/PLATELET
Basophils Absolute: 0.1 x10E3/uL (ref 0.0–0.2)
Basos: 1 %
EOS (ABSOLUTE): 0.2 x10E3/uL (ref 0.0–0.4)
Eos: 3 %
Hematocrit: 46.9 % — ABNORMAL HIGH (ref 34.0–46.6)
Hemoglobin: 14.9 g/dL (ref 11.1–15.9)
Immature Grans (Abs): 0 x10E3/uL (ref 0.0–0.1)
Immature Granulocytes: 0 %
Lymphocytes Absolute: 1.2 x10E3/uL (ref 0.7–3.1)
Lymphs: 26 %
MCH: 32 pg (ref 26.6–33.0)
MCHC: 31.8 g/dL (ref 31.5–35.7)
MCV: 101 fL — ABNORMAL HIGH (ref 79–97)
Monocytes Absolute: 0.4 x10E3/uL (ref 0.1–0.9)
Monocytes: 9 %
Neutrophils Absolute: 2.8 x10E3/uL (ref 1.4–7.0)
Neutrophils: 61 %
Platelets: 251 x10E3/uL (ref 150–450)
RBC: 4.66 x10E6/uL (ref 3.77–5.28)
RDW: 11.6 % — ABNORMAL LOW (ref 11.7–15.4)
WBC: 4.7 x10E3/uL (ref 3.4–10.8)

## 2023-12-30 LAB — COMPREHENSIVE METABOLIC PANEL WITH GFR
ALT: 23 IU/L (ref 0–32)
AST: 32 IU/L (ref 0–40)
Albumin: 4.4 g/dL (ref 3.9–4.9)
Alkaline Phosphatase: 96 IU/L (ref 41–116)
BUN/Creatinine Ratio: 12 (ref 9–23)
BUN: 10 mg/dL (ref 6–24)
Bilirubin Total: 0.4 mg/dL (ref 0.0–1.2)
CO2: 25 mmol/L (ref 20–29)
Calcium: 9.7 mg/dL (ref 8.7–10.2)
Chloride: 102 mmol/L (ref 96–106)
Creatinine, Ser: 0.85 mg/dL (ref 0.57–1.00)
Globulin, Total: 2.8 g/dL (ref 1.5–4.5)
Glucose: 72 mg/dL (ref 70–99)
Potassium: 3.9 mmol/L (ref 3.5–5.2)
Sodium: 142 mmol/L (ref 134–144)
Total Protein: 7.2 g/dL (ref 6.0–8.5)
eGFR: 84 mL/min/1.73 (ref >59)

## 2023-12-30 LAB — LIPID PANEL
Chol/HDL Ratio: 2.8 ratio (ref 0.0–4.4)
Cholesterol, Total: 214 mg/dL — ABNORMAL HIGH (ref 100–199)
HDL: 77 mg/dL (ref >39)
LDL Chol Calc (NIH): 122 mg/dL — ABNORMAL HIGH (ref 0–99)
Triglycerides: 83 mg/dL (ref 0–149)
VLDL Cholesterol Cal: 15 mg/dL (ref 5–40)

## 2023-12-30 LAB — HEMOGLOBIN A1C
Est. average glucose Bld gHb Est-mCnc: 97 mg/dL
Hgb A1c MFr Bld: 5 % (ref 4.8–5.6)

## 2023-12-30 LAB — HIV ANTIBODY (ROUTINE TESTING W REFLEX): HIV Screen 4th Generation wRfx: NONREACTIVE

## 2023-12-30 LAB — RPR: RPR Ser Ql: NONREACTIVE

## 2024-01-05 ENCOUNTER — Encounter: Payer: Self-pay | Admitting: Family Medicine

## 2024-01-05 ENCOUNTER — Ambulatory Visit (INDEPENDENT_AMBULATORY_CARE_PROVIDER_SITE_OTHER): Admitting: Family Medicine

## 2024-01-05 VITALS — BP 126/86 | HR 107 | Ht 65.0 in | Wt 167.4 lb

## 2024-01-05 DIAGNOSIS — Z0000 Encounter for general adult medical examination without abnormal findings: Secondary | ICD-10-CM | POA: Diagnosis not present

## 2024-01-05 DIAGNOSIS — F1090 Alcohol use, unspecified, uncomplicated: Secondary | ICD-10-CM | POA: Diagnosis not present

## 2024-01-05 DIAGNOSIS — L639 Alopecia areata, unspecified: Secondary | ICD-10-CM

## 2024-01-05 DIAGNOSIS — F988 Other specified behavioral and emotional disorders with onset usually occurring in childhood and adolescence: Secondary | ICD-10-CM

## 2024-01-05 DIAGNOSIS — R768 Other specified abnormal immunological findings in serum: Secondary | ICD-10-CM

## 2024-01-05 DIAGNOSIS — E782 Mixed hyperlipidemia: Secondary | ICD-10-CM

## 2024-01-05 MED ORDER — LISDEXAMFETAMINE DIMESYLATE 50 MG PO CAPS: 50.0000 mg | ORAL_CAPSULE | Freq: Every day | ORAL | 0 refills | Status: DC

## 2024-01-05 MED ORDER — AMPHETAMINE-DEXTROAMPHETAMINE 10 MG PO TABS: ORAL_TABLET | ORAL | 0 refills | Status: DC

## 2024-01-05 NOTE — Patient Instructions (Signed)
 It was nice to see you today,  We addressed the following topics today: -Your liver function tests are much better.  Continue with the reduced alcohol intake. - Your cholesterol levels were slightly elevated.  Saturated fat from red meat including lamb beef and pork, and dairy products like cheese and butter are typically what causes increased cholesterol. - I will look into seeing if any additional testing is needed for your elevated TPO and order that if necessary.  Your thyroid  levels are normal so we will continue to monitor for now.  Have a great day,  Rolan Slain, MD

## 2024-01-05 NOTE — Progress Notes (Signed)
 Annual physical  Subjective    Patient ID: Kristen Hanna, female    DOB: [birth year]  Age: 47 y.o. MRN: [id number]  Chief Complaint  Patient presents with   Annual Exam   HPI Kristen Hanna is a 48 y.o. old female here  for annual exam.   Subjective - Concern regarding elevated thyroperoxidase antibody test from March. Value was over 900, with normal being below 9. Result was ordered by rheumatologist, Dr. Monna. Also discussed anticardiolipin antibody. Rheumatology follow-up in May and September. Rheumatology plan was to repeat labs in 3 months (in May) and then 6 months (in September). Discussed Plaquenil (hydroxychloroquine) with rheumatology but deferred as asymptomatic. Rheumatology to recheck beta-2  glycoprotein antibodies, cardiolipin antibodies (IgG, IgM, IgA), and lupus anticoagulant eval. - Concern regarding elevated cholesterol. LDL was 122, previously 98. Discussed diet, specifically increased intake of sunflower seeds, deli meat, and cheese. Reports not eating pork, but eats beef. Inquired about lamb as an alternative. - Reports significant reduction in alcohol intake, from 4-5 cans per day to 1-2 cans per day. - Reports alopecia areata since young age, with intermittent patches of hair loss. Complete hair loss in 2011 which has not regrown. Wears a wig.  Medications: Vyvanse  and Adderall for ADHD. Reports Vyvanse  is working well until about 3 PM, then takes Adderall. Adderall has not been as effective recently. Plans to continue current dose for 3 months to assess effects of dietary changes.  PMH: Alopecia areata since age 46. Elevated cholesterol. ADHD. Positive ANA. Elevated anticardiolipin antibody. Elevated TPO antibodies. PSH: None mentioned. FH: Mother and sister with Hashimoto's disease. Social Hx: Reports significant reduction in alcohol consumption. 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 Has children in school. Denies desire for flu vaccine at this  time.  ROS: Asymptomatic from autoimmune perspective. Otherwise, no new concerns reported.    The 10-year ASCVD risk score (Arnett DK, et al., 2019) is: 0.7%  Health Maintenance Due  Topic Date Due   Hepatitis B Vaccines 19-59 Average Risk (1 of 3 - 19+ 3-dose series) Never done   COVID-19 Vaccine (3 - Pfizer risk series) 10/30/2019   Influenza Vaccine  Never done      Objective:     BP 126/86   Pulse (!) 107   Ht 5' 5 (1.651 m)   Wt 167 lb 6.4 oz (75.9 kg)   SpO2 97%   BMI 27.86 kg/m    Physical Exam Gen: alert, oriented HEENT: perrla, eomi, mmm CV: rrr, no murmur Pulm: lctab. No wheeze or crackles.  GI: soft, nbs.  Nontender to palpation MSK: strength equal b/l. Normal gait Ext: no pedal edema Skin: warm and dry, no rashes Psych: pleasant affect.  Spontaneous speech   No results found for any visits on 01/05/24.      Assessment & Plan:   Physical exam, annual  Attention deficit disorder (ADD) without hyperactivity Assessment & Plan: Managed with Vyvanse  and Adderall. Reports Adderall is less effective recently. - Continue current medication regimen for 3 months to assess impact of lifestyle changes. Re-evaluate at that time.  Orders: -     Amphetamine -Dextroamphetamine ; TAKE 1/2 TO 1 TABLET BY MOUTH DAILY AS NEEDED.  Dispense: 30 tablet; Refill: 0 -     Amphetamine -Dextroamphetamine ; TAKE 1/2 TO 1 TABLET BY MOUTH DAILY AS NEEDED.  Dispense: 30 tablet; Refill: 0 -     Amphetamine -Dextroamphetamine ; TAKE 1/2 TO 1 TABLET BY MOUTH DAILY AS NEEDED.  Dispense: 30 tablet; Refill: 0  Anti-TPO antibodies present Assessment & Plan: History of elevated TPO antibodies found on autoimmune workup by rheumatology. Thyroid  function tests (TSH) have been normal. Family history of Hashimoto's disease. Asymptomatic from a thyroid  perspective. - Will research recommendations for elevated TPO antibodies with normal thyroid  function and order additional testing if  indicated. - Continue to monitor thyroid  levels periodically.  Orders: -     TSH+T3+ThyAbs+TPO Ab+TRAb+T...; Future  Alcohol use Assessment & Plan: Reports significant decrease in alcohol consumption. Liver function tests have improved. - Continue with reduced alcohol intake.   Moderate mixed hyperlipidemia not requiring statin therapy Assessment & Plan: Mildly elevated LDL cholesterol at 122. Discussed dietary contributors. - Counseled on dietary sources of saturated fat, including red meat (beef, lamb, pork) and dairy products (cheese, butter). - Advised that leaner cuts of meat are preferable.   Alopecia areata  Other orders -     Lisdexamfetamine Dimesylate ; Take 1 capsule (50 mg total) by mouth daily.  Dispense: 30 capsule; Refill: 0 -     Lisdexamfetamine Dimesylate ; Take 1 capsule (50 mg total) by mouth daily.  Dispense: 30 capsule; Refill: 0 -     Lisdexamfetamine Dimesylate ; Take 1 capsule (50 mg total) by mouth daily.  Dispense: 30 capsule; Refill: 0     Return in about 3 months (around 04/05/2024) for adhd.    Kristen MARLA Slain, MD

## 2024-01-08 ENCOUNTER — Encounter: Payer: Self-pay | Admitting: Family Medicine

## 2024-01-08 DIAGNOSIS — L639 Alopecia areata, unspecified: Secondary | ICD-10-CM | POA: Insufficient documentation

## 2024-01-08 DIAGNOSIS — E785 Hyperlipidemia, unspecified: Secondary | ICD-10-CM | POA: Insufficient documentation

## 2024-01-08 DIAGNOSIS — R768 Other specified abnormal immunological findings in serum: Secondary | ICD-10-CM | POA: Insufficient documentation

## 2024-01-08 NOTE — Assessment & Plan Note (Signed)
 History of elevated TPO antibodies found on autoimmune workup by rheumatology. Thyroid  function tests (TSH) have been normal. Family history of Hashimoto's disease. Asymptomatic from a thyroid  perspective. - Will research recommendations for elevated TPO antibodies with normal thyroid  function and order additional testing if indicated. - Continue to monitor thyroid  levels periodically.

## 2024-01-08 NOTE — Assessment & Plan Note (Signed)
 Mildly elevated LDL cholesterol at 122. Discussed dietary contributors. - Counseled on dietary sources of saturated fat, including red meat (beef, lamb, pork) and dairy products (cheese, butter). - Advised that leaner cuts of meat are preferable.

## 2024-01-08 NOTE — Assessment & Plan Note (Signed)
 Reports significant decrease in alcohol consumption. Liver function tests have improved. - Continue with reduced alcohol intake.

## 2024-01-08 NOTE — Assessment & Plan Note (Signed)
 Managed with Vyvanse  and Adderall. Reports Adderall is less effective recently. - Continue current medication regimen for 3 months to assess impact of lifestyle changes. Re-evaluate at that time.

## 2024-01-20 ENCOUNTER — Encounter: Payer: Self-pay | Admitting: Family Medicine

## 2024-04-09 ENCOUNTER — Ambulatory Visit: Admitting: Family Medicine

## 2024-04-09 DIAGNOSIS — F988 Other specified behavioral and emotional disorders with onset usually occurring in childhood and adolescence: Secondary | ICD-10-CM

## 2024-04-09 DIAGNOSIS — R7689 Other specified abnormal immunological findings in serum: Secondary | ICD-10-CM

## 2024-04-09 MED ORDER — LISDEXAMFETAMINE DIMESYLATE 50 MG PO CAPS: 50.0000 mg | ORAL_CAPSULE | Freq: Every day | ORAL | 0 refills | Status: AC

## 2024-04-09 MED ORDER — AMPHETAMINE-DEXTROAMPHETAMINE 10 MG PO TABS: ORAL_TABLET | ORAL | 0 refills | Status: AC

## 2024-04-09 NOTE — Progress Notes (Unsigned)
" ° °  Established Patient Office Visit  Subjective   Patient ID: Kristen Hanna, female    DOB: [birth year]  Age: 48 y.o. MRN: [id number]  Chief Complaint  Patient presents with   Medical Management of Chronic Issues     History of Present Illness   Kristen Hanna is a 48 year old female who presents for follow-up on thyroid  antibody testing.  She has a history of elevated thyroid  peroxidase (TPO) antibodies, discovered incidentally, but her thyroid  levels have remained normal.  Her father recently passed away from prostate cancer. He was diagnosed in February of the previous year and given a prognosis of six to eight weeks, but he lived until December.  No new symptoms or concerns.          The 10-year ASCVD risk score (Arnett DK, et al., 2019) is: 0.7%  Health Maintenance Due  Topic Date Due   Hepatitis B Vaccines 19-59 Average Risk (1 of 3 - 19+ 3-dose series) Never done   COVID-19 Vaccine (3 - Pfizer risk series) 10/30/2019      Objective:     BP 125/87   Pulse 99   Ht 5' 5 (1.651 m)   Wt 173 lb 6.4 oz (78.7 kg)   SpO2 99%   BMI 28.86 kg/m  {Vitals History (Optional):23777}  Physical Exam     Gen: alert, oriented Pulm: no respiratory distress Psych: pleasant affect       No results found for any visits on 04/09/24.      Assessment & Plan:   Anti-TPO antibodies present Assessment & Plan: Elevated TPO antibody with normal thyroid  levels. Further evaluation needed. - Ordered repeat TSH, T3, T4, thyroid  antibody, and TPO antibody tests. - Sent blood samples to lab for thyroid  function tests.   Orders: -     TSH+T3+ThyAbs+TPO Ab+TRAb+T...  Attention deficit disorder (ADD) without hyperactivity -     Amphetamine -Dextroamphetamine ; TAKE 1/2 TO 1 TABLET BY MOUTH DAILY AS NEEDED.  Dispense: 30 tablet; Refill: 0 -     Amphetamine -Dextroamphetamine ; TAKE 1/2 TO 1 TABLET BY MOUTH DAILY AS NEEDED.  Dispense: 30 tablet; Refill: 0 -      Amphetamine -Dextroamphetamine ; TAKE 1/2 TO 1 TABLET BY MOUTH DAILY AS NEEDED.  Dispense: 30 tablet; Refill: 0  Other orders -     Lisdexamfetamine  Dimesylate; Take 1 capsule (50 mg total) by mouth daily.  Dispense: 30 capsule; Refill: 0 -     Lisdexamfetamine  Dimesylate; Take 1 capsule (50 mg total) by mouth daily.  Dispense: 30 capsule; Refill: 0 -     Lisdexamfetamine  Dimesylate; Take 1 capsule (50 mg total) by mouth daily.  Dispense: 30 capsule; Refill: 0          Return in about 4 months (around 08/08/2024) for adhd, thyroid .    [name], MD  "

## 2024-04-09 NOTE — Patient Instructions (Signed)
 It was nice to see you today,  We addressed the following topics today: - I will let you know the results of your thyroid  labs when I get them.    Have a great day,  Rolan Slain, MD

## 2024-04-09 NOTE — Assessment & Plan Note (Signed)
 Elevated TPO antibody with normal thyroid  levels. Further evaluation needed. - Ordered repeat TSH, T3, T4, thyroid  antibody, and TPO antibody tests. - Sent blood samples to lab for thyroid  function tests.

## 2024-04-11 NOTE — Assessment & Plan Note (Signed)
 No concerns. Doing well w/ current dose. Continue vyvanse  50mg  am, adderall 10mg  in afternoon as needed.

## 2024-04-16 ENCOUNTER — Ambulatory Visit: Payer: Self-pay | Admitting: Family Medicine

## 2024-04-19 LAB — TSH+T3+THYABS+TPO AB+TRAB+T...
Anti-Thyroglobulin Antibodies: 2.1 [IU]/mL — ABNORMAL HIGH
Anti-Thyroid Peroxidase Ab: 1013 [IU]/mL — ABNORMAL HIGH
Free T-3: 3.5 pg/mL
Free T4 by Dialysis: 1.3 ng/dL
Reverse T3,  LCMS Endo Sci: 18.1 ng/dL
TSH Receptor Antibody (TBII): <0.3 U/L
TSH: 4.7 uU/mL — ABNORMAL HIGH
Triiodothyronine (T-3), Serum: 114 ng/dL

## 2024-06-20 ENCOUNTER — Ambulatory Visit: Admitting: Rheumatology

## 2024-08-08 ENCOUNTER — Ambulatory Visit: Admitting: Family Medicine

## 7363-07-12 DEATH — deceased
